# Patient Record
Sex: Female | Born: 1935 | Race: White | Hispanic: No | Smoking: Never smoker
Health system: Southern US, Community
[De-identification: ages and names within clinical notes are randomized; demographics above are authoritative.]

## PROBLEM LIST (undated history)

## (undated) DIAGNOSIS — Z923 Personal history of irradiation: Secondary | ICD-10-CM

## (undated) DIAGNOSIS — C50919 Malignant neoplasm of unspecified site of unspecified female breast: Secondary | ICD-10-CM

## (undated) DIAGNOSIS — M1990 Unspecified osteoarthritis, unspecified site: Secondary | ICD-10-CM

## (undated) DIAGNOSIS — R600 Localized edema: Secondary | ICD-10-CM

## (undated) DIAGNOSIS — G4730 Sleep apnea, unspecified: Secondary | ICD-10-CM

## (undated) DIAGNOSIS — C801 Malignant (primary) neoplasm, unspecified: Secondary | ICD-10-CM

## (undated) DIAGNOSIS — Z9889 Other specified postprocedural states: Secondary | ICD-10-CM

## (undated) DIAGNOSIS — I10 Essential (primary) hypertension: Secondary | ICD-10-CM

## (undated) DIAGNOSIS — R0600 Dyspnea, unspecified: Secondary | ICD-10-CM

## (undated) DIAGNOSIS — I2510 Atherosclerotic heart disease of native coronary artery without angina pectoris: Secondary | ICD-10-CM

## (undated) DIAGNOSIS — N289 Disorder of kidney and ureter, unspecified: Secondary | ICD-10-CM

## (undated) DIAGNOSIS — D649 Anemia, unspecified: Secondary | ICD-10-CM

## (undated) DIAGNOSIS — K219 Gastro-esophageal reflux disease without esophagitis: Secondary | ICD-10-CM

## (undated) HISTORY — DX: Other specified postprocedural states: Z98.890

## (undated) HISTORY — PX: BREAST LUMPECTOMY: SHX2

## (undated) HISTORY — DX: Essential (primary) hypertension: I10

## (undated) HISTORY — PX: ABDOMINAL HYSTERECTOMY: SHX81

## (undated) HISTORY — DX: Gastro-esophageal reflux disease without esophagitis: K21.9

## (undated) HISTORY — DX: Disorder of kidney and ureter, unspecified: N28.9

## (undated) HISTORY — PX: CAROTID STENT: SHX1301

## (undated) HISTORY — DX: Anemia, unspecified: D64.9

## (undated) HISTORY — DX: Unspecified osteoarthritis, unspecified site: M19.90

## (undated) HISTORY — PX: MASTECTOMY: SHX3

---

## 1959-05-24 DIAGNOSIS — C801 Malignant (primary) neoplasm, unspecified: Secondary | ICD-10-CM

## 1959-05-24 HISTORY — DX: Malignant (primary) neoplasm, unspecified: C80.1

## 1959-05-24 HISTORY — PX: PARTIAL HYSTERECTOMY: SHX80

## 2004-03-25 ENCOUNTER — Emergency Department: Payer: Self-pay | Admitting: Emergency Medicine

## 2004-08-16 ENCOUNTER — Ambulatory Visit: Payer: Self-pay

## 2004-12-28 ENCOUNTER — Ambulatory Visit: Payer: Self-pay | Admitting: Family Medicine

## 2006-03-09 ENCOUNTER — Ambulatory Visit: Payer: Self-pay | Admitting: Family Medicine

## 2007-04-12 ENCOUNTER — Ambulatory Visit: Payer: Self-pay | Admitting: Family Medicine

## 2007-05-08 ENCOUNTER — Ambulatory Visit: Payer: Self-pay | Admitting: Family Medicine

## 2007-05-24 HISTORY — PX: CATARACT EXTRACTION W/ INTRAOCULAR LENS  IMPLANT, BILATERAL: SHX1307

## 2007-05-24 HISTORY — PX: EYE SURGERY: SHX253

## 2007-06-14 ENCOUNTER — Other Ambulatory Visit: Payer: Self-pay

## 2007-06-14 ENCOUNTER — Inpatient Hospital Stay: Payer: Self-pay | Admitting: Internal Medicine

## 2007-09-18 ENCOUNTER — Ambulatory Visit: Payer: Self-pay | Admitting: Family Medicine

## 2007-11-13 ENCOUNTER — Ambulatory Visit: Payer: Self-pay | Admitting: Family Medicine

## 2008-02-07 ENCOUNTER — Ambulatory Visit: Payer: Self-pay | Admitting: Cardiology

## 2008-02-07 ENCOUNTER — Ambulatory Visit: Payer: Self-pay | Admitting: Ophthalmology

## 2008-02-07 ENCOUNTER — Other Ambulatory Visit: Payer: Self-pay

## 2008-02-19 ENCOUNTER — Ambulatory Visit: Payer: Self-pay | Admitting: Ophthalmology

## 2008-03-19 ENCOUNTER — Ambulatory Visit: Payer: Self-pay | Admitting: Ophthalmology

## 2008-04-01 ENCOUNTER — Ambulatory Visit: Payer: Self-pay | Admitting: Ophthalmology

## 2009-03-31 ENCOUNTER — Ambulatory Visit: Payer: Self-pay | Admitting: Family Medicine

## 2009-09-15 ENCOUNTER — Ambulatory Visit: Payer: Self-pay | Admitting: Gastroenterology

## 2009-09-20 ENCOUNTER — Ambulatory Visit: Payer: Self-pay | Admitting: Internal Medicine

## 2009-10-08 ENCOUNTER — Ambulatory Visit: Payer: Self-pay | Admitting: Gastroenterology

## 2009-10-16 ENCOUNTER — Ambulatory Visit: Payer: Self-pay | Admitting: Internal Medicine

## 2009-10-21 ENCOUNTER — Ambulatory Visit: Payer: Self-pay | Admitting: Internal Medicine

## 2009-11-20 ENCOUNTER — Ambulatory Visit: Payer: Self-pay | Admitting: Internal Medicine

## 2010-04-22 ENCOUNTER — Ambulatory Visit: Payer: Self-pay | Admitting: Internal Medicine

## 2010-08-05 ENCOUNTER — Ambulatory Visit: Payer: Self-pay | Admitting: Internal Medicine

## 2010-10-11 ENCOUNTER — Ambulatory Visit: Payer: Self-pay | Admitting: Internal Medicine

## 2010-10-29 ENCOUNTER — Ambulatory Visit: Payer: Self-pay | Admitting: Gastroenterology

## 2010-11-04 ENCOUNTER — Ambulatory Visit: Payer: Self-pay | Admitting: Gastroenterology

## 2011-05-25 ENCOUNTER — Ambulatory Visit: Payer: Self-pay | Admitting: Internal Medicine

## 2012-06-26 ENCOUNTER — Ambulatory Visit: Payer: Self-pay | Admitting: Internal Medicine

## 2013-07-22 ENCOUNTER — Ambulatory Visit: Payer: Self-pay | Admitting: Internal Medicine

## 2013-08-02 ENCOUNTER — Ambulatory Visit: Payer: Self-pay | Admitting: Internal Medicine

## 2013-11-01 DIAGNOSIS — D649 Anemia, unspecified: Secondary | ICD-10-CM | POA: Insufficient documentation

## 2013-11-01 DIAGNOSIS — R609 Edema, unspecified: Secondary | ICD-10-CM | POA: Insufficient documentation

## 2013-11-01 DIAGNOSIS — R51 Headache: Secondary | ICD-10-CM

## 2013-11-01 DIAGNOSIS — Z90710 Acquired absence of both cervix and uterus: Secondary | ICD-10-CM | POA: Insufficient documentation

## 2013-11-01 DIAGNOSIS — G8929 Other chronic pain: Secondary | ICD-10-CM | POA: Insufficient documentation

## 2013-11-01 DIAGNOSIS — I10 Essential (primary) hypertension: Secondary | ICD-10-CM | POA: Insufficient documentation

## 2013-11-01 DIAGNOSIS — B0229 Other postherpetic nervous system involvement: Secondary | ICD-10-CM | POA: Insufficient documentation

## 2013-11-01 DIAGNOSIS — M069 Rheumatoid arthritis, unspecified: Secondary | ICD-10-CM | POA: Insufficient documentation

## 2013-11-01 DIAGNOSIS — E785 Hyperlipidemia, unspecified: Secondary | ICD-10-CM | POA: Insufficient documentation

## 2013-11-01 DIAGNOSIS — K219 Gastro-esophageal reflux disease without esophagitis: Secondary | ICD-10-CM | POA: Insufficient documentation

## 2013-11-01 DIAGNOSIS — Z87440 Personal history of urinary (tract) infections: Secondary | ICD-10-CM | POA: Insufficient documentation

## 2013-11-01 DIAGNOSIS — R600 Localized edema: Secondary | ICD-10-CM | POA: Insufficient documentation

## 2013-11-01 DIAGNOSIS — M0579 Rheumatoid arthritis with rheumatoid factor of multiple sites without organ or systems involvement: Secondary | ICD-10-CM | POA: Insufficient documentation

## 2013-12-27 ENCOUNTER — Ambulatory Visit: Payer: Self-pay | Admitting: Nephrology

## 2014-02-04 ENCOUNTER — Ambulatory Visit: Payer: Self-pay | Admitting: Internal Medicine

## 2014-02-06 DIAGNOSIS — R51 Headache: Secondary | ICD-10-CM

## 2014-02-06 DIAGNOSIS — R200 Anesthesia of skin: Secondary | ICD-10-CM | POA: Insufficient documentation

## 2014-02-06 DIAGNOSIS — R519 Headache, unspecified: Secondary | ICD-10-CM | POA: Insufficient documentation

## 2014-03-06 DIAGNOSIS — G44221 Chronic tension-type headache, intractable: Secondary | ICD-10-CM | POA: Insufficient documentation

## 2014-04-30 ENCOUNTER — Ambulatory Visit: Payer: Self-pay | Admitting: Vascular Surgery

## 2014-05-09 DIAGNOSIS — N184 Chronic kidney disease, stage 4 (severe): Secondary | ICD-10-CM | POA: Insufficient documentation

## 2014-05-09 DIAGNOSIS — R0789 Other chest pain: Secondary | ICD-10-CM | POA: Insufficient documentation

## 2014-05-22 ENCOUNTER — Ambulatory Visit: Payer: Self-pay | Admitting: Cardiology

## 2014-05-23 HISTORY — PX: CAROTID STENT: SHX1301

## 2014-05-27 ENCOUNTER — Inpatient Hospital Stay: Payer: Self-pay | Admitting: Vascular Surgery

## 2014-05-27 DIAGNOSIS — E785 Hyperlipidemia, unspecified: Secondary | ICD-10-CM | POA: Diagnosis not present

## 2014-05-27 DIAGNOSIS — Z8673 Personal history of transient ischemic attack (TIA), and cerebral infarction without residual deficits: Secondary | ICD-10-CM | POA: Diagnosis not present

## 2014-05-27 DIAGNOSIS — Z7982 Long term (current) use of aspirin: Secondary | ICD-10-CM | POA: Diagnosis not present

## 2014-05-27 DIAGNOSIS — Z882 Allergy status to sulfonamides status: Secondary | ICD-10-CM | POA: Diagnosis not present

## 2014-05-27 DIAGNOSIS — I6522 Occlusion and stenosis of left carotid artery: Secondary | ICD-10-CM | POA: Diagnosis not present

## 2014-05-27 DIAGNOSIS — I2510 Atherosclerotic heart disease of native coronary artery without angina pectoris: Secondary | ICD-10-CM | POA: Diagnosis not present

## 2014-05-27 DIAGNOSIS — I959 Hypotension, unspecified: Secondary | ICD-10-CM | POA: Diagnosis not present

## 2014-05-27 DIAGNOSIS — I6529 Occlusion and stenosis of unspecified carotid artery: Secondary | ICD-10-CM | POA: Diagnosis not present

## 2014-05-27 DIAGNOSIS — Z885 Allergy status to narcotic agent status: Secondary | ICD-10-CM | POA: Diagnosis not present

## 2014-05-27 DIAGNOSIS — I639 Cerebral infarction, unspecified: Secondary | ICD-10-CM | POA: Diagnosis not present

## 2014-05-27 DIAGNOSIS — I129 Hypertensive chronic kidney disease with stage 1 through stage 4 chronic kidney disease, or unspecified chronic kidney disease: Secondary | ICD-10-CM | POA: Diagnosis not present

## 2014-05-27 LAB — BASIC METABOLIC PANEL
ANION GAP: 9 (ref 7–16)
BUN: 35 mg/dL — ABNORMAL HIGH (ref 7–18)
CO2: 24 mmol/L (ref 21–32)
Calcium, Total: 8.2 mg/dL — ABNORMAL LOW (ref 8.5–10.1)
Chloride: 106 mmol/L (ref 98–107)
Creatinine: 2.09 mg/dL — ABNORMAL HIGH (ref 0.60–1.30)
EGFR (African American): 30 — ABNORMAL LOW
EGFR (Non-African Amer.): 24 — ABNORMAL LOW
Glucose: 89 mg/dL (ref 65–99)
Osmolality: 285 (ref 275–301)
Potassium: 4.6 mmol/L (ref 3.5–5.1)
Sodium: 139 mmol/L (ref 136–145)

## 2014-05-28 LAB — CBC WITH DIFFERENTIAL/PLATELET
Basophil #: 0 10*3/uL (ref 0.0–0.1)
Basophil %: 0.6 %
EOS PCT: 2 %
Eosinophil #: 0.1 10*3/uL (ref 0.0–0.7)
HCT: 25.3 % — AB (ref 35.0–47.0)
HGB: 8.1 g/dL — ABNORMAL LOW (ref 12.0–16.0)
Lymphocyte #: 1 10*3/uL (ref 1.0–3.6)
Lymphocyte %: 14.3 %
MCH: 28.1 pg (ref 26.0–34.0)
MCHC: 31.9 g/dL — AB (ref 32.0–36.0)
MCV: 88 fL (ref 80–100)
MONOS PCT: 7.8 %
Monocyte #: 0.5 x10 3/mm (ref 0.2–0.9)
NEUTROS ABS: 5 10*3/uL (ref 1.4–6.5)
Neutrophil %: 75.3 %
Platelet: 194 10*3/uL (ref 150–440)
RBC: 2.88 10*6/uL — ABNORMAL LOW (ref 3.80–5.20)
RDW: 15 % — AB (ref 11.5–14.5)
WBC: 6.7 10*3/uL (ref 3.6–11.0)

## 2014-05-28 LAB — BASIC METABOLIC PANEL
ANION GAP: 7 (ref 7–16)
BUN: 27 mg/dL — AB (ref 7–18)
Calcium, Total: 7.4 mg/dL — ABNORMAL LOW (ref 8.5–10.1)
Chloride: 106 mmol/L (ref 98–107)
Co2: 29 mmol/L (ref 21–32)
Creatinine: 1.91 mg/dL — ABNORMAL HIGH (ref 0.60–1.30)
EGFR (Non-African Amer.): 27 — ABNORMAL LOW
GFR CALC AF AMER: 33 — AB
Glucose: 99 mg/dL (ref 65–99)
OSMOLALITY: 288 (ref 275–301)
Potassium: 4.5 mmol/L (ref 3.5–5.1)
Sodium: 142 mmol/L (ref 136–145)

## 2014-05-28 LAB — PROTIME-INR
INR: 1.2
PROTHROMBIN TIME: 15.4 s — AB (ref 11.5–14.7)

## 2014-05-28 LAB — APTT: Activated PTT: 31.6 secs (ref 23.6–35.9)

## 2014-06-02 DIAGNOSIS — I10 Essential (primary) hypertension: Secondary | ICD-10-CM | POA: Diagnosis not present

## 2014-06-02 DIAGNOSIS — R0789 Other chest pain: Secondary | ICD-10-CM | POA: Diagnosis not present

## 2014-06-02 DIAGNOSIS — I779 Disorder of arteries and arterioles, unspecified: Secondary | ICD-10-CM | POA: Diagnosis not present

## 2014-06-02 DIAGNOSIS — E782 Mixed hyperlipidemia: Secondary | ICD-10-CM | POA: Diagnosis not present

## 2014-06-06 DIAGNOSIS — E538 Deficiency of other specified B group vitamins: Secondary | ICD-10-CM | POA: Diagnosis not present

## 2014-07-08 DIAGNOSIS — E538 Deficiency of other specified B group vitamins: Secondary | ICD-10-CM | POA: Diagnosis not present

## 2014-07-09 DIAGNOSIS — G479 Sleep disorder, unspecified: Secondary | ICD-10-CM | POA: Diagnosis not present

## 2014-07-09 DIAGNOSIS — R5382 Chronic fatigue, unspecified: Secondary | ICD-10-CM | POA: Insufficient documentation

## 2014-07-09 DIAGNOSIS — R51 Headache: Secondary | ICD-10-CM | POA: Diagnosis not present

## 2014-07-09 DIAGNOSIS — I6522 Occlusion and stenosis of left carotid artery: Secondary | ICD-10-CM | POA: Diagnosis not present

## 2014-07-14 DIAGNOSIS — N2581 Secondary hyperparathyroidism of renal origin: Secondary | ICD-10-CM | POA: Diagnosis not present

## 2014-07-14 DIAGNOSIS — R809 Proteinuria, unspecified: Secondary | ICD-10-CM | POA: Diagnosis not present

## 2014-07-14 DIAGNOSIS — I10 Essential (primary) hypertension: Secondary | ICD-10-CM | POA: Diagnosis not present

## 2014-07-14 DIAGNOSIS — D631 Anemia in chronic kidney disease: Secondary | ICD-10-CM | POA: Diagnosis not present

## 2014-07-14 DIAGNOSIS — N184 Chronic kidney disease, stage 4 (severe): Secondary | ICD-10-CM | POA: Diagnosis not present

## 2014-07-18 DIAGNOSIS — K219 Gastro-esophageal reflux disease without esophagitis: Secondary | ICD-10-CM | POA: Diagnosis not present

## 2014-07-18 DIAGNOSIS — D649 Anemia, unspecified: Secondary | ICD-10-CM | POA: Diagnosis not present

## 2014-07-18 DIAGNOSIS — R531 Weakness: Secondary | ICD-10-CM | POA: Diagnosis not present

## 2014-07-18 DIAGNOSIS — N184 Chronic kidney disease, stage 4 (severe): Secondary | ICD-10-CM | POA: Diagnosis not present

## 2014-08-01 DIAGNOSIS — D649 Anemia, unspecified: Secondary | ICD-10-CM | POA: Diagnosis not present

## 2014-08-12 DIAGNOSIS — I779 Disorder of arteries and arterioles, unspecified: Secondary | ICD-10-CM | POA: Diagnosis not present

## 2014-08-12 DIAGNOSIS — D508 Other iron deficiency anemias: Secondary | ICD-10-CM | POA: Diagnosis not present

## 2014-08-12 DIAGNOSIS — R5382 Chronic fatigue, unspecified: Secondary | ICD-10-CM | POA: Diagnosis not present

## 2014-08-12 DIAGNOSIS — Z79899 Other long term (current) drug therapy: Secondary | ICD-10-CM | POA: Diagnosis not present

## 2014-08-12 DIAGNOSIS — I10 Essential (primary) hypertension: Secondary | ICD-10-CM | POA: Diagnosis not present

## 2014-08-12 DIAGNOSIS — E538 Deficiency of other specified B group vitamins: Secondary | ICD-10-CM | POA: Diagnosis not present

## 2014-08-12 DIAGNOSIS — G44221 Chronic tension-type headache, intractable: Secondary | ICD-10-CM | POA: Diagnosis not present

## 2014-08-12 DIAGNOSIS — G479 Sleep disorder, unspecified: Secondary | ICD-10-CM | POA: Diagnosis not present

## 2014-09-12 DIAGNOSIS — E538 Deficiency of other specified B group vitamins: Secondary | ICD-10-CM | POA: Diagnosis not present

## 2014-09-21 NOTE — Op Note (Signed)
PATIENT NAME:  Rachel Jones, Rachel Jones MR#:  [id number] DATE OF BIRTH:  1933/12/19  DATE OF PROCEDURE:  05/27/2014  PREOPERATIVE DIAGNOSES:  1.  Critical stenosis of the left internal carotid artery, symptomatic.  2.  History of cerebrovascular accident approximately 2 months ago.   POSTOPERATIVE DIAGNOSES:  1.  Critical stenosis of the left internal carotid artery, symptomatic.  2.  History of cerebrovascular accident approximately 2 months ago.   PROCEDURES PERFORMED: 1.  Arch aortogram.  2.  Selective left carotid artery angiography, both cervical and cerebral views.  3.  Percutaneous transluminal angioplasty and stent placement, left internal carotid artery.   SURGEON: Rachel Pilar, MD  ASSISTANT: Dr. Lucky Jones  SEDATION: Versed 1 mg plus fentanyl 50 mcg administered IV. Continuous ECG, pulse oximetry and cardiopulmonary monitoring is performed throughout the entire procedure by the interventional radiology nurse. Total sedation time is approximately 1 hour.   ACCESS: A 6-French sheath, right common femoral artery.   CONTRAST USED: Isovue 50 mL.   FLUOROSCOPY TIME: 10.9 minutes.   INDICATIONS: Rachel Jones is a 81 year old woman who presented to [facility] approximately 2 months ago with stroke symptoms. Confirmation by MRI for left hemispheric CVA was made and work-up demonstrated critical stenosis of the left internal carotid artery. The risks and benefits for angiography in the hope for intervention given her symptomatic status, her poor cardiac status, poor pulmonary status and poor renal status was reviewed. All questions have been answered. Noncontrasted CT scan has been used to evaluate the left carotid and there is minimal calcifications suggesting the lesion should be amenable to intervention. Again, risks and benefits were reviewed with the patient and family. All alternative therapies were discussed. All questions have been answered. The patient has agreed to proceed with angiography and  stenting.   DESCRIPTION OF PROCEDURE: The patient is taken to special procedures and placed in the supine position. After adequate monitors are placed, she is positioned supine and her groins are prepped and draped in a sterile fashion. Appropriate timeout is called.   Right groin is then evaluated with ultrasound. Common femoral artery is identified. It is echolucent and pulsatile indicating patency. Image is recorded for the permanent record and under real-time visualization, after 1% lidocaine has been infiltrated, a microneedle is inserted, microwire followed by microsheath, J-wire followed by 5-French sheath and 5-French pigtail catheter. Pigtail catheter is advanced into the ascending aorta and a LAO projection of the arch is obtained. After review of the images, the pigtail catheter is exchanged over a stiff angled Glidewire for a JB-1 catheter and the left common carotid is engaged. Wire and catheter are then advanced into the distal common carotid and an oblique image is obtained to demonstrate the external carotid artery. Then 6000 units of heparin is given. After 5 minutes an ACT was done, which was 271, and therefore an extra 1000 units of heparin was given.   Wire and catheter are then negotiated into the external carotid artery and the Glidewire is exchanged for an Amplatz Super Stiff wire. Catheter and 5-French sheath are removed, and a 6-French shuttle sheath is then advanced with its tip approximately 2 cm below the bifurcation. Both cervical and cerebral imaging is obtained in the lateral and AP by hand injection. After review of these images, the anterior image is selected with slight caudad angulation and subsequently the large NAV-6 Emboshield is advanced through the lesion and into the internal carotid artery. It is positioned distally around the second curve. Magnified imaging is then  obtained so that the tip of the sheath and the Emboshield are both in the field of view. A 9 x 7 tapered  40 mm Xact stent is then advanced over the wire and positioned across the lesion. It is deployed without difficulty and then postdilated with a 5 x 2 balloon. Inflation was to 10 atmospheres for just a few seconds.   The filter is then retrieved without difficulty and imaging, both cervical and cerebral, is again obtained. The sheath is then pulled down into the external iliac on the right. Oblique view is obtained and a StarClose device deployed successfully, and there are no immediate complications. There were no neurological changes.   INTERPRETATION: Arch is opacified with a bolus injection of contrast and demonstrates a fairly normal anatomy. No evidence of ostial stenosis of the great vessels. There is moderate tortuosity of the left common.   Imaging of the carotid artery and the intracranials demonstrate that the lesion is best seen in the AP with a little bit of caudad angulation. It is approximately 99%. There is a small string sign noted. There is marked redundancy of the internal carotid artery. There does not appear to be any significant intracranial stenoses or lesions. There is poor filling of the anterior tibial prior to angioplasty. The external carotid appears normal as does the bulb and common carotid.   Following stent placement and post dilatation, there is a residual 40% stenosis; however, there is significant improvement in flow. There is no evidence of kinking and in fact there is a significant improvement in the filling of the anterior cerebral.   SUMMARY: Successful angioplasty and stent placement, left internal carotid artery, as described above. ____________________________ Rachel Cabal, MD ggs:sb D: 05/27/2014 09:40:57 ET T: 05/27/2014 11:30:26 ET JOB#: [id number]  cc: Rachel Cabal, MD, <Dictator> Rachel Jones. Rachel Glassing, MD Rachel Douglas Doy Hutching, MD Rachel Public Melrose Nakayama, MD Rachel Cabal MD ELECTRONICALLY SIGNED 06/03/2014 17:33

## 2014-09-29 DIAGNOSIS — I639 Cerebral infarction, unspecified: Secondary | ICD-10-CM | POA: Diagnosis not present

## 2014-09-29 DIAGNOSIS — I10 Essential (primary) hypertension: Secondary | ICD-10-CM | POA: Diagnosis not present

## 2014-09-29 DIAGNOSIS — I6529 Occlusion and stenosis of unspecified carotid artery: Secondary | ICD-10-CM | POA: Diagnosis not present

## 2014-09-29 DIAGNOSIS — E669 Obesity, unspecified: Secondary | ICD-10-CM | POA: Diagnosis not present

## 2014-09-29 DIAGNOSIS — E785 Hyperlipidemia, unspecified: Secondary | ICD-10-CM | POA: Diagnosis not present

## 2014-10-07 DIAGNOSIS — I10 Essential (primary) hypertension: Secondary | ICD-10-CM | POA: Diagnosis not present

## 2014-10-07 DIAGNOSIS — D508 Other iron deficiency anemias: Secondary | ICD-10-CM | POA: Diagnosis not present

## 2014-10-07 DIAGNOSIS — Z79899 Other long term (current) drug therapy: Secondary | ICD-10-CM | POA: Diagnosis not present

## 2014-10-13 DIAGNOSIS — D631 Anemia in chronic kidney disease: Secondary | ICD-10-CM | POA: Diagnosis not present

## 2014-10-13 DIAGNOSIS — R809 Proteinuria, unspecified: Secondary | ICD-10-CM | POA: Diagnosis not present

## 2014-10-13 DIAGNOSIS — E875 Hyperkalemia: Secondary | ICD-10-CM | POA: Diagnosis not present

## 2014-10-13 DIAGNOSIS — N184 Chronic kidney disease, stage 4 (severe): Secondary | ICD-10-CM | POA: Diagnosis not present

## 2014-10-13 DIAGNOSIS — I129 Hypertensive chronic kidney disease with stage 1 through stage 4 chronic kidney disease, or unspecified chronic kidney disease: Secondary | ICD-10-CM | POA: Diagnosis not present

## 2014-10-13 DIAGNOSIS — N2581 Secondary hyperparathyroidism of renal origin: Secondary | ICD-10-CM | POA: Diagnosis not present

## 2014-10-15 DIAGNOSIS — E538 Deficiency of other specified B group vitamins: Secondary | ICD-10-CM | POA: Diagnosis not present

## 2014-10-28 DIAGNOSIS — E782 Mixed hyperlipidemia: Secondary | ICD-10-CM | POA: Diagnosis not present

## 2014-10-28 DIAGNOSIS — D508 Other iron deficiency anemias: Secondary | ICD-10-CM | POA: Diagnosis not present

## 2014-10-28 DIAGNOSIS — Z87440 Personal history of urinary (tract) infections: Secondary | ICD-10-CM | POA: Diagnosis not present

## 2014-10-28 DIAGNOSIS — I10 Essential (primary) hypertension: Secondary | ICD-10-CM | POA: Diagnosis not present

## 2014-10-29 ENCOUNTER — Other Ambulatory Visit: Payer: Self-pay | Admitting: Internal Medicine

## 2014-10-29 DIAGNOSIS — Z1239 Encounter for other screening for malignant neoplasm of breast: Secondary | ICD-10-CM

## 2014-10-29 DIAGNOSIS — R921 Mammographic calcification found on diagnostic imaging of breast: Secondary | ICD-10-CM

## 2014-11-06 DIAGNOSIS — G479 Sleep disorder, unspecified: Secondary | ICD-10-CM | POA: Diagnosis not present

## 2014-11-06 DIAGNOSIS — R5382 Chronic fatigue, unspecified: Secondary | ICD-10-CM | POA: Diagnosis not present

## 2014-11-06 DIAGNOSIS — G44221 Chronic tension-type headache, intractable: Secondary | ICD-10-CM | POA: Diagnosis not present

## 2014-11-13 ENCOUNTER — Other Ambulatory Visit: Payer: Self-pay | Admitting: Internal Medicine

## 2014-11-13 ENCOUNTER — Ambulatory Visit: Payer: Self-pay

## 2014-11-13 ENCOUNTER — Ambulatory Visit
Admission: RE | Admit: 2014-11-13 | Discharge: 2014-11-13 | Disposition: A | Discharge status: Home / Self Care | Payer: Commercial Managed Care - HMO | Source: Ambulatory Visit | Attending: Internal Medicine | Admitting: Internal Medicine

## 2014-11-13 DIAGNOSIS — R921 Mammographic calcification found on diagnostic imaging of breast: Secondary | ICD-10-CM

## 2014-11-13 DIAGNOSIS — Z1231 Encounter for screening mammogram for malignant neoplasm of breast: Secondary | ICD-10-CM | POA: Insufficient documentation

## 2014-11-13 DIAGNOSIS — Z1239 Encounter for other screening for malignant neoplasm of breast: Secondary | ICD-10-CM

## 2014-11-13 DIAGNOSIS — R928 Other abnormal and inconclusive findings on diagnostic imaging of breast: Secondary | ICD-10-CM | POA: Diagnosis not present

## 2014-11-13 HISTORY — DX: Malignant (primary) neoplasm, unspecified: C80.1

## 2014-11-18 DIAGNOSIS — E538 Deficiency of other specified B group vitamins: Secondary | ICD-10-CM | POA: Diagnosis not present

## 2014-12-05 DIAGNOSIS — R531 Weakness: Secondary | ICD-10-CM | POA: Diagnosis not present

## 2014-12-05 DIAGNOSIS — I6522 Occlusion and stenosis of left carotid artery: Secondary | ICD-10-CM | POA: Diagnosis not present

## 2014-12-05 DIAGNOSIS — I10 Essential (primary) hypertension: Secondary | ICD-10-CM | POA: Diagnosis not present

## 2014-12-05 DIAGNOSIS — R0602 Shortness of breath: Secondary | ICD-10-CM | POA: Diagnosis not present

## 2014-12-08 DIAGNOSIS — R0602 Shortness of breath: Secondary | ICD-10-CM | POA: Diagnosis not present

## 2014-12-08 DIAGNOSIS — R531 Weakness: Secondary | ICD-10-CM | POA: Diagnosis not present

## 2014-12-12 DIAGNOSIS — I779 Disorder of arteries and arterioles, unspecified: Secondary | ICD-10-CM | POA: Diagnosis not present

## 2014-12-12 DIAGNOSIS — N390 Urinary tract infection, site not specified: Secondary | ICD-10-CM | POA: Diagnosis not present

## 2014-12-12 DIAGNOSIS — I6522 Occlusion and stenosis of left carotid artery: Secondary | ICD-10-CM | POA: Diagnosis not present

## 2014-12-12 DIAGNOSIS — I10 Essential (primary) hypertension: Secondary | ICD-10-CM | POA: Diagnosis not present

## 2014-12-18 DIAGNOSIS — R0602 Shortness of breath: Secondary | ICD-10-CM | POA: Insufficient documentation

## 2014-12-19 DIAGNOSIS — E538 Deficiency of other specified B group vitamins: Secondary | ICD-10-CM | POA: Diagnosis not present

## 2014-12-22 DIAGNOSIS — I6523 Occlusion and stenosis of bilateral carotid arteries: Secondary | ICD-10-CM | POA: Diagnosis not present

## 2014-12-22 DIAGNOSIS — I6522 Occlusion and stenosis of left carotid artery: Secondary | ICD-10-CM | POA: Diagnosis not present

## 2015-01-07 DIAGNOSIS — I10 Essential (primary) hypertension: Secondary | ICD-10-CM | POA: Diagnosis not present

## 2015-01-07 DIAGNOSIS — I6522 Occlusion and stenosis of left carotid artery: Secondary | ICD-10-CM | POA: Diagnosis not present

## 2015-01-07 DIAGNOSIS — I272 Other secondary pulmonary hypertension: Secondary | ICD-10-CM | POA: Diagnosis not present

## 2015-01-07 DIAGNOSIS — E782 Mixed hyperlipidemia: Secondary | ICD-10-CM | POA: Diagnosis not present

## 2015-01-09 DIAGNOSIS — N184 Chronic kidney disease, stage 4 (severe): Secondary | ICD-10-CM | POA: Diagnosis not present

## 2015-01-09 DIAGNOSIS — N189 Chronic kidney disease, unspecified: Secondary | ICD-10-CM | POA: Diagnosis not present

## 2015-01-09 DIAGNOSIS — I10 Essential (primary) hypertension: Secondary | ICD-10-CM | POA: Diagnosis not present

## 2015-01-09 DIAGNOSIS — N2581 Secondary hyperparathyroidism of renal origin: Secondary | ICD-10-CM | POA: Diagnosis not present

## 2015-01-09 DIAGNOSIS — D631 Anemia in chronic kidney disease: Secondary | ICD-10-CM | POA: Diagnosis not present

## 2015-01-19 DIAGNOSIS — E538 Deficiency of other specified B group vitamins: Secondary | ICD-10-CM | POA: Diagnosis not present

## 2015-01-19 DIAGNOSIS — D519 Vitamin B12 deficiency anemia, unspecified: Secondary | ICD-10-CM | POA: Diagnosis not present

## 2015-01-30 DIAGNOSIS — R0602 Shortness of breath: Secondary | ICD-10-CM | POA: Diagnosis not present

## 2015-02-02 DIAGNOSIS — E782 Mixed hyperlipidemia: Secondary | ICD-10-CM | POA: Diagnosis not present

## 2015-02-02 DIAGNOSIS — N184 Chronic kidney disease, stage 4 (severe): Secondary | ICD-10-CM | POA: Diagnosis not present

## 2015-02-02 DIAGNOSIS — Z23 Encounter for immunization: Secondary | ICD-10-CM | POA: Diagnosis not present

## 2015-02-02 DIAGNOSIS — Z79899 Other long term (current) drug therapy: Secondary | ICD-10-CM | POA: Diagnosis not present

## 2015-02-02 DIAGNOSIS — D508 Other iron deficiency anemias: Secondary | ICD-10-CM | POA: Diagnosis not present

## 2015-02-02 DIAGNOSIS — I10 Essential (primary) hypertension: Secondary | ICD-10-CM | POA: Diagnosis not present

## 2015-02-04 DIAGNOSIS — I6522 Occlusion and stenosis of left carotid artery: Secondary | ICD-10-CM | POA: Diagnosis not present

## 2015-02-04 DIAGNOSIS — E782 Mixed hyperlipidemia: Secondary | ICD-10-CM | POA: Diagnosis not present

## 2015-02-04 DIAGNOSIS — G4719 Other hypersomnia: Secondary | ICD-10-CM | POA: Diagnosis not present

## 2015-02-04 DIAGNOSIS — I10 Essential (primary) hypertension: Secondary | ICD-10-CM | POA: Diagnosis not present

## 2015-02-19 DIAGNOSIS — E538 Deficiency of other specified B group vitamins: Secondary | ICD-10-CM | POA: Diagnosis not present

## 2015-02-19 DIAGNOSIS — Z23 Encounter for immunization: Secondary | ICD-10-CM | POA: Diagnosis not present

## 2015-03-04 ENCOUNTER — Ambulatory Visit: Payer: Commercial Managed Care - HMO | Attending: Internal Medicine

## 2015-03-04 DIAGNOSIS — G4761 Periodic limb movement disorder: Secondary | ICD-10-CM | POA: Diagnosis not present

## 2015-03-04 DIAGNOSIS — G4730 Sleep apnea, unspecified: Secondary | ICD-10-CM | POA: Diagnosis not present

## 2015-03-04 DIAGNOSIS — G4733 Obstructive sleep apnea (adult) (pediatric): Secondary | ICD-10-CM | POA: Insufficient documentation

## 2015-03-23 DIAGNOSIS — E538 Deficiency of other specified B group vitamins: Secondary | ICD-10-CM | POA: Diagnosis not present

## 2015-04-02 DIAGNOSIS — I6523 Occlusion and stenosis of bilateral carotid arteries: Secondary | ICD-10-CM | POA: Diagnosis not present

## 2015-04-02 DIAGNOSIS — I639 Cerebral infarction, unspecified: Secondary | ICD-10-CM | POA: Diagnosis not present

## 2015-04-02 DIAGNOSIS — E785 Hyperlipidemia, unspecified: Secondary | ICD-10-CM | POA: Diagnosis not present

## 2015-04-02 DIAGNOSIS — I6529 Occlusion and stenosis of unspecified carotid artery: Secondary | ICD-10-CM | POA: Diagnosis not present

## 2015-04-02 DIAGNOSIS — G459 Transient cerebral ischemic attack, unspecified: Secondary | ICD-10-CM | POA: Diagnosis not present

## 2015-04-02 DIAGNOSIS — M184 Other bilateral secondary osteoarthritis of first carpometacarpal joints: Secondary | ICD-10-CM | POA: Diagnosis not present

## 2015-04-02 DIAGNOSIS — E669 Obesity, unspecified: Secondary | ICD-10-CM | POA: Diagnosis not present

## 2015-04-02 DIAGNOSIS — I10 Essential (primary) hypertension: Secondary | ICD-10-CM | POA: Diagnosis not present

## 2015-04-06 ENCOUNTER — Ambulatory Visit: Payer: Commercial Managed Care - HMO | Attending: Internal Medicine

## 2015-04-06 DIAGNOSIS — G4733 Obstructive sleep apnea (adult) (pediatric): Secondary | ICD-10-CM | POA: Insufficient documentation

## 2015-04-10 DIAGNOSIS — D631 Anemia in chronic kidney disease: Secondary | ICD-10-CM | POA: Diagnosis not present

## 2015-04-10 DIAGNOSIS — I129 Hypertensive chronic kidney disease with stage 1 through stage 4 chronic kidney disease, or unspecified chronic kidney disease: Secondary | ICD-10-CM | POA: Diagnosis not present

## 2015-04-10 DIAGNOSIS — N2581 Secondary hyperparathyroidism of renal origin: Secondary | ICD-10-CM | POA: Diagnosis not present

## 2015-04-10 DIAGNOSIS — E875 Hyperkalemia: Secondary | ICD-10-CM | POA: Diagnosis not present

## 2015-04-10 DIAGNOSIS — N184 Chronic kidney disease, stage 4 (severe): Secondary | ICD-10-CM | POA: Diagnosis not present

## 2015-04-10 DIAGNOSIS — R809 Proteinuria, unspecified: Secondary | ICD-10-CM | POA: Diagnosis not present

## 2015-05-04 DIAGNOSIS — E538 Deficiency of other specified B group vitamins: Secondary | ICD-10-CM | POA: Diagnosis not present

## 2015-05-04 DIAGNOSIS — I10 Essential (primary) hypertension: Secondary | ICD-10-CM | POA: Diagnosis not present

## 2015-05-04 DIAGNOSIS — D508 Other iron deficiency anemias: Secondary | ICD-10-CM | POA: Diagnosis not present

## 2015-05-04 DIAGNOSIS — M059 Rheumatoid arthritis with rheumatoid factor, unspecified: Secondary | ICD-10-CM | POA: Diagnosis not present

## 2015-05-04 DIAGNOSIS — N184 Chronic kidney disease, stage 4 (severe): Secondary | ICD-10-CM | POA: Diagnosis not present

## 2015-05-04 DIAGNOSIS — G479 Sleep disorder, unspecified: Secondary | ICD-10-CM | POA: Diagnosis not present

## 2015-05-26 DIAGNOSIS — I10 Essential (primary) hypertension: Secondary | ICD-10-CM | POA: Diagnosis not present

## 2015-05-26 DIAGNOSIS — J40 Bronchitis, not specified as acute or chronic: Secondary | ICD-10-CM | POA: Diagnosis not present

## 2015-05-26 DIAGNOSIS — N184 Chronic kidney disease, stage 4 (severe): Secondary | ICD-10-CM | POA: Diagnosis not present

## 2015-05-26 DIAGNOSIS — R05 Cough: Secondary | ICD-10-CM | POA: Diagnosis not present

## 2015-06-04 DIAGNOSIS — D508 Other iron deficiency anemias: Secondary | ICD-10-CM | POA: Diagnosis not present

## 2015-06-04 DIAGNOSIS — E538 Deficiency of other specified B group vitamins: Secondary | ICD-10-CM | POA: Diagnosis not present

## 2015-06-12 DIAGNOSIS — G4733 Obstructive sleep apnea (adult) (pediatric): Secondary | ICD-10-CM | POA: Diagnosis not present

## 2015-07-06 DIAGNOSIS — D508 Other iron deficiency anemias: Secondary | ICD-10-CM | POA: Diagnosis not present

## 2015-07-06 DIAGNOSIS — E538 Deficiency of other specified B group vitamins: Secondary | ICD-10-CM | POA: Diagnosis not present

## 2015-07-08 DIAGNOSIS — M059 Rheumatoid arthritis with rheumatoid factor, unspecified: Secondary | ICD-10-CM | POA: Diagnosis not present

## 2015-07-08 DIAGNOSIS — R05 Cough: Secondary | ICD-10-CM | POA: Diagnosis not present

## 2015-07-08 DIAGNOSIS — R51 Headache: Secondary | ICD-10-CM | POA: Diagnosis not present

## 2015-07-08 DIAGNOSIS — I2510 Atherosclerotic heart disease of native coronary artery without angina pectoris: Secondary | ICD-10-CM | POA: Diagnosis not present

## 2015-07-08 DIAGNOSIS — M069 Rheumatoid arthritis, unspecified: Secondary | ICD-10-CM | POA: Diagnosis not present

## 2015-07-08 DIAGNOSIS — J101 Influenza due to other identified influenza virus with other respiratory manifestations: Secondary | ICD-10-CM | POA: Diagnosis not present

## 2015-07-08 DIAGNOSIS — R6883 Chills (without fever): Secondary | ICD-10-CM | POA: Diagnosis not present

## 2015-07-13 DIAGNOSIS — G4733 Obstructive sleep apnea (adult) (pediatric): Secondary | ICD-10-CM | POA: Diagnosis not present

## 2015-07-17 DIAGNOSIS — R809 Proteinuria, unspecified: Secondary | ICD-10-CM | POA: Diagnosis not present

## 2015-07-17 DIAGNOSIS — D631 Anemia in chronic kidney disease: Secondary | ICD-10-CM | POA: Diagnosis not present

## 2015-07-17 DIAGNOSIS — N2581 Secondary hyperparathyroidism of renal origin: Secondary | ICD-10-CM | POA: Diagnosis not present

## 2015-07-17 DIAGNOSIS — I129 Hypertensive chronic kidney disease with stage 1 through stage 4 chronic kidney disease, or unspecified chronic kidney disease: Secondary | ICD-10-CM | POA: Diagnosis not present

## 2015-07-17 DIAGNOSIS — N184 Chronic kidney disease, stage 4 (severe): Secondary | ICD-10-CM | POA: Diagnosis not present

## 2015-08-06 DIAGNOSIS — D508 Other iron deficiency anemias: Secondary | ICD-10-CM | POA: Diagnosis not present

## 2015-08-06 DIAGNOSIS — I779 Disorder of arteries and arterioles, unspecified: Secondary | ICD-10-CM | POA: Diagnosis not present

## 2015-08-06 DIAGNOSIS — N184 Chronic kidney disease, stage 4 (severe): Secondary | ICD-10-CM | POA: Diagnosis not present

## 2015-08-06 DIAGNOSIS — Z79899 Other long term (current) drug therapy: Secondary | ICD-10-CM | POA: Diagnosis not present

## 2015-08-06 DIAGNOSIS — E538 Deficiency of other specified B group vitamins: Secondary | ICD-10-CM | POA: Diagnosis not present

## 2015-08-06 DIAGNOSIS — E782 Mixed hyperlipidemia: Secondary | ICD-10-CM | POA: Diagnosis not present

## 2015-08-06 DIAGNOSIS — I10 Essential (primary) hypertension: Secondary | ICD-10-CM | POA: Diagnosis not present

## 2015-08-07 DIAGNOSIS — R8290 Unspecified abnormal findings in urine: Secondary | ICD-10-CM | POA: Diagnosis not present

## 2015-08-10 DIAGNOSIS — G4733 Obstructive sleep apnea (adult) (pediatric): Secondary | ICD-10-CM | POA: Diagnosis not present

## 2015-08-13 DIAGNOSIS — R0602 Shortness of breath: Secondary | ICD-10-CM | POA: Diagnosis not present

## 2015-08-13 DIAGNOSIS — E782 Mixed hyperlipidemia: Secondary | ICD-10-CM | POA: Diagnosis not present

## 2015-08-13 DIAGNOSIS — G4733 Obstructive sleep apnea (adult) (pediatric): Secondary | ICD-10-CM | POA: Diagnosis not present

## 2015-08-13 DIAGNOSIS — I779 Disorder of arteries and arterioles, unspecified: Secondary | ICD-10-CM | POA: Diagnosis not present

## 2015-08-13 DIAGNOSIS — I10 Essential (primary) hypertension: Secondary | ICD-10-CM | POA: Diagnosis not present

## 2015-08-13 DIAGNOSIS — I6522 Occlusion and stenosis of left carotid artery: Secondary | ICD-10-CM | POA: Diagnosis not present

## 2015-08-13 DIAGNOSIS — N184 Chronic kidney disease, stage 4 (severe): Secondary | ICD-10-CM | POA: Diagnosis not present

## 2015-09-08 DIAGNOSIS — E538 Deficiency of other specified B group vitamins: Secondary | ICD-10-CM | POA: Diagnosis not present

## 2015-09-10 DIAGNOSIS — G4733 Obstructive sleep apnea (adult) (pediatric): Secondary | ICD-10-CM | POA: Diagnosis not present

## 2015-10-09 DIAGNOSIS — N2581 Secondary hyperparathyroidism of renal origin: Secondary | ICD-10-CM | POA: Diagnosis not present

## 2015-10-09 DIAGNOSIS — I129 Hypertensive chronic kidney disease with stage 1 through stage 4 chronic kidney disease, or unspecified chronic kidney disease: Secondary | ICD-10-CM | POA: Diagnosis not present

## 2015-10-09 DIAGNOSIS — E559 Vitamin D deficiency, unspecified: Secondary | ICD-10-CM | POA: Diagnosis not present

## 2015-10-09 DIAGNOSIS — N184 Chronic kidney disease, stage 4 (severe): Secondary | ICD-10-CM | POA: Diagnosis not present

## 2015-10-10 DIAGNOSIS — G4733 Obstructive sleep apnea (adult) (pediatric): Secondary | ICD-10-CM | POA: Diagnosis not present

## 2015-10-12 DIAGNOSIS — D508 Other iron deficiency anemias: Secondary | ICD-10-CM | POA: Diagnosis not present

## 2015-10-12 DIAGNOSIS — E538 Deficiency of other specified B group vitamins: Secondary | ICD-10-CM | POA: Diagnosis not present

## 2015-11-10 DIAGNOSIS — G4733 Obstructive sleep apnea (adult) (pediatric): Secondary | ICD-10-CM | POA: Diagnosis not present

## 2015-11-12 DIAGNOSIS — I10 Essential (primary) hypertension: Secondary | ICD-10-CM | POA: Diagnosis not present

## 2015-11-12 DIAGNOSIS — N390 Urinary tract infection, site not specified: Secondary | ICD-10-CM | POA: Diagnosis not present

## 2015-11-12 DIAGNOSIS — E538 Deficiency of other specified B group vitamins: Secondary | ICD-10-CM | POA: Diagnosis not present

## 2015-11-12 DIAGNOSIS — E782 Mixed hyperlipidemia: Secondary | ICD-10-CM | POA: Diagnosis not present

## 2015-11-12 DIAGNOSIS — Z79899 Other long term (current) drug therapy: Secondary | ICD-10-CM | POA: Diagnosis not present

## 2015-11-12 DIAGNOSIS — Z1239 Encounter for other screening for malignant neoplasm of breast: Secondary | ICD-10-CM | POA: Diagnosis not present

## 2015-11-12 DIAGNOSIS — D508 Other iron deficiency anemias: Secondary | ICD-10-CM | POA: Diagnosis not present

## 2015-11-12 DIAGNOSIS — M7989 Other specified soft tissue disorders: Secondary | ICD-10-CM | POA: Diagnosis not present

## 2015-11-12 DIAGNOSIS — N184 Chronic kidney disease, stage 4 (severe): Secondary | ICD-10-CM | POA: Diagnosis not present

## 2015-11-12 DIAGNOSIS — M25572 Pain in left ankle and joints of left foot: Secondary | ICD-10-CM | POA: Diagnosis not present

## 2015-12-10 DIAGNOSIS — G4733 Obstructive sleep apnea (adult) (pediatric): Secondary | ICD-10-CM | POA: Diagnosis not present

## 2015-12-15 DIAGNOSIS — E538 Deficiency of other specified B group vitamins: Secondary | ICD-10-CM | POA: Diagnosis not present

## 2015-12-15 DIAGNOSIS — R350 Frequency of micturition: Secondary | ICD-10-CM | POA: Diagnosis not present

## 2016-01-01 DIAGNOSIS — G4733 Obstructive sleep apnea (adult) (pediatric): Secondary | ICD-10-CM | POA: Diagnosis not present

## 2016-01-07 DIAGNOSIS — G4733 Obstructive sleep apnea (adult) (pediatric): Secondary | ICD-10-CM | POA: Diagnosis not present

## 2016-01-10 DIAGNOSIS — G4733 Obstructive sleep apnea (adult) (pediatric): Secondary | ICD-10-CM | POA: Diagnosis not present

## 2016-01-15 DIAGNOSIS — R300 Dysuria: Secondary | ICD-10-CM | POA: Diagnosis not present

## 2016-01-15 DIAGNOSIS — E538 Deficiency of other specified B group vitamins: Secondary | ICD-10-CM | POA: Diagnosis not present

## 2016-02-10 DIAGNOSIS — G4733 Obstructive sleep apnea (adult) (pediatric): Secondary | ICD-10-CM | POA: Diagnosis not present

## 2016-02-15 DIAGNOSIS — Z23 Encounter for immunization: Secondary | ICD-10-CM | POA: Diagnosis not present

## 2016-02-15 DIAGNOSIS — E782 Mixed hyperlipidemia: Secondary | ICD-10-CM | POA: Diagnosis not present

## 2016-02-15 DIAGNOSIS — E538 Deficiency of other specified B group vitamins: Secondary | ICD-10-CM | POA: Diagnosis not present

## 2016-02-15 DIAGNOSIS — I10 Essential (primary) hypertension: Secondary | ICD-10-CM | POA: Diagnosis not present

## 2016-02-15 DIAGNOSIS — I6522 Occlusion and stenosis of left carotid artery: Secondary | ICD-10-CM | POA: Diagnosis not present

## 2016-02-15 DIAGNOSIS — N184 Chronic kidney disease, stage 4 (severe): Secondary | ICD-10-CM | POA: Diagnosis not present

## 2016-02-15 DIAGNOSIS — R0602 Shortness of breath: Secondary | ICD-10-CM | POA: Diagnosis not present

## 2016-02-15 DIAGNOSIS — R0789 Other chest pain: Secondary | ICD-10-CM | POA: Diagnosis not present

## 2016-02-15 DIAGNOSIS — I779 Disorder of arteries and arterioles, unspecified: Secondary | ICD-10-CM | POA: Diagnosis not present

## 2016-02-15 DIAGNOSIS — Z79899 Other long term (current) drug therapy: Secondary | ICD-10-CM | POA: Diagnosis not present

## 2016-02-25 ENCOUNTER — Other Ambulatory Visit: Payer: Self-pay | Admitting: Internal Medicine

## 2016-02-25 DIAGNOSIS — R921 Mammographic calcification found on diagnostic imaging of breast: Secondary | ICD-10-CM

## 2016-02-25 DIAGNOSIS — Z1239 Encounter for other screening for malignant neoplasm of breast: Secondary | ICD-10-CM

## 2016-03-04 DIAGNOSIS — R0602 Shortness of breath: Secondary | ICD-10-CM | POA: Diagnosis not present

## 2016-03-04 DIAGNOSIS — R0789 Other chest pain: Secondary | ICD-10-CM | POA: Diagnosis not present

## 2016-03-11 DIAGNOSIS — G4733 Obstructive sleep apnea (adult) (pediatric): Secondary | ICD-10-CM | POA: Diagnosis not present

## 2016-03-14 DIAGNOSIS — E782 Mixed hyperlipidemia: Secondary | ICD-10-CM | POA: Diagnosis not present

## 2016-03-14 DIAGNOSIS — I779 Disorder of arteries and arterioles, unspecified: Secondary | ICD-10-CM | POA: Diagnosis not present

## 2016-03-14 DIAGNOSIS — I10 Essential (primary) hypertension: Secondary | ICD-10-CM | POA: Diagnosis not present

## 2016-03-14 DIAGNOSIS — I6522 Occlusion and stenosis of left carotid artery: Secondary | ICD-10-CM | POA: Diagnosis not present

## 2016-03-16 ENCOUNTER — Ambulatory Visit
Admission: RE | Admit: 2016-03-16 | Discharge: 2016-03-16 | Disposition: A | Discharge status: Home / Self Care | Payer: Commercial Managed Care - HMO | Source: Ambulatory Visit | Attending: Internal Medicine | Admitting: Internal Medicine

## 2016-03-16 DIAGNOSIS — R921 Mammographic calcification found on diagnostic imaging of breast: Secondary | ICD-10-CM

## 2016-03-16 DIAGNOSIS — Z1231 Encounter for screening mammogram for malignant neoplasm of breast: Secondary | ICD-10-CM | POA: Diagnosis not present

## 2016-03-16 DIAGNOSIS — Z1239 Encounter for other screening for malignant neoplasm of breast: Secondary | ICD-10-CM

## 2016-03-16 DIAGNOSIS — N6489 Other specified disorders of breast: Secondary | ICD-10-CM | POA: Diagnosis not present

## 2016-03-16 DIAGNOSIS — N6310 Unspecified lump in the right breast, unspecified quadrant: Secondary | ICD-10-CM | POA: Insufficient documentation

## 2016-03-16 DIAGNOSIS — R928 Other abnormal and inconclusive findings on diagnostic imaging of breast: Secondary | ICD-10-CM | POA: Diagnosis not present

## 2016-03-17 ENCOUNTER — Other Ambulatory Visit: Payer: Self-pay | Admitting: Internal Medicine

## 2016-03-17 DIAGNOSIS — E538 Deficiency of other specified B group vitamins: Secondary | ICD-10-CM | POA: Diagnosis not present

## 2016-03-17 DIAGNOSIS — N6310 Unspecified lump in the right breast, unspecified quadrant: Secondary | ICD-10-CM

## 2016-03-23 HISTORY — PX: BREAST BIOPSY: SHX20

## 2016-03-28 ENCOUNTER — Encounter: Payer: Self-pay | Admitting: Oncology

## 2016-03-28 ENCOUNTER — Ambulatory Visit
Admission: RE | Admit: 2016-03-28 | Discharge: 2016-03-28 | Disposition: A | Discharge status: Home / Self Care | Payer: Commercial Managed Care - HMO | Source: Ambulatory Visit | Attending: Internal Medicine | Admitting: Internal Medicine

## 2016-03-28 DIAGNOSIS — N6310 Unspecified lump in the right breast, unspecified quadrant: Secondary | ICD-10-CM

## 2016-03-28 DIAGNOSIS — C50211 Malignant neoplasm of upper-inner quadrant of right female breast: Secondary | ICD-10-CM | POA: Diagnosis not present

## 2016-03-28 DIAGNOSIS — N6312 Unspecified lump in the right breast, upper inner quadrant: Secondary | ICD-10-CM | POA: Diagnosis not present

## 2016-03-28 DIAGNOSIS — C50911 Malignant neoplasm of unspecified site of right female breast: Secondary | ICD-10-CM | POA: Insufficient documentation

## 2016-03-29 ENCOUNTER — Other Ambulatory Visit (INDEPENDENT_AMBULATORY_CARE_PROVIDER_SITE_OTHER): Payer: Self-pay | Admitting: Vascular Surgery

## 2016-03-29 DIAGNOSIS — I6523 Occlusion and stenosis of bilateral carotid arteries: Secondary | ICD-10-CM

## 2016-03-29 NOTE — Progress Notes (Signed)
  Oncology Nurse Navigator Documentation  Navigator Location: [facility]Med Onc (03/29/16 1700) Referral date to RadOnc/MedOnc: 03/31/16 (03/29/16 1700) )Navigator Encounter Type: Introductory phone call (03/29/16 1700)   Abnormal Finding Date: 03/16/16 (03/29/16 1700) Confirmed Diagnosis Date: 03/28/16 (03/29/16 1700)               Patient Visit Type: Initial (03/29/16 1700)                              Time Spent with Patient: 37 (03/29/16 1700)  Pathologist notified patient of positive right breast pathology result.  Introduced IT trainer. Scheduled patient for surgical consult with Dr. Tamala Julian, and Med/Onc consult  With Dr. Grayland Ormond on 03/31/16, at patient request Notified of appointment date and time .  Notified Misty at Dr. Doy Hutching office referral needed.

## 2016-03-31 ENCOUNTER — Inpatient Hospital Stay: Payer: Commercial Managed Care - HMO | Attending: Oncology | Admitting: Oncology

## 2016-03-31 ENCOUNTER — Encounter (INDEPENDENT_AMBULATORY_CARE_PROVIDER_SITE_OTHER): Payer: Self-pay

## 2016-03-31 ENCOUNTER — Encounter: Payer: Self-pay | Admitting: Oncology

## 2016-03-31 VITALS — BP 180/83 | HR 67 | Temp 97.6°F | Resp 18 | Wt 198.0 lb

## 2016-03-31 DIAGNOSIS — Z7982 Long term (current) use of aspirin: Secondary | ICD-10-CM | POA: Diagnosis not present

## 2016-03-31 DIAGNOSIS — M129 Arthropathy, unspecified: Secondary | ICD-10-CM

## 2016-03-31 DIAGNOSIS — C50211 Malignant neoplasm of upper-inner quadrant of right female breast: Secondary | ICD-10-CM | POA: Diagnosis not present

## 2016-03-31 DIAGNOSIS — Z79899 Other long term (current) drug therapy: Secondary | ICD-10-CM | POA: Diagnosis not present

## 2016-03-31 DIAGNOSIS — Z90710 Acquired absence of both cervix and uterus: Secondary | ICD-10-CM | POA: Diagnosis not present

## 2016-03-31 DIAGNOSIS — Z170 Estrogen receptor positive status [ER+]: Secondary | ICD-10-CM | POA: Insufficient documentation

## 2016-03-31 DIAGNOSIS — N189 Chronic kidney disease, unspecified: Secondary | ICD-10-CM | POA: Diagnosis not present

## 2016-03-31 DIAGNOSIS — I129 Hypertensive chronic kidney disease with stage 1 through stage 4 chronic kidney disease, or unspecified chronic kidney disease: Secondary | ICD-10-CM | POA: Insufficient documentation

## 2016-03-31 DIAGNOSIS — K219 Gastro-esophageal reflux disease without esophagitis: Secondary | ICD-10-CM | POA: Insufficient documentation

## 2016-03-31 DIAGNOSIS — D649 Anemia, unspecified: Secondary | ICD-10-CM

## 2016-03-31 DIAGNOSIS — Z8542 Personal history of malignant neoplasm of other parts of uterus: Secondary | ICD-10-CM | POA: Insufficient documentation

## 2016-03-31 NOTE — Progress Notes (Signed)
New evaluation for breast cancer. Complains of arthritis pain.

## 2016-03-31 NOTE — Progress Notes (Signed)
  Oncology Nurse Navigator Documentation  Navigator Location: [facility]c (03/31/16 1700) Referral date to RadOnc/MedOnc: 03/31/16 (03/31/16 1700) )Navigator Encounter Type: Initial MedOnc (03/31/16 1700)       Surgery Date: 04/26/16 (03/31/16 1700)             Patient Visit Type: MedOnc;Initial (03/31/16 1700) Treatment Phase: Pre-Tx/Tx Discussion (03/31/16 1700) Barriers/Navigation Needs: Education;Coordination of Care;Family concerns (03/31/16 1700)                          Time Spent with Patient: 90 (03/31/16 1700)  Met with patient , daughter at initial Med/Onc visit.  Given Breast Cancer treatment Handbook/folder with hospital services.  Patient reports she has carotid stents, and renal disease, and has concerns about treatment.  Dr. Grayland Ormond thoroughly discussed potential treament , explaining plan will be finalized based on surgical pathology.

## 2016-04-01 ENCOUNTER — Other Ambulatory Visit: Payer: Self-pay | Admitting: Surgery

## 2016-04-01 DIAGNOSIS — C50211 Malignant neoplasm of upper-inner quadrant of right female breast: Secondary | ICD-10-CM

## 2016-04-01 DIAGNOSIS — C50411 Malignant neoplasm of upper-outer quadrant of right female breast: Secondary | ICD-10-CM

## 2016-04-01 LAB — SURGICAL PATHOLOGY

## 2016-04-04 ENCOUNTER — Ambulatory Visit (INDEPENDENT_AMBULATORY_CARE_PROVIDER_SITE_OTHER): Payer: Commercial Managed Care - HMO

## 2016-04-04 ENCOUNTER — Encounter (INDEPENDENT_AMBULATORY_CARE_PROVIDER_SITE_OTHER): Payer: Self-pay | Admitting: Vascular Surgery

## 2016-04-04 ENCOUNTER — Ambulatory Visit (INDEPENDENT_AMBULATORY_CARE_PROVIDER_SITE_OTHER): Payer: Commercial Managed Care - HMO | Admitting: Vascular Surgery

## 2016-04-04 DIAGNOSIS — Z171 Estrogen receptor negative status [ER-]: Secondary | ICD-10-CM

## 2016-04-04 DIAGNOSIS — I890 Lymphedema, not elsewhere classified: Secondary | ICD-10-CM | POA: Diagnosis not present

## 2016-04-04 DIAGNOSIS — I6529 Occlusion and stenosis of unspecified carotid artery: Secondary | ICD-10-CM | POA: Insufficient documentation

## 2016-04-04 DIAGNOSIS — I6523 Occlusion and stenosis of bilateral carotid arteries: Secondary | ICD-10-CM

## 2016-04-04 DIAGNOSIS — C50211 Malignant neoplasm of upper-inner quadrant of right female breast: Secondary | ICD-10-CM | POA: Insufficient documentation

## 2016-04-04 DIAGNOSIS — C50911 Malignant neoplasm of unspecified site of right female breast: Secondary | ICD-10-CM | POA: Diagnosis not present

## 2016-04-04 DIAGNOSIS — I872 Venous insufficiency (chronic) (peripheral): Secondary | ICD-10-CM | POA: Diagnosis not present

## 2016-04-04 NOTE — Progress Notes (Signed)
MRN : [id number]  Rachel Jones is a 82 y.o. ([birth year]) female who presents with chief complaint of  Chief Complaint  Patient presents with  . Re-evaluation    1 year carotid  .  History of Present Illness: The patient is seen for follow up evaluation of carotid stenosis. The carotid stenosis followed by ultrasound.   The patient denies amaurosis fugax. There is no recent history of TIA symptoms or focal motor deficits. There is no prior documented CVA.  The patient is taking enteric-coated aspirin 81 mg daily.  There is no history of migraine headaches. There is no history of seizures.  The patient has a history of coronary artery disease, no recent episodes of angina or shortness of breath. The patient denies PAD or claudication symptoms. There is a history of hyperlipidemia which is being treated with a statin.    Carotid Duplex done today shows <30%.  Left stent widely patent.  No change compared to last study in 04-02-2015  Current Meds  Medication Sig  . aspirin EC 81 MG tablet Take 81 mg by mouth daily.  . calcitRIOL (ROCALTROL) 0.25 MCG capsule Take 0.25 mcg by mouth every other day.  . Cholecalciferol (VITAMIN D3) 1000 units CAPS Take 1 capsule by mouth daily.  . clopidogrel (PLAVIX) 75 MG tablet Take 75 mg by mouth daily.  . Cyanocobalamin (VITAMIN B-12 IJ) Inject 1,000 mcg as directed every 30 (thirty) days.  Marland Kitchen esomeprazole (NEXIUM) 40 MG capsule Take 40 mg by mouth daily.  . ferrous sulfate 325 (65 FE) MG tablet Take 325 mg by mouth daily with breakfast.  . furosemide (LASIX) 20 MG tablet Take 20 mg by mouth daily.  Marland Kitchen losartan (COZAAR) 50 MG tablet Take 50 mg by mouth daily.  Marland Kitchen lovastatin (MEVACOR) 40 MG tablet Take 40 mg by mouth 2 (two) times daily.  . metoprolol tartrate (LOPRESSOR) 25 MG tablet Take 25 mg by mouth 2 (two) times daily.    Past Medical History:  Diagnosis Date  . Anemia   . Arthritis   . Cancer [facility])    uterine cancer  . GERD  (gastroesophageal reflux disease)   . Hypertension   . Kidney disease   . S/P lumpectomy, right breast    having done on 04/26/16    Past Surgical History:  Procedure Laterality Date  . BREAST LUMPECTOMY Right    04/26/16  . CAROTID STENT Left   . PARTIAL HYSTERECTOMY      Social History Social History  Substance Use Topics  . Smoking status: Never Smoker  . Smokeless tobacco: Never Used  . Alcohol use Not on file    Family History No family history on file. No family history of bleeding/clotting disorders, porphyria or autoimmune disease  Allergies  Allergen Reactions  . Codeine Nausea Only  . Sulfa Antibiotics Rash     REVIEW OF SYSTEMS (Negative unless checked)  Constitutional: [] Weight loss  [] Fever  [] Chills Cardiac: [] Chest pain   [] Chest pressure   [] Palpitations   [] Shortness of breath when laying flat   [] Shortness of breath with exertion. Vascular:  [] Pain in legs with walking   [] Pain in legs at rest  [] History of DVT   [] Phlebitis   [] Swelling in legs   [] Varicose veins   [] Non-healing ulcers Pulmonary:   [] Uses home oxygen   [] Productive cough   [] Hemoptysis   [] Wheeze  [] COPD   [] Asthma Neurologic:  [] Dizziness   [] Seizures   [] History of stroke   []   History of TIA  [] Aphasia   [] Vissual changes   [] Weakness or numbness in arm   [] Weakness or numbness in leg Musculoskeletal:   [] Joint swelling   [] Joint pain   [] Low back pain Hematologic:  [] Easy bruising  [] Easy bleeding   [] Hypercoagulable state   [] Anemic Gastrointestinal:  [] Diarrhea   [] Vomiting  [] Gastroesophageal reflux/heartburn   [] Difficulty swallowing. Genitourinary:  [] Chronic kidney disease   [] Difficult urination  [] Frequent urination   [] Blood in urine Skin:  [] Rashes   [] Ulcers  Psychological:  [] History of anxiety   []  History of major depression.  Physical Examination  Vitals:   04/04/16 1430 04/04/16 1431  BP: 121/63 123/68  Pulse: 62   Resp: 17   Weight: 198 lb (89.8 kg)   Height:  5' 2"  (1.575 m)    Body mass index is 36.21 kg/m. Gen: WD/WN, NAD Head: [place]/AT, No temporalis wasting.  Ear/Nose/Throat: Hearing grossly intact, nares w/o erythema or drainage, poor dentition Eyes: PER, EOMI, sclera nonicteric.  Neck: Supple, no masses.  No bruit or JVD.  Pulmonary:  Good air movement, clear to auscultation bilaterally, no use of accessory muscles.  Cardiac: RRR, normal S1, S2, no Murmurs. Vascular:  Bilateral carotid bruit Vessel Right Left  Radial Palpable Palpable  Ulnar Palpable Palpable  Brachial Palpable Palpable  Carotid Palpable Palpable  Femoral Palpable Palpable  Popliteal Palpable Palpable  PT Palpable Palpable  DP Palpable Palpable   Gastrointestinal: soft, non-distended. No guarding/no peritoneal signs.  Musculoskeletal: M/S 5/5 throughout.  No deformity or atrophy.  Neurologic: CN 2-12 intact. Pain and light touch intact in extremities.  Symmetrical.  Speech is fluent. Motor exam as listed above. Psychiatric: Judgment intact, Mood & affect appropriate for pt's clinical situation. Dermatologic: No rashes or ulcers noted.  No changes consistent with cellulitis. Lymph : No Cervical lymphadenopathy, no lichenification or skin changes of chronic lymphedema.  CBC Lab Results  Component Value Date   WBC 6.7 05/28/2014   HGB 8.1 (L) 05/28/2014   HCT 25.3 (L) 05/28/2014   MCV 88 05/28/2014   PLT 194 05/28/2014    BMET    Component Value Date/Time   NA 142 05/28/2014 0548   K 4.5 05/28/2014 0548   CL 106 05/28/2014 0548   CO2 29 05/28/2014 0548   GLUCOSE 99 05/28/2014 0548   BUN 27 (H) 05/28/2014 0548   CREATININE 1.91 (H) 05/28/2014 0548   CALCIUM 7.4 (L) 05/28/2014 0548   GFRNONAA 27 (L) 05/28/2014 0548   GFRAA 33 (L) 05/28/2014 0548   CrCl cannot be calculated (Patient's most recent lab result is older than the maximum 21 days allowed.).  COAG Lab Results  Component Value Date   INR 1.2 05/28/2014    Radiology US Breast Ltd Uni  Right Inc Axilla  Result Date: 03/16/2016 CLINICAL DATA:  82 year old female presenting for final follow-up of probably benign left breast calcifications. EXAM: 2D DIGITAL DIAGNOSTIC BILATERAL MAMMOGRAM WITH CAD AND ADJUNCT TOMO ULTRASOUND RIGHT BREAST COMPARISON:  Previous exam(s). ACR Breast Density Category b: There are scattered areas of fibroglandular density. FINDINGS: The calcifications in the left breast have not significantly changed, and most likely represent fibroadenomatoid calcifications. In the upper-inner quadrant of the right breast, middle depth there is a spiculated irregular mass measuring approximately 1.3 cm. No other suspicious calcifications, masses or areas of distortion are seen in the bilateral breasts. Mammographic images were processed with CAD. On physical exam, no palpable masses are identified in the upper inner right breast. Targeted ultrasound is  performed, showing a small irregular hypoechoic mass at 1:30, 8 cm from the nipple measuring 1.1 x 0.7 x 1.1 cm. Blood flow is identified within the mass on color Doppler imaging. Ultrasound of the right axilla demonstrates multiple normal-appearing lymph nodes. IMPRESSION: 1.  There is a suspicious right breast mass at 1:30. 2.  No evidence of right axillary lymphadenopathy. 3. No significant interval change in the calcifications in the left breast, most likely representing fibroadenomatoid calcifications. RECOMMENDATION: Ultrasound-guided biopsy is recommended for the right breast mass. I have discussed the findings and recommendations with the patient. Results were also provided in writing at the conclusion of the visit. If applicable, a reminder letter will be sent to the patient regarding the next appointment. BI-RADS CATEGORY  5: Highly suggestive of malignancy. Electronically Signed   By: Ammie Ferrier M.D.   On: 03/16/2016 14:50   Mm Diag Breast Tomo Bilateral  Result Date: 03/16/2016 CLINICAL DATA:  82 year old female  presenting for final follow-up of probably benign left breast calcifications. EXAM: 2D DIGITAL DIAGNOSTIC BILATERAL MAMMOGRAM WITH CAD AND ADJUNCT TOMO ULTRASOUND RIGHT BREAST COMPARISON:  Previous exam(s). ACR Breast Density Category b: There are scattered areas of fibroglandular density. FINDINGS: The calcifications in the left breast have not significantly changed, and most likely represent fibroadenomatoid calcifications. In the upper-inner quadrant of the right breast, middle depth there is a spiculated irregular mass measuring approximately 1.3 cm. No other suspicious calcifications, masses or areas of distortion are seen in the bilateral breasts. Mammographic images were processed with CAD. On physical exam, no palpable masses are identified in the upper inner right breast. Targeted ultrasound is performed, showing a small irregular hypoechoic mass at 1:30, 8 cm from the nipple measuring 1.1 x 0.7 x 1.1 cm. Blood flow is identified within the mass on color Doppler imaging. Ultrasound of the right axilla demonstrates multiple normal-appearing lymph nodes. IMPRESSION: 1.  There is a suspicious right breast mass at 1:30. 2.  No evidence of right axillary lymphadenopathy. 3. No significant interval change in the calcifications in the left breast, most likely representing fibroadenomatoid calcifications. RECOMMENDATION: Ultrasound-guided biopsy is recommended for the right breast mass. I have discussed the findings and recommendations with the patient. Results were also provided in writing at the conclusion of the visit. If applicable, a reminder letter will be sent to the patient regarding the next appointment. BI-RADS CATEGORY  5: Highly suggestive of malignancy. Electronically Signed   By: Ammie Ferrier M.D.   On: 03/16/2016 14:50   Mm Clip Placement Right  Addendum Date: 03/29/2016   ADDENDUM REPORT: 03/29/2016 16:18 ADDENDUM: Pathology results of a right breast ultrasound-guided core needle biopsy  show invasive mammary carcinoma, no special type. Pathology results are concordant with imaging findings. I discussed pathology results today by telephone with Ms. Eppard and her daughter. Her daughter reports that Ms. Ebel has done well after the biopsy and has no complaints. Our office has contacted Dr. Doy Hutching' office to relay the pathology results. Their office will arrange for surgical consultation. Electronically Signed   By: Curlene Dolphin M.D.   On: 03/29/2016 16:18   Result Date: 03/29/2016 CLINICAL DATA:  Status post ultrasound-guided right breast biopsy EXAM: DIAGNOSTIC RIGHT MAMMOGRAM POST ULTRASOUND BIOPSY COMPARISON:  Previous exam(s). FINDINGS: Mammographic images were obtained following ultrasound guided biopsy of a suspicious right breast mass at 1:30, 8 cm from the nipple. Post biopsy mammogram demonstrates the [facility] shape 3 coil marker to be within the right breast mass at 1:30. IMPRESSION: Appropriate  marker position as above. Final Assessment: Post Procedure Mammograms for Marker Placement Electronically Signed: By: Pamelia Hoit M.D. On: 03/28/2016 09:20   [place] Rt Breast Bx W Loc Dev 1st Lesion Img Bx Spec US Guide  Result Date: 03/28/2016 CLINICAL DATA:  82 year old female with suspicious right breast mass at 1:30, 8 cm from the nipple EXAM: ULTRASOUND GUIDED RIGHT BREAST CORE NEEDLE BIOPSY COMPARISON:  Previous exam(s). FINDINGS: I met with the patient and we discussed the procedure of ultrasound-guided biopsy, including benefits and alternatives. We discussed the high likelihood of a successful procedure. We discussed the risks of the procedure, including infection, bleeding, tissue injury, clip migration, and inadequate sampling. Informed written consent was given. The usual time-out protocol was performed immediately prior to the procedure. Using sterile technique and 1% Lidocaine as local anesthetic, under direct ultrasound visualization, a 12 gauge spring-loaded device was used to  perform biopsy of a suspicious right breast mass at 1:30, 8 cm from the nipple using a lateral to medial approach. At the conclusion of the procedure a HydroMARK shape 3 tissue marker clip was deployed into the biopsy cavity. Follow up 2 view mammogram was performed and dictated separately. IMPRESSION: Ultrasound guided biopsy of a suspicious right breast mass at 1:30, 8 cm from the nipple. No apparent complications. Electronically Signed   By: Pamelia Hoit M.D.   On: 03/28/2016 09:21    Assessment/Plan 1. Bilateral carotid artery stenosis Recommend:  Given the patient's asymptomatic subcritical stenosis no further invasive testing or surgery at this time.  Continue antiplatelet therapy as prescribed Continue management of CAD, HTN and Hyperlipidemia Healthy heart diet, encouraged exercise at least 4 times per week Follow up in 6 months with duplex ultrasound and physical exam based on <50% stenosis of the Rt carotid artery and the widely patent left stent  - VAS US CAROTID; Future  2. Malignant neoplasm of right breast in female, estrogen receptor negative, unspecified site of breast ([place]) Treatment per oncology  3. Lymphedema No surgery or intervention at this point in time.    I have had a long discussion with the patient regarding venous insufficiency and why it  causes symptoms. I have discussed with the patient the chronic skin changes that accompany venous insufficiency and the long term sequela such as infection and ulceration.  Patient will begin wearing graduated compression stockings class 1 (20-30 mmHg) or compression wraps on a daily basis a prescription was given. The patient will put the stockings on first thing in the morning and removing them in the evening. The patient is instructed specifically not to sleep in the stockings.    In addition, behavioral modification including several periods of elevation of the lower extremities during the day will be continued. I have  demonstrated that proper elevation is a position with the ankles at heart level.  The patient is instructed to begin routine exercise, especially walking on a daily basis  Patient should undergo duplex ultrasound of the venous system to ensure that DVT or reflux is not present.  Following the review of the ultrasound the patient will follow up in 12 months to reassess the degree of swelling and the control that graduated compression stockings or compression wraps  is offering.   The patient can be assessed for a Lymph Pump at that time  4. Chronic venous insufficiency No surgery or intervention at this point in time.    I have had a long discussion with the patient regarding venous insufficiency and why it  causes symptoms. I have discussed with the patient the chronic skin changes that accompany venous insufficiency and the long term sequela such as infection and ulceration.  Patient will begin wearing graduated compression stockings class 1 (20-30 mmHg) or compression wraps on a daily basis a prescription was given. The patient will put the stockings on first thing in the morning and removing them in the evening. The patient is instructed specifically not to sleep in the stockings.    In addition, behavioral modification including several periods of elevation of the lower extremities during the day will be continued. I have demonstrated that proper elevation is a position with the ankles at heart level.  The patient is instructed to begin routine exercise, especially walking on a daily basis  Patient should undergo duplex ultrasound of the venous system to ensure that DVT or reflux is not present.  Following the review of the ultrasound the patient will follow up in 12 months to reassess the degree of swelling and the control that graduated compression stockings or compression wraps  is offering.   The patient can be assessed for a Lymph Pump at that time    Hortencia Pilar,  MD  04/04/2016 5:33 PM

## 2016-04-05 NOTE — Progress Notes (Signed)
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  INTERVAL HISTORY: Patient is an 82 year old female who was noted to have an abnormality on routine screening mammogram. Subsequent ultrasound biopsy revealed the above stated breast cancer. She currently feels well and is asymptomatic. She has no neurologic complaints. She denies any recent fevers or illnesses. She has good appetite and denies weight loss. She has no chest pain or shortness of breath. She denies any nausea, vomiting, constipation, or diarrhea. She has no urinary complaints. Patient feels at her baseline and offers no specific complaints today.  REVIEW OF SYSTEMS:   Review of Systems  Constitutional: Negative.  Negative for fever, malaise/fatigue and weight loss.  Respiratory: Negative.  Negative for cough.   Cardiovascular: Negative.  Negative for chest pain and leg swelling.  Gastrointestinal: Negative.  Negative for abdominal pain.  Genitourinary: Negative.   Musculoskeletal: Negative.   Neurological: Negative for sensory change and weakness.  Psychiatric/Behavioral: Negative.  The patient is not nervous/anxious.     As per HPI. Otherwise, a complete review of systems is negative.  PAST MEDICAL HISTORY: Past Medical History:  Diagnosis Date  . Anemia   . Arthritis   . Cancer [facility])    uterine cancer  . GERD (gastroesophageal reflux disease)   . Hypertension   . Kidney disease   . S/P lumpectomy, right breast    having done on 04/26/16    PAST SURGICAL HISTORY: Past Surgical History:  Procedure Laterality Date  . BREAST LUMPECTOMY Right    04/26/16  . CAROTID STENT Left   . PARTIAL HYSTERECTOMY       FAMILY HISTORY: No family history on file.  ADVANCED DIRECTIVES (Y/N):  N  HEALTH MAINTENANCE: Social History  Substance Use Topics  . Smoking status: Never Smoker  . Smokeless tobacco: Never Used  . Alcohol use Not on file     Colonoscopy:  PAP:  Bone density:  Lipid panel:  Allergies  Allergen Reactions  . Codeine Nausea Only  . Sulfa Antibiotics Rash    Current Outpatient Prescriptions  Medication Sig Dispense Refill  . aspirin EC 81 MG tablet Take 81 mg by mouth daily.    . calcitRIOL (ROCALTROL) 0.25 MCG capsule Take 0.25 mcg by mouth every other day.    . Cholecalciferol (VITAMIN D3) 1000 units CAPS Take 1 capsule by mouth daily.    . clopidogrel (PLAVIX) 75 MG tablet Take 75 mg by mouth daily.    . Cyanocobalamin (VITAMIN B-12 IJ) Inject 1,000 mcg as directed every 30 (thirty) days.    Marland Kitchen esomeprazole (NEXIUM) 40 MG capsule Take 40 mg by mouth daily.    . ferrous sulfate 325 (65 FE) MG tablet Take 325 mg by mouth daily with breakfast.    . furosemide (LASIX) 20 MG tablet Take 20 mg by mouth daily.    Marland Kitchen losartan (COZAAR) 50 MG tablet Take 50 mg by mouth daily.    Marland Kitchen lovastatin (MEVACOR) 40 MG tablet Take 40 mg by mouth 2 (two) times daily.    . metoprolol tartrate (LOPRESSOR) 25 MG tablet Take 25 mg by mouth 2 (two) times daily.    Marland Kitchen omeprazole (PRILOSEC) 20 MG capsule Take 40 mg by mouth daily.  No current facility-administered medications for this visit.     OBJECTIVE: Vitals:   03/31/16 1537  BP: (!) 180/83  Pulse: 67  Resp: 18  Temp: 97.6 F (36.4 C)     There is no height or weight on file to calculate BMI.    ECOG FS:0 - Asymptomatic  General: Well-developed, well-nourished, no acute distress. Eyes: Pink conjunctiva, anicteric sclera. HEENT: Normocephalic, moist mucous membranes, clear oropharnyx. Breasts: Patient requested exam be deferred today. Lungs: Clear to auscultation bilaterally. Heart: Regular rate and rhythm. No rubs, murmurs, or  gallops. Abdomen: Soft, nontender, nondistended. No organomegaly noted, normoactive bowel sounds. Musculoskeletal: No edema, cyanosis, or clubbing. Neuro: Alert, answering all questions appropriately. Cranial nerves grossly intact. Skin: No rashes or petechiae noted. Psych: Normal affect. Lymphatics: No cervical, calvicular, axillary or inguinal LAD.   LAB RESULTS:  Lab Results  Component Value Date   NA 142 05/28/2014   K 4.5 05/28/2014   CL 106 05/28/2014   CO2 29 05/28/2014   GLUCOSE 99 05/28/2014   BUN 27 (H) 05/28/2014   CREATININE 1.91 (H) 05/28/2014   CALCIUM 7.4 (L) 05/28/2014   GFRNONAA 27 (L) 05/28/2014   GFRAA 33 (L) 05/28/2014    Lab Results  Component Value Date   WBC 6.7 05/28/2014   NEUTROABS 5.0 05/28/2014   HGB 8.1 (L) 05/28/2014   HCT 25.3 (L) 05/28/2014   MCV 88 05/28/2014   PLT 194 05/28/2014     STUDIES: US Breast Ltd Uni Right Inc Axilla  Result Date: 03/16/2016 CLINICAL DATA:  82 year old female presenting for final follow-up of probably benign left breast calcifications. EXAM: 2D DIGITAL DIAGNOSTIC BILATERAL MAMMOGRAM WITH CAD AND ADJUNCT TOMO ULTRASOUND RIGHT BREAST COMPARISON:  Previous exam(s). ACR Breast Density Category b: There are scattered areas of fibroglandular density. FINDINGS: The calcifications in the left breast have not significantly changed, and most likely represent fibroadenomatoid calcifications. In the upper-inner quadrant of the right breast, middle depth there is a spiculated irregular mass measuring approximately 1.3 cm. No other suspicious calcifications, masses or areas of distortion are seen in the bilateral breasts. Mammographic images were processed with CAD. On physical exam, no palpable masses are identified in the upper inner right breast. Targeted ultrasound is performed, showing a small irregular hypoechoic mass at 1:30, 8 cm from the nipple measuring 1.1 x 0.7 x 1.1 cm. Blood flow is identified within the mass on color  Doppler imaging. Ultrasound of the right axilla demonstrates multiple normal-appearing lymph nodes. IMPRESSION: 1.  There is a suspicious right breast mass at 1:30. 2.  No evidence of right axillary lymphadenopathy. 3. No significant interval change in the calcifications in the left breast, most likely representing fibroadenomatoid calcifications. RECOMMENDATION: Ultrasound-guided biopsy is recommended for the right breast mass. I have discussed the findings and recommendations with the patient. Results were also provided in writing at the conclusion of the visit. If applicable, a reminder letter will be sent to the patient regarding the next appointment. BI-RADS CATEGORY  5: Highly suggestive of malignancy. Electronically Signed   By: Ammie Ferrier M.D.   On: 03/16/2016 14:50   Mm Diag Breast Tomo Bilateral  Result Date: 03/16/2016 CLINICAL DATA:  82 year old female presenting for final follow-up of probably benign left breast calcifications. EXAM: 2D DIGITAL DIAGNOSTIC BILATERAL MAMMOGRAM WITH CAD AND ADJUNCT TOMO ULTRASOUND RIGHT BREAST COMPARISON:  Previous exam(s). ACR Breast Density Category b: There are scattered areas of fibroglandular density. FINDINGS: The calcifications in the left breast have not significantly changed, and  most likely represent fibroadenomatoid calcifications. In the upper-inner quadrant of the right breast, middle depth there is a spiculated irregular mass measuring approximately 1.3 cm. No other suspicious calcifications, masses or areas of distortion are seen in the bilateral breasts. Mammographic images were processed with CAD. On physical exam, no palpable masses are identified in the upper inner right breast. Targeted ultrasound is performed, showing a small irregular hypoechoic mass at 1:30, 8 cm from the nipple measuring 1.1 x 0.7 x 1.1 cm. Blood flow is identified within the mass on color Doppler imaging. Ultrasound of the right axilla demonstrates multiple  normal-appearing lymph nodes. IMPRESSION: 1.  There is a suspicious right breast mass at 1:30. 2.  No evidence of right axillary lymphadenopathy. 3. No significant interval change in the calcifications in the left breast, most likely representing fibroadenomatoid calcifications. RECOMMENDATION: Ultrasound-guided biopsy is recommended for the right breast mass. I have discussed the findings and recommendations with the patient. Results were also provided in writing at the conclusion of the visit. If applicable, a reminder letter will be sent to the patient regarding the next appointment. BI-RADS CATEGORY  5: Highly suggestive of malignancy. Electronically Signed   By: Ammie Ferrier M.D.   On: 03/16/2016 14:50   Mm Clip Placement Right  Addendum Date: 03/29/2016   ADDENDUM REPORT: 03/29/2016 16:18 ADDENDUM: Pathology results of a right breast ultrasound-guided core needle biopsy show invasive mammary carcinoma, no special type. Pathology results are concordant with imaging findings. I discussed pathology results today by telephone with Ms. Cleere and her daughter. Her daughter reports that Ms. Dlugosz has done well after the biopsy and has no complaints. Our office has contacted Dr. Doy Hutching' office to relay the pathology results. Their office will arrange for surgical consultation. Electronically Signed   By: Curlene Dolphin M.D.   On: 03/29/2016 16:18   Result Date: 03/29/2016 CLINICAL DATA:  Status post ultrasound-guided right breast biopsy EXAM: DIAGNOSTIC RIGHT MAMMOGRAM POST ULTRASOUND BIOPSY COMPARISON:  Previous exam(s). FINDINGS: Mammographic images were obtained following ultrasound guided biopsy of a suspicious right breast mass at 1:30, 8 cm from the nipple. Post biopsy mammogram demonstrates the [facility] shape 3 coil marker to be within the right breast mass at 1:30. IMPRESSION: Appropriate marker position as above. Final Assessment: Post Procedure Mammograms for Marker Placement Electronically  Signed: By: Pamelia Hoit M.D. On: 03/28/2016 09:20   [place] Rt Breast Bx W Loc Dev 1st Lesion Img Bx Spec US Guide  Result Date: 03/28/2016 CLINICAL DATA:  82 year old female with suspicious right breast mass at 1:30, 8 cm from the nipple EXAM: ULTRASOUND GUIDED RIGHT BREAST CORE NEEDLE BIOPSY COMPARISON:  Previous exam(s). FINDINGS: I met with the patient and we discussed the procedure of ultrasound-guided biopsy, including benefits and alternatives. We discussed the high likelihood of a successful procedure. We discussed the risks of the procedure, including infection, bleeding, tissue injury, clip migration, and inadequate sampling. Informed written consent was given. The usual time-out protocol was performed immediately prior to the procedure. Using sterile technique and 1% Lidocaine as local anesthetic, under direct ultrasound visualization, a 12 gauge spring-loaded device was used to perform biopsy of a suspicious right breast mass at 1:30, 8 cm from the nipple using a lateral to medial approach. At the conclusion of the procedure a HydroMARK shape 3 tissue marker clip was deployed into the biopsy cavity. Follow up 2 view mammogram was performed and dictated separately. IMPRESSION: Ultrasound guided biopsy of a suspicious right breast mass at 1:30, 8 cm  from the nipple. No apparent complications. Electronically Signed   By: Pamelia Hoit M.D.   On: 03/28/2016 09:21    ASSESSMENT: Clinical stage IA ER positive, PR HER-2 negative invasive carcinoma of the upper inner quadrant of right breast.  PLAN:    1. Clinical stage IA ER positive, PR HER-2 negative invasive carcinoma of the upper inner quadrant of right breast: Given the size of patient's tumor and stage of disease, have recommended patient pursue initial treatment with lumpectomy and has surgery scheduled on April 26, 2016. Will order Mammoprint testing to assess whether chemotherapy is necessary. Patient will likely require adjuvant XRT if she has  lumpectomy. She will also benefit from an aromatase inhibitor for total 5 years given the ER status of her tumor. Return to clinic one to 2 weeks after her surgery to discuss her final pathology results and additional treatment planning.  Approximate 45 minutes was spent in discussion of which greater than 50% was consultation.  Patient expressed understanding and was in agreement with this plan. She also understands that She can call clinic at any time with any questions, concerns, or complaints.   Primary cancer of upper inner quadrant of right female breast [facility])   Staging form: Breast, AJCC 7th Edition   - Clinical stage from 04/05/2016: Stage IA (T1c, N0, M0) - Signed by Lloyd Huger, MD on 04/05/2016  Lloyd Huger, MD   04/05/2016 1:10 PM

## 2016-04-11 ENCOUNTER — Other Ambulatory Visit (INDEPENDENT_AMBULATORY_CARE_PROVIDER_SITE_OTHER): Payer: Self-pay | Admitting: Vascular Surgery

## 2016-04-11 DIAGNOSIS — G4733 Obstructive sleep apnea (adult) (pediatric): Secondary | ICD-10-CM | POA: Diagnosis not present

## 2016-04-13 ENCOUNTER — Telehealth (INDEPENDENT_AMBULATORY_CARE_PROVIDER_SITE_OTHER): Payer: Self-pay

## 2016-04-13 NOTE — Telephone Encounter (Signed)
Sherry from Dr. Tamala Julian office at [facility] called wanting to know if from a vascular standpoint the patient would be cleared to have surgery. I am working with her to facilitate a response from Dr. Delana Meyer.

## 2016-04-18 ENCOUNTER — Encounter
Admission: RE | Admit: 2016-04-18 | Discharge: 2016-04-18 | Disposition: A | Discharge status: Home / Self Care | Payer: Commercial Managed Care - HMO | Source: Ambulatory Visit | Attending: Surgery | Admitting: Surgery

## 2016-04-18 DIAGNOSIS — C50211 Malignant neoplasm of upper-inner quadrant of right female breast: Secondary | ICD-10-CM | POA: Diagnosis not present

## 2016-04-18 DIAGNOSIS — E538 Deficiency of other specified B group vitamins: Secondary | ICD-10-CM | POA: Diagnosis not present

## 2016-04-18 DIAGNOSIS — Z01818 Encounter for other preprocedural examination: Secondary | ICD-10-CM | POA: Insufficient documentation

## 2016-04-18 HISTORY — DX: Localized edema: R60.0

## 2016-04-18 HISTORY — DX: Dyspnea, unspecified: R06.00

## 2016-04-18 LAB — BASIC METABOLIC PANEL
ANION GAP: 8 (ref 5–15)
BUN: 36 mg/dL — AB (ref 6–20)
CHLORIDE: 108 mmol/L (ref 101–111)
CO2: 25 mmol/L (ref 22–32)
Calcium: 8.7 mg/dL — ABNORMAL LOW (ref 8.9–10.3)
Creatinine, Ser: 1.83 mg/dL — ABNORMAL HIGH (ref 0.44–1.00)
GFR calc Af Amer: 29 mL/min — ABNORMAL LOW (ref >60)
GFR calc non Af Amer: 25 mL/min — ABNORMAL LOW (ref >60)
GLUCOSE: 100 mg/dL — AB (ref 65–99)
POTASSIUM: 4.9 mmol/L (ref 3.5–5.1)
Sodium: 141 mmol/L (ref 135–145)

## 2016-04-18 LAB — CBC
HEMATOCRIT: 35.4 % (ref 35.0–47.0)
HEMOGLOBIN: 11.9 g/dL — AB (ref 12.0–16.0)
MCH: 30.9 pg (ref 26.0–34.0)
MCHC: 33.7 g/dL (ref 32.0–36.0)
MCV: 91.5 fL (ref 80.0–100.0)
Platelets: 186 10*3/uL (ref 150–440)
RBC: 3.87 MIL/uL (ref 3.80–5.20)
RDW: 13.6 % (ref 11.5–14.5)
WBC: 6.3 10*3/uL (ref 3.6–11.0)

## 2016-04-18 NOTE — Pre-Procedure Instructions (Signed)
occasional  Date of Service: 03/14/2016 Date of Birth: July 31, 1933 PCP: Idelle Crouch, MD, MD  History of Present Illness: Rachel Jones is a 82 y.o.female patient who returns for follow-up visit. She recent was diagnosed with sleep apnea and was placed on a CPAP machine when she is using every night. She is compliant with this and feels a lot better since it was started. She is able to sleep better. She has more energy during the day. Due to her symptoms she underwent a functional study and echocardiogram. The functional study revealed normal LV function with no ischemia. The echo showed an ejection fraction of 55% with trivial MR and mild TR with mild pulmonary hypertension which is consistent with sleep apnea. Past Medical and Surgical History  Past Medical History Past Medical History:  Diagnosis Date  . Carotid artery stenosis  . Cervical cancer (CMS-HCC)  s/p hysterectomy  . Gout  . Hydronephrosis, unspecified  with renal insufficiency   Past Surgical History She has a past surgical history that includes Hysterectomy.   Medications and Allergies  Current Medications  Current Outpatient Prescriptions  Medication Sig Dispense Refill  . aspirin 81 MG EC tablet Take 81 mg by mouth once daily.  . cholecalciferol (VITAMIN D3) 1,000 unit capsule Take 1,000 Units by mouth once daily.  . clopidogrel (PLAVIX) 75 mg tablet Take 75 mg by mouth once daily.  . cyanocobalamin (VITAMIN B12) 1,000 mcg/mL injection Inject 1,000 mcg into the muscle monthly.  . esomeprazole (NEXIUM) 40 MG DR capsule Take 40 mg by mouth once daily.  . ferrous sulfate 325 (65 FE) MG tablet Take 325 mg by mouth daily with breakfast.  . FUROsemide (LASIX) 20 MG tablet TAKE 1 TABLET EVERY DAY 90 tablet 3  . losartan (COZAAR) 50 MG tablet TAKE 1 TABLET ONE TIME DAILY 90 tablet 3  . metoprolol tartrate (LOPRESSOR) 25 MG tablet TAKE 1 TABLET TWICE DAILY 180 tablet 3  . calcitRIOL (ROCALTROL) 0.25 MCG capsule Take 0.25 mcg  by mouth every other day. No Saturday or Sunday  . lovastatin (MEVACOR) 40 MG tablet TAKE 1 TABLET TWICE DAILY 180 tablet 3   Current Facility-Administered Medications  Medication Dose Route Frequency Provider Last Rate Last Dose  . cyanocobalamin (VITAMIN B12) injection 1,000 mcg 1,000 mcg Intramuscular Q30 Days Idelle Crouch, MD 1,000 mcg at 02/15/16 1553   Allergies: Codeine and Sulfa (sulfonamide antibiotics)  Social and Family History  Social History reports that she has never smoked. She has never used smokeless tobacco. She reports that she does not drink alcohol or use drugs.  Family History Family History  Problem Relation Age of Onset  . Heart disease Mother  . Hypertension Mother  . Heart disease Maternal Aunt  . Diabetes type II Maternal Aunt  . Diabetes type II Maternal Grandmother  . Heart disease Maternal Grandmother  . Diabetes type II Maternal Grandfather  . Breast cancer Paternal Grandmother  . Diabetes type II Paternal Grandmother   Review of Systems  Review of Systems  Constitutional: Negative for chills, diaphoresis, fever, malaise/fatigue and weight loss.  HENT: Negative for congestion, ear discharge, hearing loss and tinnitus.  Eyes: Negative for blurred vision.  Respiratory: Positive for shortness of breath. Negative for cough, hemoptysis, sputum production and wheezing.  Cardiovascular: Positive for chest pain. Negative for palpitations, orthopnea, claudication, leg swelling and PND.  Gastrointestinal: Negative for abdominal pain, blood in stool, constipation, diarrhea, heartburn, melena, nausea and vomiting.  Genitourinary: Negative for dysuria, frequency, hematuria and  urgency.  Musculoskeletal: Positive for neck pain. Negative for back pain, falls, joint pain and myalgias.  Skin: Negative for itching and rash.  Neurological: Negative for dizziness, tingling, focal weakness, loss of consciousness, weakness and headaches.  Endo/Heme/Allergies:  Negative for polydipsia. Does not bruise/bleed easily.  Psychiatric/Behavioral: Negative for depression, memory loss and substance abuse. The patient is not nervous/anxious.    Physical Examination   Vitals: BP (P) 124/70  Pulse (P) 71  Ht (P) 160 cm (5\' 3" )  Wt (P) 88.7 kg (195 lb 9.6 oz)  SpO2 (P) 96%  BMI (P) 34.65 kg/m  Ht:(P) 160 cm (5\' 3" ) Wt:(P) 88.7 kg (195 lb 9.6 oz) KDT:OIZT surface area is 1.99 meters squared (pended). Body mass index is 34.65 kg/m (pended).  Wt Readings from Last 3 Encounters:  03/14/16 (P) 88.7 kg (195 lb 9.6 oz)  02/15/16 (P) 88.9 kg (196 lb)  02/15/16 88.4 kg (194 lb 12.8 oz)   BP Readings from Last 3 Encounters:  03/14/16 (P) 124/70  02/15/16 (!) (P) 122/90  02/15/16 132/80   LUNGS Breath Sounds: Normal Percussion: Normal  CARDIOVASCULAR JVP CV wave: no HJR: no Elevation at 90 degrees: None Carotid Pulse: normal pulsation bilaterally Bruit: Bilateral soft carotid bruits Apex: apical impulse normal  Auscultation Rhythm: normal sinus rhythm S1: normal S2: normal Clicks: no Rub: no Murmurs: no murmurs  Gallop: None ABDOMEN Liver enlargement: no Pulsatile aorta: no Ascites: no Bruits: no  EXTREMITIES Clubbing: no Edema: trace to 1+ bilateral pedal edema Pulses: peripheral pulses symmetrical Femoral Bruits: no Amputation: no SKIN Rash: no Cyanosis: no Embolic phemonenon: no Bruising: no NEURO Alert and Oriented to person, place and time: yes Non focal: yes LABS Last 3 CBC results: Lab Results  Component Value Date  WBC 6.4 02/15/2016  WBC 6.5 11/12/2015  WBC 7.8 08/06/2015   Lab Results  Component Value Date  HGB 10.7 (L) 02/15/2016  HGB 10.9 (L) 11/12/2015  HGB 11.1 (L) 08/06/2015   Lab Results  Component Value Date  HCT 33.5 (L) 02/15/2016  HCT 34.1 (L) 11/12/2015  HCT 34.9 (L) 08/06/2015   Lab Results  Component Value Date  PLT 193 02/15/2016  PLT 193 11/12/2015  PLT 244 08/06/2015   Lab  Results  Component Value Date  CREATININE 1.8 (H) 02/15/2016  BUN 35 (H) 02/15/2016  NA 143 02/15/2016  K 5.1 02/15/2016  CL 110 (H) 02/15/2016  CO2 26.8 02/15/2016   Lab Results  Component Value Date  HDL 34.0 (L) 02/15/2016  HDL 28.4 (L) 08/06/2015  HDL 31.6 (L) 02/02/2015   Lab Results  Component Value Date  LDLCALC 61 02/15/2016  LDLCALC 66 08/06/2015  LDLCALC 66 02/02/2015   Lab Results  Component Value Date  TRIG 253 (H) 02/15/2016  TRIG 212 (H) 08/06/2015  TRIG 280 (H) 02/02/2015   Lab Results  Component Value Date  ALT 7 02/15/2016  AST 13 02/15/2016  ALKPHOS 50 02/15/2016   Lab Results  Component Value Date  TSH 3.489 12/05/2014     Assessment and Plan   81 y.o. female with  ICD-10-CM ICD-9-CM  1. Essential hypertension-currently stable. She is tolerating her current regimen well. Will continue with this as well as dash diet. I10 401.9  2. Mixed hyperlipidemia-continue with lovastatin at 40 mg daily. LDL goal less than 100 E78.2 272.2  3. Left-sided carotid artery disease-is doing well post percutaneous intervention. Remains on dual anti-platelet therapy with aspirin and clopidogrel. Continue to follow-up with vascular surgery regarding progression. I77.9 447.9  4. Atypical chest pain-no further chest pain. Cardiac catheterization revealed no significant coronary disease. Will continue with aspirin and clopidogrel as well as lovastatin for hyperlipidemia. Symptoms have progressed. Echo and functional study revealed no further ischemia or structural disease other than mild pulmonary hypertension. Compliance with CPAP is recommended. No further invasive or noninvasive workup indicated at present. Should symptoms change she will contact me. R07.89 786.59   5. Sleep apnea-continue with CPAP. Patient is using this and has a lot of benefit from it.  Return in about 6 months (around 09/12/2016).  These notes generated with voice recognition software. I apologize  for typographical errors.  Sydnee Levans, MD

## 2016-04-18 NOTE — Pre-Procedure Instructions (Signed)
Cardiac clearance on chart. 

## 2016-04-18 NOTE — Pre-Procedure Instructions (Addendum)
[place] Component Name Value Ref Range  Vent Rate (bpm) 59   PR Interval (msec) 168   QRS Interval (msec) 66   QT Interval (msec) 404   QTc (msec) 399   Result Narrative  Sinus bradycardia Otherwise normal ECG When compared with ECG of 13-Aug-2015 15:03, No significant change was found I reviewed and concur with this report. Electronically signed HU:DJSH MD, KEN ([id number]) on 02/22/2016 7:50:32 AM  Status Results Details   Component Name Value Ref Range  LV Ejection Fraction (%) 50   Aortic Valve Stenosis Grade none   Aortic Valve Regurgitation Grade none   Aortic Valve Max Velocity (m/s) 1.5 m/sec   Mitral Valve Stenosis Grade none   Mitral Valve Regurgitation Grade trivial   Tricuspid Valve Regurgitation Grade mild   Right Ventricle Systolic Pressure (mmHg) 70.2 mmHg   Tricuspid Valve Regurgitation Max Velocity (m/s) 2.7 m/sec   LV End Diastolic Diameter (cm) 4.3 cm  LV End Systolic Diameter (cm) 3.0 cm  LV Septum Wall Thickness (cm) 1.3 cm  LV Posterior Wall Thickness (cm) 1.0 cm  Left Atrium Diameter (cm) 4.0 cm  Result Narrative   CARDIOLOGY DEPARTMENT GRAINNE, KNIGHTS [id number] [facility] Acct #: [id number] [address] [place], [place] [address] Date: 03/04/2016 09:06 AM  Adult Female Age: 82 yrs  ECHOCARDIOGRAM REPORT Outpatient  KC::KCWC STUDY:CHEST WALL TAPE: MD1:FATH, KENNETH ALAN  ECHO:Yes DOPPLER:YesFILE: BP: 132/80 mmHg COLOR:YesCONTRAST:NoMACHINE:PhilipsHeight: 63 in RV BIOPSY:No 3D:No SOUND QLTY:Moderate Weight: 196  lb  MEDIUM:None BSA: 1.9 m2  ___________________________________________________________________________________________  HISTORY:DOE REASON:Assess, LV function INDICATION:SOB (shortness of breath) [R06.02 (ICD-10-CM)] ___________________________________________________________________________________________

## 2016-04-18 NOTE — Patient Instructions (Signed)
  Your procedure is scheduled on: 04/26/16 Tues Report to Same Day Surgery 2nd floor medical mall [facility] Entrance-take elevator on left to 2nd floor.  Check in with surgery information desk.) To find out your arrival time please call [phone] between 1PM - 3PM on 04/25/16  Remember: Instructions that are not followed completely may result in serious medical risk, up to and including death, or upon the discretion of your surgeon and anesthesiologist your surgery may need to be rescheduled.    _x___ 1. Do not eat food or drink liquids after midnight. No gum chewing or hard candies.     __x__ 2. No Alcohol for 24 hours before or after surgery.   __x__3. No Smoking for 24 prior to surgery.   ____  4. Bring all medications with you on the day of surgery if instructed.    __x__ 5. Notify your doctor if there is any change in your medical condition     (cold, fever, infections).     Do not wear jewelry, make-up, hairpins, clips or nail polish.  Do not wear lotions, powders, or perfumes. You may wear deodorant.  Do not shave 48 hours prior to surgery. Men may shave face and neck.  Do not bring valuables to the hospital.    [facility] is not responsible for any belongings or valuables.               Contacts, dentures or bridgework may not be worn into surgery.  Leave your suitcase in the car. After surgery it may be brought to your room.  For patients admitted to the hospital, discharge time is determined by your treatment team.   Patients discharged the day of surgery will not be allowed to drive home.  You will need someone to drive you home and stay with you the night of your procedure.    Please read over the following fact sheets that you were given:   [facility] Preparing for Surgery and or MRSA Information   _x___ Take these medicines the morning of surgery with A SIP OF WATER:    1. esomeprazole (NEXIUM) 40 MG capsule  2.losartan (COZAAR) 50 MG tablet  3.lovastatin  (MEVACOR) 40 MG tablet  4.metoprolol tartrate (LOPRESSOR) 25 MG tablet  5.  6.  ____Fleets enema or Magnesium Citrate as directed.   _x___ Use CHG Soap or sage wipes as directed on instruction sheet   ____ Use inhalers on the day of surgery and bring to hospital day of surgery  ____ Stop metformin 2 days prior to surgery    ____ Take 1/2 of usual insulin dose the night before surgery and none on the morning of           surgery.   _x___ Stop Aspirin, Coumadin, Pllavix ,Eliquis, Effient, or Pradaxa Stop Aspirin and Plavix 7 days before surgery per Dr Thompson Caul instructions  x__ Stop Anti-inflammatories such as Advil, Aleve, Ibuprofen, Motrin, Naproxen,          Naprosyn, Goodies powders or aspirin products. Ok to take Tylenol.   ____ Stop supplements until after surgery.    ____ Bring C-Pap to the hospital.

## 2016-04-19 DIAGNOSIS — I129 Hypertensive chronic kidney disease with stage 1 through stage 4 chronic kidney disease, or unspecified chronic kidney disease: Secondary | ICD-10-CM | POA: Diagnosis not present

## 2016-04-19 DIAGNOSIS — N184 Chronic kidney disease, stage 4 (severe): Secondary | ICD-10-CM | POA: Diagnosis not present

## 2016-04-19 DIAGNOSIS — D631 Anemia in chronic kidney disease: Secondary | ICD-10-CM | POA: Diagnosis not present

## 2016-04-19 DIAGNOSIS — N2581 Secondary hyperparathyroidism of renal origin: Secondary | ICD-10-CM | POA: Diagnosis not present

## 2016-04-19 DIAGNOSIS — R809 Proteinuria, unspecified: Secondary | ICD-10-CM | POA: Diagnosis not present

## 2016-04-22 DIAGNOSIS — C50919 Malignant neoplasm of unspecified site of unspecified female breast: Secondary | ICD-10-CM

## 2016-04-22 HISTORY — DX: Malignant neoplasm of unspecified site of unspecified female breast: C50.919

## 2016-04-26 ENCOUNTER — Ambulatory Visit: Payer: Commercial Managed Care - HMO | Admitting: Anesthesiology

## 2016-04-26 ENCOUNTER — Ambulatory Visit
Admission: RE | Admit: 2016-04-26 | Discharge: 2016-04-26 | Disposition: A | Discharge status: Home / Self Care | Payer: Commercial Managed Care - HMO | Source: Ambulatory Visit | Attending: Surgery | Admitting: Surgery

## 2016-04-26 ENCOUNTER — Encounter
Admission: RE | Disposition: A | Discharge status: Home / Self Care | Payer: Self-pay | Source: Ambulatory Visit | Attending: Surgery

## 2016-04-26 ENCOUNTER — Encounter
Admission: RE | Admit: 2016-04-26 | Discharge: 2016-04-26 | Disposition: A | Discharge status: Home / Self Care | Payer: Commercial Managed Care - HMO | Source: Ambulatory Visit | Attending: Surgery | Admitting: Surgery

## 2016-04-26 ENCOUNTER — Encounter: Payer: Self-pay | Admitting: *Deleted

## 2016-04-26 DIAGNOSIS — I10 Essential (primary) hypertension: Secondary | ICD-10-CM | POA: Diagnosis not present

## 2016-04-26 DIAGNOSIS — C50211 Malignant neoplasm of upper-inner quadrant of right female breast: Secondary | ICD-10-CM

## 2016-04-26 DIAGNOSIS — C50911 Malignant neoplasm of unspecified site of right female breast: Secondary | ICD-10-CM | POA: Insufficient documentation

## 2016-04-26 DIAGNOSIS — K219 Gastro-esophageal reflux disease without esophagitis: Secondary | ICD-10-CM | POA: Diagnosis not present

## 2016-04-26 DIAGNOSIS — R928 Other abnormal and inconclusive findings on diagnostic imaging of breast: Secondary | ICD-10-CM | POA: Diagnosis not present

## 2016-04-26 DIAGNOSIS — I2510 Atherosclerotic heart disease of native coronary artery without angina pectoris: Secondary | ICD-10-CM | POA: Diagnosis not present

## 2016-04-26 DIAGNOSIS — G4730 Sleep apnea, unspecified: Secondary | ICD-10-CM | POA: Insufficient documentation

## 2016-04-26 DIAGNOSIS — D649 Anemia, unspecified: Secondary | ICD-10-CM | POA: Diagnosis not present

## 2016-04-26 DIAGNOSIS — C50411 Malignant neoplasm of upper-outer quadrant of right female breast: Secondary | ICD-10-CM

## 2016-04-26 HISTORY — DX: Atherosclerotic heart disease of native coronary artery without angina pectoris: I25.10

## 2016-04-26 HISTORY — PX: PARTIAL MASTECTOMY WITH NEEDLE LOCALIZATION: SHX6008

## 2016-04-26 HISTORY — PX: SENTINEL NODE BIOPSY: SHX6608

## 2016-04-26 HISTORY — DX: Sleep apnea, unspecified: G47.30

## 2016-04-26 SURGERY — PARTIAL MASTECTOMY WITH NEEDLE LOCALIZATION
Anesthesia: General | Laterality: Right | Wound class: Clean

## 2016-04-26 MED ORDER — LACTATED RINGERS IV SOLN: INTRAVENOUS | Status: DC

## 2016-04-26 MED ORDER — FENTANYL CITRATE (PF) 100 MCG/2ML IJ SOLN
25.0000 ug | INTRAMUSCULAR | Status: DC | PRN
  Administered 2016-04-26 (×4): 25 ug via INTRAVENOUS

## 2016-04-26 MED ORDER — HYDROCODONE-ACETAMINOPHEN 5-325 MG PO TABS
1.0000 | ORAL_TABLET | ORAL | Status: DC | PRN
  Administered 2016-04-26: 1 via ORAL

## 2016-04-26 MED ORDER — TECHNETIUM TC 99M SULFUR COLLOID
1.0080 | Freq: Once | INTRAVENOUS | Status: AC | PRN
  Administered 2016-04-26: 1.008 via INTRAVENOUS

## 2016-04-26 MED ORDER — BUPIVACAINE-EPINEPHRINE 0.5% -1:200000 IJ SOLN
INTRAMUSCULAR | Status: DC | PRN
  Administered 2016-04-26: 30 mL

## 2016-04-26 MED ORDER — FENTANYL CITRATE (PF) 100 MCG/2ML IJ SOLN
INTRAMUSCULAR | Status: DC
Start: 2016-04-26 — End: 2016-04-26
  Filled 2016-04-26: qty 2

## 2016-04-26 MED ORDER — SODIUM CHLORIDE 0.9 % IV SOLN
INTRAVENOUS | Status: DC
  Administered 2016-04-26: 100 mL/h via INTRAVENOUS
  Administered 2016-04-26: 13:00:00 via INTRAVENOUS

## 2016-04-26 MED ORDER — GLYCOPYRROLATE 0.2 MG/ML IJ SOLN
INTRAMUSCULAR | Status: DC | PRN
  Administered 2016-04-26: 0.2 mg via INTRAVENOUS

## 2016-04-26 MED ORDER — FENTANYL CITRATE (PF) 100 MCG/2ML IJ SOLN
INTRAMUSCULAR | Status: DC | PRN
  Administered 2016-04-26 (×2): 25 ug via INTRAVENOUS

## 2016-04-26 MED ORDER — EPINEPHRINE PF 1 MG/ML IJ SOLN
INTRAMUSCULAR | Status: AC
  Filled 2016-04-26: qty 1

## 2016-04-26 MED ORDER — PHENYLEPHRINE HCL 10 MG/ML IJ SOLN
INTRAMUSCULAR | Status: DC | PRN
  Administered 2016-04-26 (×3): 100 ug via INTRAVENOUS

## 2016-04-26 MED ORDER — SODIUM CHLORIDE 0.9 % IV SOLN
INTRAVENOUS | Status: DC | PRN
  Administered 2016-04-26: 50 ug/min via INTRAVENOUS

## 2016-04-26 MED ORDER — HYDROCODONE-ACETAMINOPHEN 5-325 MG PO TABS: 1.0000 | ORAL_TABLET | ORAL | 0 refills | Status: DC | PRN

## 2016-04-26 MED ORDER — ONDANSETRON HCL 4 MG/2ML IJ SOLN
INTRAMUSCULAR | Status: DC | PRN
  Administered 2016-04-26: 4 mg via INTRAVENOUS

## 2016-04-26 MED ORDER — ONDANSETRON HCL 4 MG/2ML IJ SOLN: 4.0000 mg | Freq: Once | INTRAMUSCULAR | 0 refills | Status: DC | PRN

## 2016-04-26 MED ORDER — ONDANSETRON HCL 4 MG/2ML IJ SOLN: 4.0000 mg | Freq: Once | INTRAMUSCULAR | Status: DC | PRN

## 2016-04-26 MED ORDER — HYDROCODONE-ACETAMINOPHEN 5-325 MG PO TABS
ORAL_TABLET | ORAL | Status: AC
  Filled 2016-04-26: qty 1

## 2016-04-26 MED ORDER — BUPIVACAINE HCL (PF) 0.5 % IJ SOLN
INTRAMUSCULAR | Status: AC
  Filled 2016-04-26: qty 30

## 2016-04-26 MED ORDER — PROPOFOL 10 MG/ML IV BOLUS
INTRAVENOUS | Status: DC | PRN
  Administered 2016-04-26: 150 mg via INTRAVENOUS

## 2016-04-26 MED ORDER — LIDOCAINE HCL (CARDIAC) 20 MG/ML IV SOLN
INTRAVENOUS | Status: DC | PRN
  Administered 2016-04-26: 100 mg via INTRAVENOUS

## 2016-04-26 MED ORDER — DEXAMETHASONE SODIUM PHOSPHATE 10 MG/ML IJ SOLN
INTRAMUSCULAR | Status: DC | PRN
  Administered 2016-04-26: 4 mg via INTRAVENOUS

## 2016-04-26 MED ORDER — EPHEDRINE SULFATE 50 MG/ML IJ SOLN
INTRAMUSCULAR | Status: DC | PRN
  Administered 2016-04-26: 10 mg via INTRAVENOUS
  Administered 2016-04-26: 5 mg via INTRAVENOUS
  Administered 2016-04-26: 10 mg via INTRAVENOUS

## 2016-04-26 SURGICAL SUPPLY — 33 items
BLADE SURG 15 STRL LF DISP TIS (BLADE) ×1 IMPLANT
BLADE SURG 15 STRL SS (BLADE) ×2
CANISTER SUCT 1200ML W/VALVE (MISCELLANEOUS) ×3 IMPLANT
CHLORAPREP W/TINT 26ML (MISCELLANEOUS) ×3 IMPLANT
CNTNR SPEC 2.5X3XGRAD LEK (MISCELLANEOUS) ×2
CONT SPEC 4OZ STER OR WHT (MISCELLANEOUS) ×4
CONTAINER SPEC 2.5X3XGRAD LEK (MISCELLANEOUS) ×2 IMPLANT
DERMABOND ADVANCED (GAUZE/BANDAGES/DRESSINGS) ×2
DERMABOND ADVANCED .7 DNX12 (GAUZE/BANDAGES/DRESSINGS) ×1 IMPLANT
DEVICE DUBIN SPECIMEN MAMMOGRA (MISCELLANEOUS) ×3 IMPLANT
DRAPE LAPAROTOMY 77X122 PED (DRAPES) ×3 IMPLANT
ELECT REM PT RETURN 9FT ADLT (ELECTROSURGICAL) ×3
ELECTRODE REM PT RTRN 9FT ADLT (ELECTROSURGICAL) ×1 IMPLANT
GLOVE BIO SURGEON STRL SZ7.5 (GLOVE) ×6 IMPLANT
GOWN STRL REUS W/ TWL LRG LVL3 (GOWN DISPOSABLE) ×2 IMPLANT
GOWN STRL REUS W/TWL LRG LVL3 (GOWN DISPOSABLE) ×4
KIT RM TURNOVER STRD PROC AR (KITS) ×3 IMPLANT
LABEL OR SOLS (LABEL) ×3 IMPLANT
MARGIN MAP 10MM (MISCELLANEOUS) ×3 IMPLANT
NDL SAFETY 18GX1.5 (NEEDLE) ×3 IMPLANT
NDL SAFETY 22GX1.5 (NEEDLE) ×3 IMPLANT
NEEDLE HYPO 25X1 1.5 SAFETY (NEEDLE) ×3 IMPLANT
PACK BASIN MINOR ARMC (MISCELLANEOUS) ×3 IMPLANT
SLEVE PROBE SENORX GAMMA FIND (MISCELLANEOUS) ×3 IMPLANT
SUT CHROMIC 3 0 SH 27 (SUTURE) ×3 IMPLANT
SUT CHROMIC 4 0 RB 1X27 (SUTURE) ×3 IMPLANT
SUT ETHILON 3-0 FS-10 30 BLK (SUTURE) ×3
SUT MNCRL 4-0 (SUTURE) ×2
SUT MNCRL 4-0 27XMFL (SUTURE) ×1
SUTURE EHLN 3-0 FS-10 30 BLK (SUTURE) ×1 IMPLANT
SUTURE MNCRL 4-0 27XMF (SUTURE) ×1 IMPLANT
SYRINGE 10CC LL (SYRINGE) ×3 IMPLANT
WATER STERILE IRR 1000ML POUR (IV SOLUTION) ×3 IMPLANT

## 2016-04-26 NOTE — Anesthesia Postprocedure Evaluation (Signed)
Anesthesia Post Note  Patient: Rachel Jones  Procedure(s) Performed: Procedure(s) (LRB): PARTIAL MASTECTOMY WITH NEEDLE LOCALIZATION (Right) SENTINEL NODE BIOPSY (Right)  Patient location during evaluation: PACU Anesthesia Type: General Level of consciousness: awake and alert Pain management: pain level controlled Vital Signs Assessment: post-procedure vital signs reviewed and stable Respiratory status: spontaneous breathing and respiratory function stable Cardiovascular status: stable Anesthetic complications: no    Last Vitals:  Vitals:   04/26/16 1535 04/26/16 1540  BP:  (!) 149/73  Pulse: 68 70  Resp: 15 14  Temp:      Last Pain:  Vitals:   04/26/16 1540  TempSrc:   PainSc: 6                  Tavarious Freel K

## 2016-04-26 NOTE — H&P (Signed)
  She had recent mammogram demonstrating a spiculated 1.3 cm mass in the upper inner quadrant of the right breast. Right breast ultrasound demonstrated an irregular hypoechoic mass at the 1:30 position 8 cm from the nipple measuring 1.1 x 0.7 x 1.1 cm. Pathology demonstrated invasive mammary carcinomawith tight measuring 10 mm. Right partial mastectomy with axillary sentinel lymph node biopsy was recommended for definitive treatment. She did have preoperative insertion of Kopan's wire. I reviewed mammogram images with the wire in place. She also had injection of radioactive technetium sulfur colloid.  She reports no change in overall condition since the day of the office exam.  The right side was marked YES.  I discussed the plan for surgery

## 2016-04-26 NOTE — Anesthesia Preprocedure Evaluation (Signed)
Anesthesia Evaluation  Patient identified by MRN, date of birth, ID band Patient awake    Reviewed: Allergy & Precautions, NPO status , Patient's Chart, lab work & pertinent test results, reviewed documented beta blocker date and time   History of Anesthesia Complications Negative for: history of anesthetic complications  Airway Mallampati: II       Dental  (+) Upper Dentures, Lower Dentures   Pulmonary sleep apnea and Continuous Positive Airway Pressure Ventilation ,           Cardiovascular hypertension, Pt. on medications and Pt. on home beta blockers + CAD       Neuro/Psych negative neurological ROS     GI/Hepatic Neg liver ROS, GERD  Medicated and Controlled,  Endo/Other  negative endocrine ROS  Renal/GU Renal InsufficiencyRenal disease     Musculoskeletal   Abdominal   Peds  Hematology  (+) anemia ,   Anesthesia Other Findings   Reproductive/Obstetrics                             Anesthesia Physical Anesthesia Plan  ASA: III  Anesthesia Plan: General   Post-op Pain Management:    Induction: Intravenous  Airway Management Planned: LMA  Additional Equipment:   Intra-op Plan:   Post-operative Plan:   Informed Consent: I have reviewed the patients History and Physical, chart, labs and discussed the procedure including the risks, benefits and alternatives for the proposed anesthesia with the patient or authorized representative who has indicated his/her understanding and acceptance.     Plan Discussed with:   Anesthesia Plan Comments:         Anesthesia Quick Evaluation

## 2016-04-26 NOTE — Op Note (Signed)
OPERATIVE REPORT  PREOPERATIVE  DIAGNOSIS: . Right breast cancer  POSTOPERATIVE DIAGNOSIS: . Right breast cancer  PROCEDURE: . Right partial mastectomy with sentinel lymph node biopsy  ANESTHESIA:  General  SURGEON: Rochel Brome  MD   INDICATIONS: . She had recent findings of a 1.3 cm mass upper inner quadrant of right breast. She had core needle biopsy and pathology demonstrated invasive mammary carcinoma. Surgery was recommended for definitive treatment. She did have preoperative insertion of Kopan's wire and injection of radioactive technetium sulfur colloid  With the patient on the operating table in the supine position under general anesthesia the dressing was removed from the upper aspect of the right breast and identified the Kopan's wire which entered the breast at approximately 1:30 position. The wire was cut 2 cm from the skin. The breast was examined with ultrasound and identified the [facility] in the upper inner quadrant. The breast was prepared with ChloraPrep and draped in a sterile manner.  A curvilinear incision was made in the upper inner quadrant to remove a narrow ellipse of skin with the underlying mass. The dissection was carried down through subcutaneous tissues. Electrocautery was used for hemostasis. The wire was encountered. A mass of tissue surrounding the wire was excised. It was marked with margin maps using 3-0 nylon suture to attach markers to the medial lateral cranial caudal and deep margins. The skin ellipse remained in continuity with the underlying specimen. There was some palpable firmness within the specimen. Margins appeared to be grossly negative. The mass of tissue was submitted for specimen mammogram and pathology. The radiologist called to indicate that the biopsy marker was seen within the specimen. The pathologist called to report that margins appeared to be negative. The wound was inspected and found that hemostasis was intact.  Attention was turned to  the axilla which was probed with a gamma counter demonstrating location of radioactivity in the inferior aspect of the axilla. An oblique 5 cm incision was made in the inferior aspect of the axilla and carried down through subcutaneous tissues. Electrocautery was used for hemostasis. Dissection was carried down deeply adjacent to the rib cage to locate a lymph node with radioactivity. This lymph node was removed with some surrounding fatty tissue and did have some firmness. The ex vivo counts per second was between 120 and 150. The background count was less than than 10. There was no remaining palpable mass. The wound was inspected and could see hemostasis was intact. Superficial fascia was closed with 3-0 chromic. The subcutaneous tissues tissues were infiltrated with half percent Sensorcaine with epinephrine. Attention was turned back to the partial mastectomy wound which was inspected and could see hemostasis was intact subcutaneous tissues and deeper tissues were infiltrated with half percent Sensorcaine with epinephrine. The subcutaneous tissues were approximated with interrupted 4-0 chromic sutures. The skin was closed with running 4-0 Monocryl subcuticular suture.  Next the axillary wound was closed with running 4-0 Monocryl subcuticular suture. Both wounds were dressed with Dermabond and allowed to dry  The patient tolerated surgery satisfactorily and was then prepared for transfer to the recovery room.  Rochel Brome M.D.

## 2016-04-26 NOTE — Discharge Instructions (Addendum)
AMBULATORY SURGERY  DISCHARGE INSTRUCTIONS   1) The drugs that you were given will stay in your system until tomorrow so for the next 24 hours you should not:  A) Drive an automobile B) Make any legal decisions C) Drink any alcoholic beverage   2) You may resume regular meals tomorrow.  Today it is better to start with liquids and gradually work up to solid foods.  You may eat anything you prefer, but it is better to start with liquids, then soup and crackers, and gradually work up to solid foods.   3) Please notify your doctor immediately if you have any unusual bleeding, trouble breathing, redness and pain at the surgery site, drainage, fever, or pain not relieved by medication.    4) Additional Instructions:        Please contact your physician with any problems or Same Day Surgery at [phone], Monday through Friday 6 am to 4 pm, or [place] at [facility] number at [phone].Take Tylenol or Norco if needed for pain.  Should not drive or do anything dangerous when taking Norco.  Resume aspirin and Plavix on Thursday.  May shower and blot dry.  Wear bra is desired for support and comfort.

## 2016-04-26 NOTE — Progress Notes (Signed)
  Oncology Nurse Navigator Documentation  Navigator Location: [facility] Onc (04/26/16 1500)   )Navigator Encounter Type: Follow-up Appt (04/26/16 1500)                     Patient Visit Type: Surgery (04/26/16 1500)                              Time Spent with Patient: 7 (04/26/16 1500)  Met with family and offered support  while patient in surgery.

## 2016-04-26 NOTE — Transfer of Care (Signed)
Immediate Anesthesia Transfer of Care Note  Patient: DONN WILMOT  Procedure(s) Performed: Procedure(s): PARTIAL MASTECTOMY WITH NEEDLE LOCALIZATION (Right) SENTINEL NODE BIOPSY (Right)  Patient Location: PACU  Anesthesia Type:General  Level of Consciousness: awake, alert , oriented and patient cooperative  Airway & Oxygen Therapy: Patient Spontanous Breathing and Patient connected to face mask oxygen  Post-op Assessment: Report given to RN, Post -op Vital signs reviewed and stable and Patient moving all extremities X 4  Post vital signs: Reviewed and stable  Last Vitals:  Vitals:   04/26/16 0936  BP: (!) 157/61  Pulse: (!) 55  Resp: 17  Temp: 36.6 C    Last Pain:  Vitals:   04/26/16 0936  TempSrc: Oral  PainSc: 0-No pain      Patients Stated Pain Goal: 0 (16/55/37 4827)  Complications: No apparent anesthesia complications

## 2016-04-26 NOTE — Anesthesia Procedure Notes (Signed)
Procedure Name: LMA Insertion Date/Time: 04/26/2016 12:57 PM Performed by: Silvana Newness Pre-anesthesia Checklist: Patient identified, Emergency Drugs available, Suction available, Patient being monitored and Timeout performed Patient Re-evaluated:Patient Re-evaluated prior to inductionOxygen Delivery Method: Circle system utilized Preoxygenation: Pre-oxygenation with 100% oxygen Intubation Type: IV induction Ventilation: Oral airway inserted - appropriate to patient size LMA: LMA inserted LMA Size: 4.0 Number of attempts: 1 Placement Confirmation: positive ETCO2 and breath sounds checked- equal and bilateral Tube secured with: Tape Dental Injury: Teeth and Oropharynx as per pre-operative assessment

## 2016-04-27 ENCOUNTER — Encounter: Payer: Self-pay | Admitting: Surgery

## 2016-04-27 NOTE — Progress Notes (Signed)
  Oncology Nurse Navigator Documentation  Navigator Location: [facility] (04/27/16 1700)   )Navigator Encounter Type: Telephone (04/27/16 1700)                     Patient Visit Type: Surgery;Follow-up (04/27/16 1700)                              Time Spent with Patient: 15 (04/27/16 1700)  Post op phone call to patient.   Per daughter, she is doing well with minimal discomfort.  Reminded to call with questions, or needs.

## 2016-04-28 LAB — SURGICAL PATHOLOGY

## 2016-05-11 ENCOUNTER — Ambulatory Visit: Payer: Commercial Managed Care - HMO | Admitting: Oncology

## 2016-05-11 DIAGNOSIS — G4733 Obstructive sleep apnea (adult) (pediatric): Secondary | ICD-10-CM | POA: Diagnosis not present

## 2016-05-11 NOTE — Progress Notes (Signed)
[facility]  [phone] [phone]) [phone]  ID: Rachel Jones OB: 1933-07-25  MR#: [id number]  ZMO#:[id number]  Patient Care Team: Idelle Crouch, MD as PCP - General (Internal Medicine)  CHIEF COMPLAINT: Pathologic stage IA ER positive, PR, HER-2 negative invasive carcinoma of the upper inner quadrant of right breast.  INTERVAL HISTORY: Patient returns to clinic today to discuss her final pathology results and treatment planning. She continues to feel well and is asymptomatic. She has no neurologic complaints. She denies any recent fevers or illnesses. She has a good appetite and denies weight loss. She has no chest pain or shortness of breath. She denies any nausea, vomiting, constipation, or diarrhea. She has no urinary complaints. Patient offers no specific complaints today.  REVIEW OF SYSTEMS:   Review of Systems  Constitutional: Negative.  Negative for fever, malaise/fatigue and weight loss.  Respiratory: Negative.  Negative for cough.   Cardiovascular: Negative.  Negative for chest pain and leg swelling.  Gastrointestinal: Negative.  Negative for abdominal pain.  Genitourinary: Negative.   Musculoskeletal: Negative.   Neurological: Negative for sensory change and weakness.  Psychiatric/Behavioral: Negative.  The patient is not nervous/anxious.     As per HPI. Otherwise, a complete review of systems is negative.  PAST MEDICAL HISTORY: Past Medical History:  Diagnosis Date  . Anemia    is followed by renal doctor and pcp  . Arthritis   . Cancer [facility]) 1961   uterine cancer  . Coronary artery disease   . Dyspnea   . GERD (gastroesophageal reflux disease)   . Hypertension   . Kidney disease    stage 4.  followed by dr. Sedonia Small  . Lower extremity edema   . S/P lumpectomy, right breast    having done on 04/26/16  . Sleep apnea    uses cpap    PAST SURGICAL HISTORY: Past Surgical History:  Procedure Laterality Date  . BREAST  LUMPECTOMY Right    04/26/16  . CAROTID STENT Left 2016   was having pain down neck. carotid artery clogged. dr. Delana Meyer put stent in  . CAROTID STENT Left   . CATARACT EXTRACTION W/ INTRAOCULAR LENS  IMPLANT, BILATERAL Bilateral 2009  . EYE SURGERY  2009   cataracts  . PARTIAL HYSTERECTOMY  1961   uterus only removed  . PARTIAL MASTECTOMY WITH NEEDLE LOCALIZATION Right 04/26/2016   Procedure: PARTIAL MASTECTOMY WITH NEEDLE LOCALIZATION;  Surgeon: Leonie Green, MD;  Location: [facility] ORS;  Service: General;  Laterality: Right;  . SENTINEL NODE BIOPSY Right 04/26/2016   Procedure: SENTINEL NODE BIOPSY;  Surgeon: Leonie Green, MD;  Location: [facility] ORS;  Service: General;  Laterality: Right;    FAMILY HISTORY: No family history on file.  ADVANCED DIRECTIVES (Y/N):  N  HEALTH MAINTENANCE: Social History  Substance Use Topics  . Smoking status: Never Smoker  . Smokeless tobacco: Never Used  . Alcohol use No     Colonoscopy:  PAP:  Bone density:  Lipid panel:  Allergies  Allergen Reactions  . Codeine Nausea Only    Intolerance instead of true allerg  . Sulfa Antibiotics Swelling and Rash    swelling    Current Outpatient Prescriptions  Medication Sig Dispense Refill  . acetaminophen (TYLENOL) 500 MG tablet Take 1,000 mg by mouth daily. May take an additional 1000 mgs as needed for pain    . aspirin EC 81 MG tablet Take 81 mg by mouth at bedtime.     . Cholecalciferol (  VITAMIN D3) 1000 units CAPS Take 1,000 Units by mouth daily.     . clopidogrel (PLAVIX) 75 MG tablet TAKE 1 TABLET EVERY DAY 90 tablet 3  . Cyanocobalamin (VITAMIN B-12 IJ) Inject 1,000 mcg as directed every 30 (thirty) days.    Marland Kitchen esomeprazole (NEXIUM) 40 MG capsule Take 40 mg by mouth daily.    . ferrous gluconate (FERGON) 324 MG tablet Take 324 mg by mouth daily.    . furosemide (LASIX) 20 MG tablet Take 20 mg by mouth daily as needed for edema.     Marland Kitchen losartan (COZAAR) 50 MG tablet Take 50 mg by  mouth daily.    Marland Kitchen lovastatin (MEVACOR) 40 MG tablet Take 40 mg by mouth 2 (two) times daily.    . metoprolol tartrate (LOPRESSOR) 25 MG tablet Take 25 mg by mouth 2 (two) times daily.    Marland Kitchen HYDROcodone-acetaminophen (NORCO) 5-325 MG tablet Take 1-2 tablets by mouth every 4 (four) hours as needed for moderate pain. (Patient not taking: Reported on 05/12/2016) 12 tablet 0  . ondansetron (ZOFRAN) 4 MG/2ML SOLN injection Inject 2 mLs (4 mg total) into the vein once as needed for nausea. (Patient not taking: Reported on 05/12/2016) 2 mL 0   No current facility-administered medications for this visit.     OBJECTIVE: There were no vitals filed for this visit.   There is no height or weight on file to calculate BMI.    ECOG FS:0 - Asymptomatic  General: Well-developed, well-nourished, no acute distress. Eyes: Pink conjunctiva, anicteric sclera. Breasts: Well-healed surgical scar on right breast. Lungs: Clear to auscultation bilaterally. Heart: Regular rate and rhythm. No rubs, murmurs, or gallops. Abdomen: Soft, nontender, nondistended. No organomegaly noted, normoactive bowel sounds. Musculoskeletal: No edema, cyanosis, or clubbing. Neuro: Alert, answering all questions appropriately. Cranial nerves grossly intact. Skin: No rashes or petechiae noted. Psych: Normal affect.   LAB RESULTS:  Lab Results  Component Value Date   NA 141 04/18/2016   K 4.9 04/18/2016   CL 108 04/18/2016   CO2 25 04/18/2016   GLUCOSE 100 (H) 04/18/2016   BUN 36 (H) 04/18/2016   CREATININE 1.83 (H) 04/18/2016   CALCIUM 8.7 (L) 04/18/2016   GFRNONAA 25 (L) 04/18/2016   GFRAA 29 (L) 04/18/2016    Lab Results  Component Value Date   WBC 6.3 04/18/2016   NEUTROABS 5.0 05/28/2014   HGB 11.9 (L) 04/18/2016   HCT 35.4 04/18/2016   MCV 91.5 04/18/2016   PLT 186 04/18/2016     STUDIES: Nm Sentinel Node Injection  Result Date: 04/26/2016 CLINICAL DATA:  Right breast cancer. EXAM: NUCLEAR MEDICINE BREAST  LYMPHOSCINTIGRAPHY TECHNIQUE: Right side of the breast was prepped with chlorhexidine. Skin was anesthetized with 1% lidocaine. Intradermal injection of radiopharmaceutical was performed at the 9 o'clock position around the right nipple. The patient was then sent to the operating room where the sentinel node(s) were identified and removed by the surgeon. RADIOPHARMACEUTICALS:  Total of 1.008 mCi Millipore-filtered Technetium-20msulfur colloid IMPRESSION: Uncomplicated intradermal injection of a total of 1 mCi Technetium-964mulfur colloid for purposes of sentinel node identification. Electronically Signed   By: AdMarkus Daft.D.   On: 04/26/2016 10:32   Mm Breast Surgical Specimen  Result Date: 04/26/2016 CLINICAL DATA:  Status post excision of a right breast lesion. EXAM: SPECIMEN RADIOGRAPH OF THE RIGHT BREAST COMPARISON:  Previous exam(s). FINDINGS: Status post excision of the right breast. The wire tip and biopsy marker clip are present and are marked  for pathology. IMPRESSION: Specimen radiograph of the right breast. Electronically Signed   By: Lajean Manes M.D.   On: 04/26/2016 13:48   Mm Rt Plc Breast Loc Dev   1st Lesion  Inc Mammo Guide  Result Date: 04/26/2016 CLINICAL DATA:  Patient presents for mammographically guided needle localization of a right breast lesion. EXAM: NEEDLE LOCALIZATION OF THE RIGHT BREAST WITH MAMMO GUIDANCE COMPARISON:  Previous exams. FINDINGS: Patient presents for needle localization prior to surgical excision. I met with the patient and we discussed the procedure of needle localization including benefits and alternatives. We discussed the high likelihood of a successful procedure. We discussed the risks of the procedure, including infection, bleeding, tissue injury, and further surgery. Informed, written consent was given. The usual time-out protocol was performed immediately prior to the procedure. Using mammographic guidance, sterile technique, 1% lidocaine and a 7 cm  modified Kopans needle, the biopsy clip in the upper right breast localized using cranial to caudal approach. The images were marked for Dr. Tamala Julian. IMPRESSION: Needle localization the right breast. No apparent complications. Electronically Signed   By: Lajean Manes M.D.   On: 04/26/2016 08:45    ASSESSMENT: Pathologic stage IA ER positive, PR HER-2 negative invasive carcinoma of the upper inner quadrant of right breast. High-risk Mammoprint  PLAN:    1. Pathologic stage IA ER positive, PR HER-2 negative invasive carcinoma of the upper inner quadrant of right breast: Final pathology is reviewed independently. Although patient returned high risk Mammoprint, it was determined given her advanced age as well as multiple comorbidities that chemotherapy was not in her best interest. She had consultation with radiation oncology today and we will pursue adjuvant XRT. Return to clinic to first week of March for further evaluation and to initiate an aromatase inhibitor which she will take for a total of 5 years.   Approximately 30 minutes was spent in discussion of which greater than 50% was consultation.  Patient expressed understanding and was in agreement with this plan. She also understands that She can call clinic at any time with any questions, concerns, or complaints.   Primary cancer of upper inner quadrant of right female breast [facility])   Staging form: Breast, AJCC 7th Edition   - Pathologic stage from 05/15/2016: Stage IA (T1c, N0, cM0) - Signed by Lloyd Huger, MD on 05/15/2016  Lloyd Huger, MD   05/15/2016 8:47 AM

## 2016-05-12 ENCOUNTER — Encounter: Payer: Self-pay | Admitting: *Deleted

## 2016-05-12 ENCOUNTER — Ambulatory Visit
Admission: RE | Admit: 2016-05-12 | Discharge: 2016-05-12 | Disposition: A | Discharge status: Home / Self Care | Payer: Medicare HMO | Source: Ambulatory Visit | Attending: Radiation Oncology | Admitting: Radiation Oncology

## 2016-05-12 ENCOUNTER — Inpatient Hospital Stay: Payer: Medicare HMO | Attending: Oncology | Admitting: Oncology

## 2016-05-12 ENCOUNTER — Encounter: Payer: Self-pay | Admitting: Radiation Oncology

## 2016-05-12 VITALS — BP 164/85 | HR 64 | Temp 96.4°F | Wt 199.0 lb

## 2016-05-12 DIAGNOSIS — Z8542 Personal history of malignant neoplasm of other parts of uterus: Secondary | ICD-10-CM | POA: Diagnosis not present

## 2016-05-12 DIAGNOSIS — K219 Gastro-esophageal reflux disease without esophagitis: Secondary | ICD-10-CM | POA: Diagnosis not present

## 2016-05-12 DIAGNOSIS — Z170 Estrogen receptor positive status [ER+]: Secondary | ICD-10-CM | POA: Diagnosis not present

## 2016-05-12 DIAGNOSIS — R609 Edema, unspecified: Secondary | ICD-10-CM | POA: Insufficient documentation

## 2016-05-12 DIAGNOSIS — Z79899 Other long term (current) drug therapy: Secondary | ICD-10-CM | POA: Insufficient documentation

## 2016-05-12 DIAGNOSIS — C50211 Malignant neoplasm of upper-inner quadrant of right female breast: Secondary | ICD-10-CM | POA: Diagnosis not present

## 2016-05-12 DIAGNOSIS — M129 Arthropathy, unspecified: Secondary | ICD-10-CM | POA: Diagnosis not present

## 2016-05-12 DIAGNOSIS — I2510 Atherosclerotic heart disease of native coronary artery without angina pectoris: Secondary | ICD-10-CM | POA: Diagnosis not present

## 2016-05-12 DIAGNOSIS — Z510 Encounter for antineoplastic radiation therapy: Secondary | ICD-10-CM | POA: Diagnosis not present

## 2016-05-12 DIAGNOSIS — N184 Chronic kidney disease, stage 4 (severe): Secondary | ICD-10-CM | POA: Diagnosis not present

## 2016-05-12 DIAGNOSIS — Z7982 Long term (current) use of aspirin: Secondary | ICD-10-CM | POA: Insufficient documentation

## 2016-05-12 DIAGNOSIS — D508 Other iron deficiency anemias: Secondary | ICD-10-CM | POA: Diagnosis not present

## 2016-05-12 DIAGNOSIS — R8290 Unspecified abnormal findings in urine: Secondary | ICD-10-CM | POA: Diagnosis not present

## 2016-05-12 DIAGNOSIS — I129 Hypertensive chronic kidney disease with stage 1 through stage 4 chronic kidney disease, or unspecified chronic kidney disease: Secondary | ICD-10-CM | POA: Insufficient documentation

## 2016-05-12 DIAGNOSIS — D649 Anemia, unspecified: Secondary | ICD-10-CM | POA: Insufficient documentation

## 2016-05-12 DIAGNOSIS — Z9011 Acquired absence of right breast and nipple: Secondary | ICD-10-CM | POA: Diagnosis not present

## 2016-05-12 DIAGNOSIS — G4730 Sleep apnea, unspecified: Secondary | ICD-10-CM

## 2016-05-12 DIAGNOSIS — E782 Mixed hyperlipidemia: Secondary | ICD-10-CM | POA: Diagnosis not present

## 2016-05-12 DIAGNOSIS — R0602 Shortness of breath: Secondary | ICD-10-CM | POA: Insufficient documentation

## 2016-05-12 DIAGNOSIS — D631 Anemia in chronic kidney disease: Secondary | ICD-10-CM | POA: Diagnosis not present

## 2016-05-12 DIAGNOSIS — I10 Essential (primary) hypertension: Secondary | ICD-10-CM | POA: Diagnosis not present

## 2016-05-12 NOTE — Consult Note (Signed)
NEW PATIENT EVALUATION  Name: Rachel Jones  MRN: [id number]  Date:   05/12/2016     DOB: [birth year]   This 82 y.o. 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  REFERRING PHYSICIAN: Idelle Crouch, MD  CHIEF COMPLAINT:  Chief Complaint  Patient presents with  . Breast Cancer    Initial evaluation for radiation therapy    DIAGNOSIS: The encounter diagnosis was Malignant neoplasm of upper-inner quadrant of right breast in female, estrogen receptor positive ([name]).   PREVIOUS INVESTIGATIONS:  Pathology reports reviewed Ultrasound and mammograms reviewed Clinical notes reviewed  HPI: Patient is a 82 year old female who presented with an abnormal mammogram of the right breast at the 1:30 position 8 cm from the nipple measuring approximate 1.1 cm confirmed on ultrasound. She underwent ultrasound-guided biopsy which was positive for ER/PR positive HER-2/neu negative invasive mammary carcinoma. She went on to have a wide local excision and sentinel node biopsy. Tumor was 1.1 cm overall nuclear grade 2 invasive mammary carcinoma with one sentinel lymph node negative for malignancy. Margins were clear at 6 mm. Again tumor was ER/PR positive. Patient has stage IV renal disease. MammaPrint was ordered showing high risk of recurrence although given the patient's tumor stage age and renal status medical oncology does not want to pursue systemic chemotherapy. She is seen today for consultation is doing well. She specifically denies breast tenderness cough or bone pain.  PLANNED TREATMENT REGIMEN: Whole breast radiation  PAST MEDICAL HISTORY:  has a past medical history of Anemia; Arthritis; Cancer ([place]) (1961); Coronary artery disease; Dyspnea; GERD (gastroesophageal reflux disease); Hypertension; Kidney disease; Lower extremity edema; S/P lumpectomy, right breast; and  Sleep apnea.    PAST SURGICAL HISTORY:  Past Surgical History:  Procedure Laterality Date  . BREAST LUMPECTOMY Right    04/26/16  . CAROTID STENT Left 2016   was having pain down neck. carotid artery clogged. dr. Delana Meyer put stent in  . CAROTID STENT Left   . CATARACT EXTRACTION W/ INTRAOCULAR LENS  IMPLANT, BILATERAL Bilateral 2009  . EYE SURGERY  2009   cataracts  . PARTIAL HYSTERECTOMY  1961   uterus only removed  . PARTIAL MASTECTOMY WITH NEEDLE LOCALIZATION Right 04/26/2016   Procedure: PARTIAL MASTECTOMY WITH NEEDLE LOCALIZATION;  Surgeon: Leonie Green, MD;  Location: [facility] ORS;  Service: General;  Laterality: Right;  . SENTINEL NODE BIOPSY Right 04/26/2016   Procedure: SENTINEL NODE BIOPSY;  Surgeon: Leonie Green, MD;  Location: [facility] ORS;  Service: General;  Laterality: Right;    FAMILY HISTORY: family history is not on file.  SOCIAL HISTORY:  reports that she has never smoked. She has never used smokeless tobacco. She reports that she does not drink alcohol or use drugs.  ALLERGIES: Codeine and Sulfa antibiotics  MEDICATIONS:  Current Outpatient Prescriptions  Medication Sig Dispense Refill  . acetaminophen (TYLENOL) 500 MG tablet Take 1,000 mg by mouth daily. May take an additional 1000 mgs as needed for pain    . aspirin EC 81 MG tablet Take 81 mg by mouth at bedtime.     . Cholecalciferol (VITAMIN D3) 1000 units CAPS Take 1,000 Units by mouth daily.     . clopidogrel (PLAVIX) 75 MG tablet TAKE 1 TABLET EVERY DAY 90 tablet 3  . Cyanocobalamin (VITAMIN B-12 IJ) Inject 1,000 mcg as directed every 30 (thirty) days.    Marland Kitchen esomeprazole (  NEXIUM) 40 MG capsule Take 40 mg by mouth daily.    . ferrous gluconate (FERGON) 324 MG tablet Take 324 mg by mouth daily.    . furosemide (LASIX) 20 MG tablet Take 20 mg by mouth daily as needed for edema.     Marland Kitchen HYDROcodone-acetaminophen (NORCO) 5-325 MG tablet Take 1-2 tablets by mouth every 4 (four) hours as needed for moderate  pain. (Patient not taking: Reported on 05/12/2016) 12 tablet 0  . losartan (COZAAR) 50 MG tablet Take 50 mg by mouth daily.    Marland Kitchen lovastatin (MEVACOR) 40 MG tablet Take 40 mg by mouth 2 (two) times daily.    . metoprolol tartrate (LOPRESSOR) 25 MG tablet Take 25 mg by mouth 2 (two) times daily.    . ondansetron (ZOFRAN) 4 MG/2ML SOLN injection Inject 2 mLs (4 mg total) into the vein once as needed for nausea. (Patient not taking: Reported on 05/12/2016) 2 mL 0   No current facility-administered medications for this encounter.     ECOG PERFORMANCE STATUS:  0 - Asymptomatic  REVIEW OF SYSTEMS:  Patient denies any weight loss, fatigue, weakness, fever, chills or night sweats. Patient denies any loss of vision, blurred vision. Patient denies any ringing  of the ears or hearing loss. No irregular heartbeat. Patient denies heart murmur or history of fainting. Patient denies any chest pain or pain radiating to her upper extremities. Patient denies any shortness of breath, difficulty breathing at night, cough or hemoptysis. Patient denies any swelling in the lower legs. Patient denies any nausea vomiting, vomiting of blood, or coffee ground material in the vomitus. Patient denies any stomach pain. Patient states has had normal bowel movements no significant constipation or diarrhea. Patient denies any dysuria, hematuria or significant nocturia. Patient denies any problems walking, swelling in the joints or loss of balance. Patient denies any skin changes, loss of hair or loss of weight. Patient denies any excessive worrying or anxiety or significant depression. Patient denies any problems with insomnia. Patient denies excessive thirst, polyuria, polydipsia. Patient denies any swollen glands, patient denies easy bruising or easy bleeding. Patient denies any recent infections, allergies or URI. Patient "s visual fields have not changed significantly in recent time.    PHYSICAL EXAM: BP (!) 164/85   Pulse 64    Temp (!) 96.4 F (35.8 C)   Wt 198 lb 15.4 oz (90.2 kg)   BMI 36.39 kg/m  Patient is status post wide local excision of the right breast. Patient patient's breasts are rather large and pendulous. No dominant mass or nodularity is noted in either breast in 2 positions examined. Her incisions are healing well. No axillary or supraclavicular adenopathy is appreciated. Well-developed well-nourished patient in NAD. HEENT reveals PERLA, EOMI, discs not visualized.  Oral cavity is clear. No oral mucosal lesions are identified. Neck is clear without evidence of cervical or supraclavicular adenopathy. Lungs are clear to A&P. Cardiac examination is essentially unremarkable with regular rate and rhythm without murmur rub or thrill. Abdomen is benign with no organomegaly or masses noted. Motor sensory and DTR levels are equal and symmetric in the upper and lower extremities. Cranial nerves II through XII are grossly intact. Proprioception is intact. No peripheral adenopathy or edema is identified. No motor or sensory levels are noted. Crude visual fields are within normal range.  LABORATORY DATA: Pathology reports reviewed    RADIOLOGY RESULTS: Mammograms and ultrasound reviewed   IMPRESSION: Stage I a ER/PR positive HER-2/neu negative invasive mammary carcinoma the right  breast status post wide local excision and sentinel node biopsy in 82 year old female.  PLAN: At this time I concur based on her age tumor size ER/PR positive status to forego systemic chemotherapy. I have recommended whole breast radiation. Her breasts are too large and pendulous for hypofractionated course of treatment. Would plan on delivering 5040 cGy in 20 age's fractions. Would also boost her scar another 1400 cGy using electrons. Risks and benefits of treatment including skin reaction fatigue alteration of blood counts possible inclusion of superficial lung all were discussed in detail with the patient. I personally ordered CT simulation  for next week after the Christmas holiday. Case was personally discussed with medical oncology.  I would like to take this opportunity to thank you for allowing me to participate in the care of your patient.Armstead Peaks., MD

## 2016-05-12 NOTE — Progress Notes (Signed)
Renal MD asked patient to ask today of oncologist is in agreement with patient continuing to take [facility]?

## 2016-05-12 NOTE — Progress Notes (Signed)
  Oncology Nurse Navigator Documentation  Navigator Location: [facility] (05/12/16 1300)   )Navigator Encounter Type: Clinic/MDC (05/12/16 1300)    Met patient and her son today during her follow-up appointment for treatment planning with Dr. Grayland Ormond.  Although her mammoprint score was high, patient is not a candidate for chemotherapy per Dr. Grayland Ormond.  Patient is to start radiation therapy and then antihormal therapy.  She is to call for any questions or needs.    Patient Visit Type: MedOnc;Follow-up (05/12/16 1300) Treatment Phase: Follow-up (05/12/16 1300) Barriers/Navigation Needs: No Needs (05/12/16 1300)   Interventions: Other (offered support) (05/12/16 1300)                      Time Spent with Patient: 45 (05/12/16 1300)

## 2016-05-13 ENCOUNTER — Institutional Professional Consult (permissible substitution): Payer: Commercial Managed Care - HMO | Admitting: Radiation Oncology

## 2016-05-19 ENCOUNTER — Ambulatory Visit
Admission: RE | Admit: 2016-05-19 | Discharge: 2016-05-19 | Disposition: A | Discharge status: Home / Self Care | Payer: Medicare HMO | Source: Ambulatory Visit | Attending: Radiation Oncology | Admitting: Radiation Oncology

## 2016-05-19 DIAGNOSIS — Z510 Encounter for antineoplastic radiation therapy: Secondary | ICD-10-CM | POA: Diagnosis not present

## 2016-05-19 DIAGNOSIS — I2510 Atherosclerotic heart disease of native coronary artery without angina pectoris: Secondary | ICD-10-CM | POA: Diagnosis not present

## 2016-05-19 DIAGNOSIS — I129 Hypertensive chronic kidney disease with stage 1 through stage 4 chronic kidney disease, or unspecified chronic kidney disease: Secondary | ICD-10-CM | POA: Diagnosis not present

## 2016-05-19 DIAGNOSIS — E538 Deficiency of other specified B group vitamins: Secondary | ICD-10-CM | POA: Diagnosis not present

## 2016-05-19 DIAGNOSIS — D649 Anemia, unspecified: Secondary | ICD-10-CM | POA: Diagnosis not present

## 2016-05-19 DIAGNOSIS — C50211 Malignant neoplasm of upper-inner quadrant of right female breast: Secondary | ICD-10-CM | POA: Diagnosis not present

## 2016-05-19 DIAGNOSIS — R0602 Shortness of breath: Secondary | ICD-10-CM | POA: Diagnosis not present

## 2016-05-19 DIAGNOSIS — Z170 Estrogen receptor positive status [ER+]: Secondary | ICD-10-CM | POA: Diagnosis not present

## 2016-05-19 DIAGNOSIS — N184 Chronic kidney disease, stage 4 (severe): Secondary | ICD-10-CM | POA: Diagnosis not present

## 2016-05-19 DIAGNOSIS — M129 Arthropathy, unspecified: Secondary | ICD-10-CM | POA: Diagnosis not present

## 2016-05-24 DIAGNOSIS — I129 Hypertensive chronic kidney disease with stage 1 through stage 4 chronic kidney disease, or unspecified chronic kidney disease: Secondary | ICD-10-CM | POA: Diagnosis not present

## 2016-05-24 DIAGNOSIS — C50211 Malignant neoplasm of upper-inner quadrant of right female breast: Secondary | ICD-10-CM | POA: Diagnosis not present

## 2016-05-24 DIAGNOSIS — Z170 Estrogen receptor positive status [ER+]: Secondary | ICD-10-CM | POA: Diagnosis not present

## 2016-05-24 DIAGNOSIS — M129 Arthropathy, unspecified: Secondary | ICD-10-CM | POA: Diagnosis not present

## 2016-05-24 DIAGNOSIS — N184 Chronic kidney disease, stage 4 (severe): Secondary | ICD-10-CM | POA: Diagnosis not present

## 2016-05-24 DIAGNOSIS — I2510 Atherosclerotic heart disease of native coronary artery without angina pectoris: Secondary | ICD-10-CM | POA: Diagnosis not present

## 2016-05-24 DIAGNOSIS — Z510 Encounter for antineoplastic radiation therapy: Secondary | ICD-10-CM | POA: Diagnosis not present

## 2016-05-24 DIAGNOSIS — R0602 Shortness of breath: Secondary | ICD-10-CM | POA: Diagnosis not present

## 2016-05-24 DIAGNOSIS — D649 Anemia, unspecified: Secondary | ICD-10-CM | POA: Diagnosis not present

## 2016-05-27 ENCOUNTER — Other Ambulatory Visit: Payer: Self-pay | Admitting: *Deleted

## 2016-05-27 DIAGNOSIS — C50211 Malignant neoplasm of upper-inner quadrant of right female breast: Secondary | ICD-10-CM

## 2016-05-31 ENCOUNTER — Ambulatory Visit: Admission: RE | Admit: 2016-05-31 | Payer: Medicare HMO | Source: Ambulatory Visit

## 2016-06-01 ENCOUNTER — Ambulatory Visit
Admission: RE | Admit: 2016-06-01 | Discharge: 2016-06-01 | Disposition: A | Discharge status: Home / Self Care | Payer: Medicare HMO | Source: Ambulatory Visit | Attending: Radiation Oncology | Admitting: Radiation Oncology

## 2016-06-01 DIAGNOSIS — Z170 Estrogen receptor positive status [ER+]: Secondary | ICD-10-CM | POA: Diagnosis not present

## 2016-06-01 DIAGNOSIS — M129 Arthropathy, unspecified: Secondary | ICD-10-CM | POA: Diagnosis not present

## 2016-06-01 DIAGNOSIS — D649 Anemia, unspecified: Secondary | ICD-10-CM | POA: Diagnosis not present

## 2016-06-01 DIAGNOSIS — I129 Hypertensive chronic kidney disease with stage 1 through stage 4 chronic kidney disease, or unspecified chronic kidney disease: Secondary | ICD-10-CM | POA: Diagnosis not present

## 2016-06-01 DIAGNOSIS — R0602 Shortness of breath: Secondary | ICD-10-CM | POA: Diagnosis not present

## 2016-06-01 DIAGNOSIS — Z510 Encounter for antineoplastic radiation therapy: Secondary | ICD-10-CM | POA: Diagnosis not present

## 2016-06-01 DIAGNOSIS — C50211 Malignant neoplasm of upper-inner quadrant of right female breast: Secondary | ICD-10-CM | POA: Diagnosis not present

## 2016-06-01 DIAGNOSIS — I2510 Atherosclerotic heart disease of native coronary artery without angina pectoris: Secondary | ICD-10-CM | POA: Diagnosis not present

## 2016-06-01 DIAGNOSIS — N184 Chronic kidney disease, stage 4 (severe): Secondary | ICD-10-CM | POA: Diagnosis not present

## 2016-06-02 ENCOUNTER — Ambulatory Visit
Admission: RE | Admit: 2016-06-02 | Discharge: 2016-06-02 | Disposition: A | Discharge status: Home / Self Care | Payer: Medicare HMO | Source: Ambulatory Visit | Attending: Radiation Oncology | Admitting: Radiation Oncology

## 2016-06-02 DIAGNOSIS — Z170 Estrogen receptor positive status [ER+]: Secondary | ICD-10-CM | POA: Diagnosis not present

## 2016-06-02 DIAGNOSIS — Z510 Encounter for antineoplastic radiation therapy: Secondary | ICD-10-CM | POA: Diagnosis not present

## 2016-06-02 DIAGNOSIS — D649 Anemia, unspecified: Secondary | ICD-10-CM | POA: Diagnosis not present

## 2016-06-02 DIAGNOSIS — C50211 Malignant neoplasm of upper-inner quadrant of right female breast: Secondary | ICD-10-CM | POA: Diagnosis not present

## 2016-06-02 DIAGNOSIS — I129 Hypertensive chronic kidney disease with stage 1 through stage 4 chronic kidney disease, or unspecified chronic kidney disease: Secondary | ICD-10-CM | POA: Diagnosis not present

## 2016-06-02 DIAGNOSIS — R0602 Shortness of breath: Secondary | ICD-10-CM | POA: Diagnosis not present

## 2016-06-02 DIAGNOSIS — I2510 Atherosclerotic heart disease of native coronary artery without angina pectoris: Secondary | ICD-10-CM | POA: Diagnosis not present

## 2016-06-02 DIAGNOSIS — N184 Chronic kidney disease, stage 4 (severe): Secondary | ICD-10-CM | POA: Diagnosis not present

## 2016-06-02 DIAGNOSIS — M129 Arthropathy, unspecified: Secondary | ICD-10-CM | POA: Diagnosis not present

## 2016-06-03 ENCOUNTER — Ambulatory Visit
Admission: RE | Admit: 2016-06-03 | Discharge: 2016-06-03 | Disposition: A | Discharge status: Home / Self Care | Payer: Medicare HMO | Source: Ambulatory Visit | Attending: Radiation Oncology | Admitting: Radiation Oncology

## 2016-06-03 DIAGNOSIS — Z170 Estrogen receptor positive status [ER+]: Secondary | ICD-10-CM | POA: Diagnosis not present

## 2016-06-03 DIAGNOSIS — D649 Anemia, unspecified: Secondary | ICD-10-CM | POA: Diagnosis not present

## 2016-06-03 DIAGNOSIS — C50211 Malignant neoplasm of upper-inner quadrant of right female breast: Secondary | ICD-10-CM | POA: Diagnosis not present

## 2016-06-03 DIAGNOSIS — Z510 Encounter for antineoplastic radiation therapy: Secondary | ICD-10-CM | POA: Diagnosis not present

## 2016-06-03 DIAGNOSIS — R0602 Shortness of breath: Secondary | ICD-10-CM | POA: Diagnosis not present

## 2016-06-03 DIAGNOSIS — M129 Arthropathy, unspecified: Secondary | ICD-10-CM | POA: Diagnosis not present

## 2016-06-03 DIAGNOSIS — I129 Hypertensive chronic kidney disease with stage 1 through stage 4 chronic kidney disease, or unspecified chronic kidney disease: Secondary | ICD-10-CM | POA: Diagnosis not present

## 2016-06-03 DIAGNOSIS — I2510 Atherosclerotic heart disease of native coronary artery without angina pectoris: Secondary | ICD-10-CM | POA: Diagnosis not present

## 2016-06-03 DIAGNOSIS — N184 Chronic kidney disease, stage 4 (severe): Secondary | ICD-10-CM | POA: Diagnosis not present

## 2016-06-06 ENCOUNTER — Ambulatory Visit
Admission: RE | Admit: 2016-06-06 | Discharge: 2016-06-06 | Disposition: A | Discharge status: Home / Self Care | Payer: Medicare HMO | Source: Ambulatory Visit | Attending: Radiation Oncology | Admitting: Radiation Oncology

## 2016-06-06 DIAGNOSIS — Z170 Estrogen receptor positive status [ER+]: Secondary | ICD-10-CM | POA: Diagnosis not present

## 2016-06-06 DIAGNOSIS — I2510 Atherosclerotic heart disease of native coronary artery without angina pectoris: Secondary | ICD-10-CM | POA: Diagnosis not present

## 2016-06-06 DIAGNOSIS — R0602 Shortness of breath: Secondary | ICD-10-CM | POA: Diagnosis not present

## 2016-06-06 DIAGNOSIS — C50211 Malignant neoplasm of upper-inner quadrant of right female breast: Secondary | ICD-10-CM | POA: Diagnosis not present

## 2016-06-06 DIAGNOSIS — I129 Hypertensive chronic kidney disease with stage 1 through stage 4 chronic kidney disease, or unspecified chronic kidney disease: Secondary | ICD-10-CM | POA: Diagnosis not present

## 2016-06-06 DIAGNOSIS — Z510 Encounter for antineoplastic radiation therapy: Secondary | ICD-10-CM | POA: Diagnosis not present

## 2016-06-06 DIAGNOSIS — N184 Chronic kidney disease, stage 4 (severe): Secondary | ICD-10-CM | POA: Diagnosis not present

## 2016-06-06 DIAGNOSIS — M129 Arthropathy, unspecified: Secondary | ICD-10-CM | POA: Diagnosis not present

## 2016-06-06 DIAGNOSIS — D649 Anemia, unspecified: Secondary | ICD-10-CM | POA: Diagnosis not present

## 2016-06-07 ENCOUNTER — Ambulatory Visit
Admission: RE | Admit: 2016-06-07 | Discharge: 2016-06-07 | Disposition: A | Discharge status: Home / Self Care | Payer: Medicare HMO | Source: Ambulatory Visit | Attending: Radiation Oncology | Admitting: Radiation Oncology

## 2016-06-07 DIAGNOSIS — Z510 Encounter for antineoplastic radiation therapy: Secondary | ICD-10-CM | POA: Diagnosis not present

## 2016-06-07 DIAGNOSIS — R0602 Shortness of breath: Secondary | ICD-10-CM | POA: Diagnosis not present

## 2016-06-07 DIAGNOSIS — I2510 Atherosclerotic heart disease of native coronary artery without angina pectoris: Secondary | ICD-10-CM | POA: Diagnosis not present

## 2016-06-07 DIAGNOSIS — D649 Anemia, unspecified: Secondary | ICD-10-CM | POA: Diagnosis not present

## 2016-06-07 DIAGNOSIS — N184 Chronic kidney disease, stage 4 (severe): Secondary | ICD-10-CM | POA: Diagnosis not present

## 2016-06-07 DIAGNOSIS — Z170 Estrogen receptor positive status [ER+]: Secondary | ICD-10-CM | POA: Diagnosis not present

## 2016-06-07 DIAGNOSIS — M129 Arthropathy, unspecified: Secondary | ICD-10-CM | POA: Diagnosis not present

## 2016-06-07 DIAGNOSIS — C50211 Malignant neoplasm of upper-inner quadrant of right female breast: Secondary | ICD-10-CM | POA: Diagnosis not present

## 2016-06-07 DIAGNOSIS — I129 Hypertensive chronic kidney disease with stage 1 through stage 4 chronic kidney disease, or unspecified chronic kidney disease: Secondary | ICD-10-CM | POA: Diagnosis not present

## 2016-06-08 ENCOUNTER — Ambulatory Visit: Payer: Medicare HMO

## 2016-06-09 ENCOUNTER — Ambulatory Visit: Payer: Medicare HMO

## 2016-06-10 ENCOUNTER — Ambulatory Visit: Payer: Medicare HMO

## 2016-06-11 DIAGNOSIS — G4733 Obstructive sleep apnea (adult) (pediatric): Secondary | ICD-10-CM | POA: Diagnosis not present

## 2016-06-13 ENCOUNTER — Ambulatory Visit
Admission: RE | Admit: 2016-06-13 | Discharge: 2016-06-13 | Disposition: A | Discharge status: Home / Self Care | Payer: Medicare HMO | Source: Ambulatory Visit | Attending: Radiation Oncology | Admitting: Radiation Oncology

## 2016-06-13 DIAGNOSIS — C50211 Malignant neoplasm of upper-inner quadrant of right female breast: Secondary | ICD-10-CM | POA: Diagnosis not present

## 2016-06-13 DIAGNOSIS — D649 Anemia, unspecified: Secondary | ICD-10-CM | POA: Diagnosis not present

## 2016-06-13 DIAGNOSIS — Z170 Estrogen receptor positive status [ER+]: Secondary | ICD-10-CM | POA: Diagnosis not present

## 2016-06-13 DIAGNOSIS — I2510 Atherosclerotic heart disease of native coronary artery without angina pectoris: Secondary | ICD-10-CM | POA: Diagnosis not present

## 2016-06-13 DIAGNOSIS — M129 Arthropathy, unspecified: Secondary | ICD-10-CM | POA: Diagnosis not present

## 2016-06-13 DIAGNOSIS — N184 Chronic kidney disease, stage 4 (severe): Secondary | ICD-10-CM | POA: Diagnosis not present

## 2016-06-13 DIAGNOSIS — R0602 Shortness of breath: Secondary | ICD-10-CM | POA: Diagnosis not present

## 2016-06-13 DIAGNOSIS — I129 Hypertensive chronic kidney disease with stage 1 through stage 4 chronic kidney disease, or unspecified chronic kidney disease: Secondary | ICD-10-CM | POA: Diagnosis not present

## 2016-06-13 DIAGNOSIS — Z510 Encounter for antineoplastic radiation therapy: Secondary | ICD-10-CM | POA: Diagnosis not present

## 2016-06-14 ENCOUNTER — Ambulatory Visit
Admission: RE | Admit: 2016-06-14 | Discharge: 2016-06-14 | Disposition: A | Discharge status: Home / Self Care | Payer: Medicare HMO | Source: Ambulatory Visit | Attending: Radiation Oncology | Admitting: Radiation Oncology

## 2016-06-14 DIAGNOSIS — N184 Chronic kidney disease, stage 4 (severe): Secondary | ICD-10-CM | POA: Diagnosis not present

## 2016-06-14 DIAGNOSIS — C50211 Malignant neoplasm of upper-inner quadrant of right female breast: Secondary | ICD-10-CM | POA: Diagnosis not present

## 2016-06-14 DIAGNOSIS — R0602 Shortness of breath: Secondary | ICD-10-CM | POA: Diagnosis not present

## 2016-06-14 DIAGNOSIS — Z510 Encounter for antineoplastic radiation therapy: Secondary | ICD-10-CM | POA: Diagnosis not present

## 2016-06-14 DIAGNOSIS — M129 Arthropathy, unspecified: Secondary | ICD-10-CM | POA: Diagnosis not present

## 2016-06-14 DIAGNOSIS — Z170 Estrogen receptor positive status [ER+]: Secondary | ICD-10-CM | POA: Diagnosis not present

## 2016-06-14 DIAGNOSIS — I2510 Atherosclerotic heart disease of native coronary artery without angina pectoris: Secondary | ICD-10-CM | POA: Diagnosis not present

## 2016-06-14 DIAGNOSIS — D649 Anemia, unspecified: Secondary | ICD-10-CM | POA: Diagnosis not present

## 2016-06-14 DIAGNOSIS — I129 Hypertensive chronic kidney disease with stage 1 through stage 4 chronic kidney disease, or unspecified chronic kidney disease: Secondary | ICD-10-CM | POA: Diagnosis not present

## 2016-06-15 ENCOUNTER — Ambulatory Visit
Admission: RE | Admit: 2016-06-15 | Discharge: 2016-06-15 | Disposition: A | Discharge status: Home / Self Care | Payer: Medicare HMO | Source: Ambulatory Visit | Attending: Radiation Oncology | Admitting: Radiation Oncology

## 2016-06-15 ENCOUNTER — Inpatient Hospital Stay: Payer: Medicare HMO | Attending: Radiation Oncology

## 2016-06-15 DIAGNOSIS — C50211 Malignant neoplasm of upper-inner quadrant of right female breast: Secondary | ICD-10-CM

## 2016-06-15 DIAGNOSIS — D649 Anemia, unspecified: Secondary | ICD-10-CM | POA: Diagnosis not present

## 2016-06-15 DIAGNOSIS — M129 Arthropathy, unspecified: Secondary | ICD-10-CM | POA: Diagnosis not present

## 2016-06-15 DIAGNOSIS — I2510 Atherosclerotic heart disease of native coronary artery without angina pectoris: Secondary | ICD-10-CM | POA: Diagnosis not present

## 2016-06-15 DIAGNOSIS — I129 Hypertensive chronic kidney disease with stage 1 through stage 4 chronic kidney disease, or unspecified chronic kidney disease: Secondary | ICD-10-CM | POA: Diagnosis not present

## 2016-06-15 DIAGNOSIS — E538 Deficiency of other specified B group vitamins: Secondary | ICD-10-CM | POA: Diagnosis not present

## 2016-06-15 DIAGNOSIS — Z510 Encounter for antineoplastic radiation therapy: Secondary | ICD-10-CM | POA: Diagnosis not present

## 2016-06-15 DIAGNOSIS — R0602 Shortness of breath: Secondary | ICD-10-CM | POA: Diagnosis not present

## 2016-06-15 DIAGNOSIS — N184 Chronic kidney disease, stage 4 (severe): Secondary | ICD-10-CM | POA: Diagnosis not present

## 2016-06-15 DIAGNOSIS — Z170 Estrogen receptor positive status [ER+]: Secondary | ICD-10-CM | POA: Diagnosis not present

## 2016-06-15 LAB — CBC
HCT: 34.3 % — ABNORMAL LOW (ref 35.0–47.0)
Hemoglobin: 11.7 g/dL — ABNORMAL LOW (ref 12.0–16.0)
MCH: 30.5 pg (ref 26.0–34.0)
MCHC: 34.1 g/dL (ref 32.0–36.0)
MCV: 89.6 fL (ref 80.0–100.0)
PLATELETS: 181 10*3/uL (ref 150–440)
RBC: 3.83 MIL/uL (ref 3.80–5.20)
RDW: 13.2 % (ref 11.5–14.5)
WBC: 5.8 10*3/uL (ref 3.6–11.0)

## 2016-06-16 ENCOUNTER — Ambulatory Visit
Admission: RE | Admit: 2016-06-16 | Discharge: 2016-06-16 | Disposition: A | Discharge status: Home / Self Care | Payer: Medicare HMO | Source: Ambulatory Visit | Attending: Radiation Oncology | Admitting: Radiation Oncology

## 2016-06-16 DIAGNOSIS — I129 Hypertensive chronic kidney disease with stage 1 through stage 4 chronic kidney disease, or unspecified chronic kidney disease: Secondary | ICD-10-CM | POA: Diagnosis not present

## 2016-06-16 DIAGNOSIS — N184 Chronic kidney disease, stage 4 (severe): Secondary | ICD-10-CM | POA: Diagnosis not present

## 2016-06-16 DIAGNOSIS — C50211 Malignant neoplasm of upper-inner quadrant of right female breast: Secondary | ICD-10-CM | POA: Diagnosis not present

## 2016-06-16 DIAGNOSIS — M129 Arthropathy, unspecified: Secondary | ICD-10-CM | POA: Diagnosis not present

## 2016-06-16 DIAGNOSIS — R0602 Shortness of breath: Secondary | ICD-10-CM | POA: Diagnosis not present

## 2016-06-16 DIAGNOSIS — D649 Anemia, unspecified: Secondary | ICD-10-CM | POA: Diagnosis not present

## 2016-06-16 DIAGNOSIS — Z170 Estrogen receptor positive status [ER+]: Secondary | ICD-10-CM | POA: Diagnosis not present

## 2016-06-16 DIAGNOSIS — I2510 Atherosclerotic heart disease of native coronary artery without angina pectoris: Secondary | ICD-10-CM | POA: Diagnosis not present

## 2016-06-16 DIAGNOSIS — Z510 Encounter for antineoplastic radiation therapy: Secondary | ICD-10-CM | POA: Diagnosis not present

## 2016-06-17 ENCOUNTER — Ambulatory Visit: Payer: Medicare HMO

## 2016-06-17 DIAGNOSIS — M129 Arthropathy, unspecified: Secondary | ICD-10-CM | POA: Diagnosis not present

## 2016-06-17 DIAGNOSIS — C50211 Malignant neoplasm of upper-inner quadrant of right female breast: Secondary | ICD-10-CM | POA: Diagnosis not present

## 2016-06-17 DIAGNOSIS — Z170 Estrogen receptor positive status [ER+]: Secondary | ICD-10-CM | POA: Diagnosis not present

## 2016-06-17 DIAGNOSIS — R0602 Shortness of breath: Secondary | ICD-10-CM | POA: Diagnosis not present

## 2016-06-17 DIAGNOSIS — N184 Chronic kidney disease, stage 4 (severe): Secondary | ICD-10-CM | POA: Diagnosis not present

## 2016-06-17 DIAGNOSIS — D649 Anemia, unspecified: Secondary | ICD-10-CM | POA: Diagnosis not present

## 2016-06-17 DIAGNOSIS — I129 Hypertensive chronic kidney disease with stage 1 through stage 4 chronic kidney disease, or unspecified chronic kidney disease: Secondary | ICD-10-CM | POA: Diagnosis not present

## 2016-06-17 DIAGNOSIS — I2510 Atherosclerotic heart disease of native coronary artery without angina pectoris: Secondary | ICD-10-CM | POA: Diagnosis not present

## 2016-06-17 DIAGNOSIS — Z510 Encounter for antineoplastic radiation therapy: Secondary | ICD-10-CM | POA: Diagnosis not present

## 2016-06-20 ENCOUNTER — Ambulatory Visit: Payer: Medicare HMO

## 2016-06-20 DIAGNOSIS — I129 Hypertensive chronic kidney disease with stage 1 through stage 4 chronic kidney disease, or unspecified chronic kidney disease: Secondary | ICD-10-CM | POA: Diagnosis not present

## 2016-06-20 DIAGNOSIS — D649 Anemia, unspecified: Secondary | ICD-10-CM | POA: Diagnosis not present

## 2016-06-20 DIAGNOSIS — Z170 Estrogen receptor positive status [ER+]: Secondary | ICD-10-CM | POA: Diagnosis not present

## 2016-06-20 DIAGNOSIS — I2510 Atherosclerotic heart disease of native coronary artery without angina pectoris: Secondary | ICD-10-CM | POA: Diagnosis not present

## 2016-06-20 DIAGNOSIS — M129 Arthropathy, unspecified: Secondary | ICD-10-CM | POA: Diagnosis not present

## 2016-06-20 DIAGNOSIS — N184 Chronic kidney disease, stage 4 (severe): Secondary | ICD-10-CM | POA: Diagnosis not present

## 2016-06-20 DIAGNOSIS — R0602 Shortness of breath: Secondary | ICD-10-CM | POA: Diagnosis not present

## 2016-06-20 DIAGNOSIS — C50211 Malignant neoplasm of upper-inner quadrant of right female breast: Secondary | ICD-10-CM | POA: Diagnosis not present

## 2016-06-20 DIAGNOSIS — Z510 Encounter for antineoplastic radiation therapy: Secondary | ICD-10-CM | POA: Diagnosis not present

## 2016-06-21 ENCOUNTER — Ambulatory Visit
Admission: RE | Admit: 2016-06-21 | Discharge: 2016-06-21 | Disposition: A | Discharge status: Home / Self Care | Payer: Medicare HMO | Source: Ambulatory Visit | Attending: Radiation Oncology | Admitting: Radiation Oncology

## 2016-06-21 DIAGNOSIS — D649 Anemia, unspecified: Secondary | ICD-10-CM | POA: Diagnosis not present

## 2016-06-21 DIAGNOSIS — Z170 Estrogen receptor positive status [ER+]: Secondary | ICD-10-CM | POA: Diagnosis not present

## 2016-06-21 DIAGNOSIS — M129 Arthropathy, unspecified: Secondary | ICD-10-CM | POA: Diagnosis not present

## 2016-06-21 DIAGNOSIS — R0602 Shortness of breath: Secondary | ICD-10-CM | POA: Diagnosis not present

## 2016-06-21 DIAGNOSIS — N184 Chronic kidney disease, stage 4 (severe): Secondary | ICD-10-CM | POA: Diagnosis not present

## 2016-06-21 DIAGNOSIS — I2510 Atherosclerotic heart disease of native coronary artery without angina pectoris: Secondary | ICD-10-CM | POA: Diagnosis not present

## 2016-06-21 DIAGNOSIS — Z510 Encounter for antineoplastic radiation therapy: Secondary | ICD-10-CM | POA: Diagnosis not present

## 2016-06-21 DIAGNOSIS — I129 Hypertensive chronic kidney disease with stage 1 through stage 4 chronic kidney disease, or unspecified chronic kidney disease: Secondary | ICD-10-CM | POA: Diagnosis not present

## 2016-06-21 DIAGNOSIS — C50211 Malignant neoplasm of upper-inner quadrant of right female breast: Secondary | ICD-10-CM | POA: Diagnosis not present

## 2016-06-22 ENCOUNTER — Ambulatory Visit
Admission: RE | Admit: 2016-06-22 | Discharge: 2016-06-22 | Disposition: A | Discharge status: Home / Self Care | Payer: Medicare HMO | Source: Ambulatory Visit | Attending: Radiation Oncology | Admitting: Radiation Oncology

## 2016-06-22 DIAGNOSIS — Z510 Encounter for antineoplastic radiation therapy: Secondary | ICD-10-CM | POA: Diagnosis not present

## 2016-06-22 DIAGNOSIS — C50211 Malignant neoplasm of upper-inner quadrant of right female breast: Secondary | ICD-10-CM | POA: Diagnosis not present

## 2016-06-22 DIAGNOSIS — I129 Hypertensive chronic kidney disease with stage 1 through stage 4 chronic kidney disease, or unspecified chronic kidney disease: Secondary | ICD-10-CM | POA: Diagnosis not present

## 2016-06-22 DIAGNOSIS — R0602 Shortness of breath: Secondary | ICD-10-CM | POA: Diagnosis not present

## 2016-06-22 DIAGNOSIS — N184 Chronic kidney disease, stage 4 (severe): Secondary | ICD-10-CM | POA: Diagnosis not present

## 2016-06-22 DIAGNOSIS — I2510 Atherosclerotic heart disease of native coronary artery without angina pectoris: Secondary | ICD-10-CM | POA: Diagnosis not present

## 2016-06-22 DIAGNOSIS — D649 Anemia, unspecified: Secondary | ICD-10-CM | POA: Diagnosis not present

## 2016-06-22 DIAGNOSIS — Z170 Estrogen receptor positive status [ER+]: Secondary | ICD-10-CM | POA: Diagnosis not present

## 2016-06-22 DIAGNOSIS — M129 Arthropathy, unspecified: Secondary | ICD-10-CM | POA: Diagnosis not present

## 2016-06-23 ENCOUNTER — Ambulatory Visit
Admission: RE | Admit: 2016-06-23 | Discharge: 2016-06-23 | Disposition: A | Discharge status: Home / Self Care | Payer: Medicare HMO | Source: Ambulatory Visit | Attending: Radiation Oncology | Admitting: Radiation Oncology

## 2016-06-23 DIAGNOSIS — I129 Hypertensive chronic kidney disease with stage 1 through stage 4 chronic kidney disease, or unspecified chronic kidney disease: Secondary | ICD-10-CM | POA: Diagnosis not present

## 2016-06-23 DIAGNOSIS — Z510 Encounter for antineoplastic radiation therapy: Secondary | ICD-10-CM | POA: Diagnosis not present

## 2016-06-23 DIAGNOSIS — R0602 Shortness of breath: Secondary | ICD-10-CM | POA: Diagnosis not present

## 2016-06-23 DIAGNOSIS — C50211 Malignant neoplasm of upper-inner quadrant of right female breast: Secondary | ICD-10-CM | POA: Diagnosis not present

## 2016-06-23 DIAGNOSIS — Z170 Estrogen receptor positive status [ER+]: Secondary | ICD-10-CM | POA: Diagnosis not present

## 2016-06-23 DIAGNOSIS — I2510 Atherosclerotic heart disease of native coronary artery without angina pectoris: Secondary | ICD-10-CM | POA: Diagnosis not present

## 2016-06-23 DIAGNOSIS — M129 Arthropathy, unspecified: Secondary | ICD-10-CM | POA: Diagnosis not present

## 2016-06-23 DIAGNOSIS — D649 Anemia, unspecified: Secondary | ICD-10-CM | POA: Diagnosis not present

## 2016-06-23 DIAGNOSIS — N184 Chronic kidney disease, stage 4 (severe): Secondary | ICD-10-CM | POA: Diagnosis not present

## 2016-06-24 ENCOUNTER — Ambulatory Visit
Admission: RE | Admit: 2016-06-24 | Discharge: 2016-06-24 | Disposition: A | Discharge status: Home / Self Care | Payer: Medicare HMO | Source: Ambulatory Visit | Attending: Radiation Oncology | Admitting: Radiation Oncology

## 2016-06-24 DIAGNOSIS — M129 Arthropathy, unspecified: Secondary | ICD-10-CM | POA: Diagnosis not present

## 2016-06-24 DIAGNOSIS — C50211 Malignant neoplasm of upper-inner quadrant of right female breast: Secondary | ICD-10-CM | POA: Diagnosis not present

## 2016-06-24 DIAGNOSIS — D649 Anemia, unspecified: Secondary | ICD-10-CM | POA: Diagnosis not present

## 2016-06-24 DIAGNOSIS — Z510 Encounter for antineoplastic radiation therapy: Secondary | ICD-10-CM | POA: Diagnosis not present

## 2016-06-24 DIAGNOSIS — Z170 Estrogen receptor positive status [ER+]: Secondary | ICD-10-CM | POA: Diagnosis not present

## 2016-06-24 DIAGNOSIS — R0602 Shortness of breath: Secondary | ICD-10-CM | POA: Diagnosis not present

## 2016-06-24 DIAGNOSIS — I2510 Atherosclerotic heart disease of native coronary artery without angina pectoris: Secondary | ICD-10-CM | POA: Diagnosis not present

## 2016-06-24 DIAGNOSIS — I129 Hypertensive chronic kidney disease with stage 1 through stage 4 chronic kidney disease, or unspecified chronic kidney disease: Secondary | ICD-10-CM | POA: Diagnosis not present

## 2016-06-24 DIAGNOSIS — N184 Chronic kidney disease, stage 4 (severe): Secondary | ICD-10-CM | POA: Diagnosis not present

## 2016-06-27 ENCOUNTER — Ambulatory Visit
Admission: RE | Admit: 2016-06-27 | Discharge: 2016-06-27 | Disposition: A | Discharge status: Home / Self Care | Payer: Medicare HMO | Source: Ambulatory Visit | Attending: Radiation Oncology | Admitting: Radiation Oncology

## 2016-06-27 DIAGNOSIS — I129 Hypertensive chronic kidney disease with stage 1 through stage 4 chronic kidney disease, or unspecified chronic kidney disease: Secondary | ICD-10-CM | POA: Diagnosis not present

## 2016-06-27 DIAGNOSIS — Z170 Estrogen receptor positive status [ER+]: Secondary | ICD-10-CM | POA: Diagnosis not present

## 2016-06-27 DIAGNOSIS — Z510 Encounter for antineoplastic radiation therapy: Secondary | ICD-10-CM | POA: Diagnosis not present

## 2016-06-27 DIAGNOSIS — C50211 Malignant neoplasm of upper-inner quadrant of right female breast: Secondary | ICD-10-CM | POA: Diagnosis not present

## 2016-06-27 DIAGNOSIS — M129 Arthropathy, unspecified: Secondary | ICD-10-CM | POA: Diagnosis not present

## 2016-06-27 DIAGNOSIS — I2510 Atherosclerotic heart disease of native coronary artery without angina pectoris: Secondary | ICD-10-CM | POA: Diagnosis not present

## 2016-06-27 DIAGNOSIS — D649 Anemia, unspecified: Secondary | ICD-10-CM | POA: Diagnosis not present

## 2016-06-27 DIAGNOSIS — N184 Chronic kidney disease, stage 4 (severe): Secondary | ICD-10-CM | POA: Diagnosis not present

## 2016-06-27 DIAGNOSIS — R0602 Shortness of breath: Secondary | ICD-10-CM | POA: Diagnosis not present

## 2016-06-28 ENCOUNTER — Ambulatory Visit
Admission: RE | Admit: 2016-06-28 | Discharge: 2016-06-28 | Disposition: A | Discharge status: Home / Self Care | Payer: Medicare HMO | Source: Ambulatory Visit | Attending: Radiation Oncology | Admitting: Radiation Oncology

## 2016-06-28 DIAGNOSIS — N184 Chronic kidney disease, stage 4 (severe): Secondary | ICD-10-CM | POA: Diagnosis not present

## 2016-06-28 DIAGNOSIS — C50211 Malignant neoplasm of upper-inner quadrant of right female breast: Secondary | ICD-10-CM | POA: Diagnosis not present

## 2016-06-28 DIAGNOSIS — I129 Hypertensive chronic kidney disease with stage 1 through stage 4 chronic kidney disease, or unspecified chronic kidney disease: Secondary | ICD-10-CM | POA: Diagnosis not present

## 2016-06-28 DIAGNOSIS — Z87440 Personal history of urinary (tract) infections: Secondary | ICD-10-CM | POA: Diagnosis not present

## 2016-06-28 DIAGNOSIS — R0602 Shortness of breath: Secondary | ICD-10-CM | POA: Diagnosis not present

## 2016-06-28 DIAGNOSIS — M129 Arthropathy, unspecified: Secondary | ICD-10-CM | POA: Diagnosis not present

## 2016-06-28 DIAGNOSIS — D649 Anemia, unspecified: Secondary | ICD-10-CM | POA: Diagnosis not present

## 2016-06-28 DIAGNOSIS — I10 Essential (primary) hypertension: Secondary | ICD-10-CM | POA: Diagnosis not present

## 2016-06-28 DIAGNOSIS — Z510 Encounter for antineoplastic radiation therapy: Secondary | ICD-10-CM | POA: Diagnosis not present

## 2016-06-28 DIAGNOSIS — I2510 Atherosclerotic heart disease of native coronary artery without angina pectoris: Secondary | ICD-10-CM | POA: Diagnosis not present

## 2016-06-28 DIAGNOSIS — Z170 Estrogen receptor positive status [ER+]: Secondary | ICD-10-CM | POA: Diagnosis not present

## 2016-06-28 DIAGNOSIS — M069 Rheumatoid arthritis, unspecified: Secondary | ICD-10-CM | POA: Diagnosis not present

## 2016-06-28 DIAGNOSIS — N390 Urinary tract infection, site not specified: Secondary | ICD-10-CM | POA: Diagnosis not present

## 2016-06-28 DIAGNOSIS — E538 Deficiency of other specified B group vitamins: Secondary | ICD-10-CM | POA: Diagnosis not present

## 2016-06-29 ENCOUNTER — Inpatient Hospital Stay: Payer: Medicare HMO | Attending: Radiation Oncology

## 2016-06-29 ENCOUNTER — Ambulatory Visit: Payer: Medicare HMO

## 2016-06-29 ENCOUNTER — Ambulatory Visit
Admission: RE | Admit: 2016-06-29 | Discharge: 2016-06-29 | Disposition: A | Discharge status: Home / Self Care | Payer: Medicare HMO | Source: Ambulatory Visit | Attending: Radiation Oncology | Admitting: Radiation Oncology

## 2016-06-29 DIAGNOSIS — Z170 Estrogen receptor positive status [ER+]: Secondary | ICD-10-CM | POA: Diagnosis not present

## 2016-06-29 DIAGNOSIS — C50211 Malignant neoplasm of upper-inner quadrant of right female breast: Secondary | ICD-10-CM

## 2016-06-29 DIAGNOSIS — D649 Anemia, unspecified: Secondary | ICD-10-CM | POA: Diagnosis not present

## 2016-06-29 DIAGNOSIS — I129 Hypertensive chronic kidney disease with stage 1 through stage 4 chronic kidney disease, or unspecified chronic kidney disease: Secondary | ICD-10-CM | POA: Diagnosis not present

## 2016-06-29 DIAGNOSIS — N184 Chronic kidney disease, stage 4 (severe): Secondary | ICD-10-CM | POA: Diagnosis not present

## 2016-06-29 DIAGNOSIS — E538 Deficiency of other specified B group vitamins: Secondary | ICD-10-CM | POA: Diagnosis not present

## 2016-06-29 DIAGNOSIS — R0602 Shortness of breath: Secondary | ICD-10-CM | POA: Diagnosis not present

## 2016-06-29 DIAGNOSIS — Z510 Encounter for antineoplastic radiation therapy: Secondary | ICD-10-CM | POA: Diagnosis not present

## 2016-06-29 DIAGNOSIS — I2510 Atherosclerotic heart disease of native coronary artery without angina pectoris: Secondary | ICD-10-CM | POA: Diagnosis not present

## 2016-06-29 DIAGNOSIS — M129 Arthropathy, unspecified: Secondary | ICD-10-CM | POA: Diagnosis not present

## 2016-06-29 LAB — CBC
HEMATOCRIT: 36.4 % (ref 35.0–47.0)
Hemoglobin: 12.4 g/dL (ref 12.0–16.0)
MCH: 31 pg (ref 26.0–34.0)
MCHC: 34 g/dL (ref 32.0–36.0)
MCV: 91.1 fL (ref 80.0–100.0)
PLATELETS: 198 10*3/uL (ref 150–440)
RBC: 3.99 MIL/uL (ref 3.80–5.20)
RDW: 13.7 % (ref 11.5–14.5)
WBC: 6.9 10*3/uL (ref 3.6–11.0)

## 2016-06-30 ENCOUNTER — Ambulatory Visit
Admission: RE | Admit: 2016-06-30 | Discharge: 2016-06-30 | Disposition: A | Discharge status: Home / Self Care | Payer: Medicare HMO | Source: Ambulatory Visit | Attending: Radiation Oncology | Admitting: Radiation Oncology

## 2016-06-30 DIAGNOSIS — I2510 Atherosclerotic heart disease of native coronary artery without angina pectoris: Secondary | ICD-10-CM | POA: Diagnosis not present

## 2016-06-30 DIAGNOSIS — Z510 Encounter for antineoplastic radiation therapy: Secondary | ICD-10-CM | POA: Diagnosis not present

## 2016-06-30 DIAGNOSIS — D649 Anemia, unspecified: Secondary | ICD-10-CM | POA: Diagnosis not present

## 2016-06-30 DIAGNOSIS — C50211 Malignant neoplasm of upper-inner quadrant of right female breast: Secondary | ICD-10-CM | POA: Diagnosis not present

## 2016-06-30 DIAGNOSIS — R0602 Shortness of breath: Secondary | ICD-10-CM | POA: Diagnosis not present

## 2016-06-30 DIAGNOSIS — I129 Hypertensive chronic kidney disease with stage 1 through stage 4 chronic kidney disease, or unspecified chronic kidney disease: Secondary | ICD-10-CM | POA: Diagnosis not present

## 2016-06-30 DIAGNOSIS — M129 Arthropathy, unspecified: Secondary | ICD-10-CM | POA: Diagnosis not present

## 2016-06-30 DIAGNOSIS — N184 Chronic kidney disease, stage 4 (severe): Secondary | ICD-10-CM | POA: Diagnosis not present

## 2016-06-30 DIAGNOSIS — Z170 Estrogen receptor positive status [ER+]: Secondary | ICD-10-CM | POA: Diagnosis not present

## 2016-07-01 ENCOUNTER — Ambulatory Visit
Admission: RE | Admit: 2016-07-01 | Discharge: 2016-07-01 | Disposition: A | Discharge status: Home / Self Care | Payer: Medicare HMO | Source: Ambulatory Visit | Attending: Radiation Oncology | Admitting: Radiation Oncology

## 2016-07-01 ENCOUNTER — Other Ambulatory Visit: Payer: Self-pay | Admitting: *Deleted

## 2016-07-01 DIAGNOSIS — R0602 Shortness of breath: Secondary | ICD-10-CM | POA: Diagnosis not present

## 2016-07-01 DIAGNOSIS — I2510 Atherosclerotic heart disease of native coronary artery without angina pectoris: Secondary | ICD-10-CM | POA: Diagnosis not present

## 2016-07-01 DIAGNOSIS — D649 Anemia, unspecified: Secondary | ICD-10-CM | POA: Diagnosis not present

## 2016-07-01 DIAGNOSIS — C50211 Malignant neoplasm of upper-inner quadrant of right female breast: Secondary | ICD-10-CM | POA: Diagnosis not present

## 2016-07-01 DIAGNOSIS — N184 Chronic kidney disease, stage 4 (severe): Secondary | ICD-10-CM | POA: Diagnosis not present

## 2016-07-01 DIAGNOSIS — Z510 Encounter for antineoplastic radiation therapy: Secondary | ICD-10-CM | POA: Diagnosis not present

## 2016-07-01 DIAGNOSIS — M129 Arthropathy, unspecified: Secondary | ICD-10-CM | POA: Diagnosis not present

## 2016-07-01 DIAGNOSIS — Z170 Estrogen receptor positive status [ER+]: Secondary | ICD-10-CM | POA: Diagnosis not present

## 2016-07-01 DIAGNOSIS — I129 Hypertensive chronic kidney disease with stage 1 through stage 4 chronic kidney disease, or unspecified chronic kidney disease: Secondary | ICD-10-CM | POA: Diagnosis not present

## 2016-07-01 MED ORDER — SILVER SULFADIAZINE 1 % EX CREA: 1.0000 "application " | TOPICAL_CREAM | Freq: Two times a day (BID) | CUTANEOUS | 0 refills | Status: DC

## 2016-07-04 ENCOUNTER — Ambulatory Visit
Admission: RE | Admit: 2016-07-04 | Discharge: 2016-07-04 | Disposition: A | Discharge status: Home / Self Care | Payer: Medicare HMO | Source: Ambulatory Visit | Attending: Radiation Oncology | Admitting: Radiation Oncology

## 2016-07-04 ENCOUNTER — Ambulatory Visit: Admission: RE | Admit: 2016-07-04 | Payer: Medicare HMO | Source: Ambulatory Visit

## 2016-07-04 DIAGNOSIS — Z510 Encounter for antineoplastic radiation therapy: Secondary | ICD-10-CM | POA: Diagnosis not present

## 2016-07-04 DIAGNOSIS — I129 Hypertensive chronic kidney disease with stage 1 through stage 4 chronic kidney disease, or unspecified chronic kidney disease: Secondary | ICD-10-CM | POA: Diagnosis not present

## 2016-07-04 DIAGNOSIS — M129 Arthropathy, unspecified: Secondary | ICD-10-CM | POA: Diagnosis not present

## 2016-07-04 DIAGNOSIS — R0602 Shortness of breath: Secondary | ICD-10-CM | POA: Diagnosis not present

## 2016-07-04 DIAGNOSIS — D649 Anemia, unspecified: Secondary | ICD-10-CM | POA: Diagnosis not present

## 2016-07-04 DIAGNOSIS — Z170 Estrogen receptor positive status [ER+]: Secondary | ICD-10-CM | POA: Diagnosis not present

## 2016-07-04 DIAGNOSIS — N184 Chronic kidney disease, stage 4 (severe): Secondary | ICD-10-CM | POA: Diagnosis not present

## 2016-07-04 DIAGNOSIS — C50211 Malignant neoplasm of upper-inner quadrant of right female breast: Secondary | ICD-10-CM | POA: Diagnosis not present

## 2016-07-04 DIAGNOSIS — I2510 Atherosclerotic heart disease of native coronary artery without angina pectoris: Secondary | ICD-10-CM | POA: Diagnosis not present

## 2016-07-05 ENCOUNTER — Ambulatory Visit: Payer: Medicare HMO

## 2016-07-05 ENCOUNTER — Ambulatory Visit
Admission: RE | Admit: 2016-07-05 | Discharge: 2016-07-05 | Disposition: A | Discharge status: Home / Self Care | Payer: Medicare HMO | Source: Ambulatory Visit | Attending: Radiation Oncology | Admitting: Radiation Oncology

## 2016-07-05 DIAGNOSIS — R0602 Shortness of breath: Secondary | ICD-10-CM | POA: Diagnosis not present

## 2016-07-05 DIAGNOSIS — I129 Hypertensive chronic kidney disease with stage 1 through stage 4 chronic kidney disease, or unspecified chronic kidney disease: Secondary | ICD-10-CM | POA: Diagnosis not present

## 2016-07-05 DIAGNOSIS — I2510 Atherosclerotic heart disease of native coronary artery without angina pectoris: Secondary | ICD-10-CM | POA: Diagnosis not present

## 2016-07-05 DIAGNOSIS — C50211 Malignant neoplasm of upper-inner quadrant of right female breast: Secondary | ICD-10-CM | POA: Diagnosis not present

## 2016-07-05 DIAGNOSIS — Z510 Encounter for antineoplastic radiation therapy: Secondary | ICD-10-CM | POA: Diagnosis not present

## 2016-07-05 DIAGNOSIS — D649 Anemia, unspecified: Secondary | ICD-10-CM | POA: Diagnosis not present

## 2016-07-05 DIAGNOSIS — Z170 Estrogen receptor positive status [ER+]: Secondary | ICD-10-CM | POA: Diagnosis not present

## 2016-07-05 DIAGNOSIS — M129 Arthropathy, unspecified: Secondary | ICD-10-CM | POA: Diagnosis not present

## 2016-07-05 DIAGNOSIS — N184 Chronic kidney disease, stage 4 (severe): Secondary | ICD-10-CM | POA: Diagnosis not present

## 2016-07-06 ENCOUNTER — Ambulatory Visit: Payer: Medicare HMO

## 2016-07-06 ENCOUNTER — Ambulatory Visit
Admission: RE | Admit: 2016-07-06 | Discharge: 2016-07-06 | Disposition: A | Discharge status: Home / Self Care | Payer: Medicare HMO | Source: Ambulatory Visit | Attending: Radiation Oncology | Admitting: Radiation Oncology

## 2016-07-07 ENCOUNTER — Ambulatory Visit
Admission: RE | Admit: 2016-07-07 | Discharge: 2016-07-07 | Disposition: A | Discharge status: Home / Self Care | Payer: Medicare HMO | Source: Ambulatory Visit | Attending: Radiation Oncology | Admitting: Radiation Oncology

## 2016-07-07 DIAGNOSIS — Z170 Estrogen receptor positive status [ER+]: Secondary | ICD-10-CM | POA: Diagnosis not present

## 2016-07-07 DIAGNOSIS — N184 Chronic kidney disease, stage 4 (severe): Secondary | ICD-10-CM | POA: Diagnosis not present

## 2016-07-07 DIAGNOSIS — C50211 Malignant neoplasm of upper-inner quadrant of right female breast: Secondary | ICD-10-CM | POA: Diagnosis not present

## 2016-07-07 DIAGNOSIS — I2510 Atherosclerotic heart disease of native coronary artery without angina pectoris: Secondary | ICD-10-CM | POA: Diagnosis not present

## 2016-07-07 DIAGNOSIS — I129 Hypertensive chronic kidney disease with stage 1 through stage 4 chronic kidney disease, or unspecified chronic kidney disease: Secondary | ICD-10-CM | POA: Diagnosis not present

## 2016-07-07 DIAGNOSIS — D649 Anemia, unspecified: Secondary | ICD-10-CM | POA: Diagnosis not present

## 2016-07-07 DIAGNOSIS — M129 Arthropathy, unspecified: Secondary | ICD-10-CM | POA: Diagnosis not present

## 2016-07-07 DIAGNOSIS — Z510 Encounter for antineoplastic radiation therapy: Secondary | ICD-10-CM | POA: Diagnosis not present

## 2016-07-07 DIAGNOSIS — R0602 Shortness of breath: Secondary | ICD-10-CM | POA: Diagnosis not present

## 2016-07-08 ENCOUNTER — Ambulatory Visit
Admission: RE | Admit: 2016-07-08 | Discharge: 2016-07-08 | Disposition: A | Discharge status: Home / Self Care | Payer: Medicare HMO | Source: Ambulatory Visit | Attending: Radiation Oncology | Admitting: Radiation Oncology

## 2016-07-08 ENCOUNTER — Ambulatory Visit: Payer: Medicare HMO

## 2016-07-08 DIAGNOSIS — N184 Chronic kidney disease, stage 4 (severe): Secondary | ICD-10-CM | POA: Diagnosis not present

## 2016-07-08 DIAGNOSIS — Z510 Encounter for antineoplastic radiation therapy: Secondary | ICD-10-CM | POA: Diagnosis not present

## 2016-07-08 DIAGNOSIS — R0602 Shortness of breath: Secondary | ICD-10-CM | POA: Diagnosis not present

## 2016-07-08 DIAGNOSIS — I2510 Atherosclerotic heart disease of native coronary artery without angina pectoris: Secondary | ICD-10-CM | POA: Diagnosis not present

## 2016-07-08 DIAGNOSIS — C50211 Malignant neoplasm of upper-inner quadrant of right female breast: Secondary | ICD-10-CM | POA: Diagnosis not present

## 2016-07-08 DIAGNOSIS — M129 Arthropathy, unspecified: Secondary | ICD-10-CM | POA: Diagnosis not present

## 2016-07-08 DIAGNOSIS — I129 Hypertensive chronic kidney disease with stage 1 through stage 4 chronic kidney disease, or unspecified chronic kidney disease: Secondary | ICD-10-CM | POA: Diagnosis not present

## 2016-07-08 DIAGNOSIS — D649 Anemia, unspecified: Secondary | ICD-10-CM | POA: Diagnosis not present

## 2016-07-08 DIAGNOSIS — Z170 Estrogen receptor positive status [ER+]: Secondary | ICD-10-CM | POA: Diagnosis not present

## 2016-07-08 NOTE — Progress Notes (Signed)
Phoned patient as 3 month follow-up.  She is doing well with radiation, minimal side effects. She states she ids halfway through her treatment.   Confirmed follow-up appointment with Dr. Grayland Ormond on 07/25/16.  Patient states that may change since she had to reschedule a couple of radiation sessions due to snow.

## 2016-07-11 ENCOUNTER — Ambulatory Visit
Admission: RE | Admit: 2016-07-11 | Discharge: 2016-07-11 | Disposition: A | Discharge status: Home / Self Care | Payer: Medicare HMO | Source: Ambulatory Visit | Attending: Radiation Oncology | Admitting: Radiation Oncology

## 2016-07-11 ENCOUNTER — Ambulatory Visit: Payer: Medicare HMO

## 2016-07-11 DIAGNOSIS — M129 Arthropathy, unspecified: Secondary | ICD-10-CM | POA: Diagnosis not present

## 2016-07-11 DIAGNOSIS — C50211 Malignant neoplasm of upper-inner quadrant of right female breast: Secondary | ICD-10-CM | POA: Diagnosis not present

## 2016-07-11 DIAGNOSIS — D649 Anemia, unspecified: Secondary | ICD-10-CM | POA: Diagnosis not present

## 2016-07-11 DIAGNOSIS — R0602 Shortness of breath: Secondary | ICD-10-CM | POA: Diagnosis not present

## 2016-07-11 DIAGNOSIS — I2510 Atherosclerotic heart disease of native coronary artery without angina pectoris: Secondary | ICD-10-CM | POA: Diagnosis not present

## 2016-07-11 DIAGNOSIS — I129 Hypertensive chronic kidney disease with stage 1 through stage 4 chronic kidney disease, or unspecified chronic kidney disease: Secondary | ICD-10-CM | POA: Diagnosis not present

## 2016-07-11 DIAGNOSIS — N184 Chronic kidney disease, stage 4 (severe): Secondary | ICD-10-CM | POA: Diagnosis not present

## 2016-07-11 DIAGNOSIS — Z510 Encounter for antineoplastic radiation therapy: Secondary | ICD-10-CM | POA: Diagnosis not present

## 2016-07-11 DIAGNOSIS — Z170 Estrogen receptor positive status [ER+]: Secondary | ICD-10-CM | POA: Diagnosis not present

## 2016-07-12 ENCOUNTER — Ambulatory Visit
Admission: RE | Admit: 2016-07-12 | Discharge: 2016-07-12 | Disposition: A | Discharge status: Home / Self Care | Payer: Medicare HMO | Source: Ambulatory Visit | Attending: Radiation Oncology | Admitting: Radiation Oncology

## 2016-07-12 ENCOUNTER — Ambulatory Visit: Payer: Medicare HMO

## 2016-07-12 DIAGNOSIS — C50211 Malignant neoplasm of upper-inner quadrant of right female breast: Secondary | ICD-10-CM | POA: Diagnosis not present

## 2016-07-12 DIAGNOSIS — Z510 Encounter for antineoplastic radiation therapy: Secondary | ICD-10-CM | POA: Diagnosis not present

## 2016-07-12 DIAGNOSIS — I129 Hypertensive chronic kidney disease with stage 1 through stage 4 chronic kidney disease, or unspecified chronic kidney disease: Secondary | ICD-10-CM | POA: Diagnosis not present

## 2016-07-12 DIAGNOSIS — N184 Chronic kidney disease, stage 4 (severe): Secondary | ICD-10-CM | POA: Diagnosis not present

## 2016-07-12 DIAGNOSIS — D649 Anemia, unspecified: Secondary | ICD-10-CM | POA: Diagnosis not present

## 2016-07-12 DIAGNOSIS — R0602 Shortness of breath: Secondary | ICD-10-CM | POA: Diagnosis not present

## 2016-07-12 DIAGNOSIS — I2510 Atherosclerotic heart disease of native coronary artery without angina pectoris: Secondary | ICD-10-CM | POA: Diagnosis not present

## 2016-07-12 DIAGNOSIS — M129 Arthropathy, unspecified: Secondary | ICD-10-CM | POA: Diagnosis not present

## 2016-07-12 DIAGNOSIS — Z170 Estrogen receptor positive status [ER+]: Secondary | ICD-10-CM | POA: Diagnosis not present

## 2016-07-13 ENCOUNTER — Ambulatory Visit: Payer: Medicare HMO

## 2016-07-13 ENCOUNTER — Inpatient Hospital Stay: Payer: Medicare HMO

## 2016-07-13 ENCOUNTER — Ambulatory Visit
Admission: RE | Admit: 2016-07-13 | Discharge: 2016-07-13 | Disposition: A | Discharge status: Home / Self Care | Payer: Medicare HMO | Source: Ambulatory Visit | Attending: Radiation Oncology | Admitting: Radiation Oncology

## 2016-07-13 DIAGNOSIS — N184 Chronic kidney disease, stage 4 (severe): Secondary | ICD-10-CM | POA: Diagnosis not present

## 2016-07-13 DIAGNOSIS — D649 Anemia, unspecified: Secondary | ICD-10-CM | POA: Diagnosis not present

## 2016-07-13 DIAGNOSIS — M129 Arthropathy, unspecified: Secondary | ICD-10-CM | POA: Diagnosis not present

## 2016-07-13 DIAGNOSIS — E538 Deficiency of other specified B group vitamins: Secondary | ICD-10-CM | POA: Diagnosis not present

## 2016-07-13 DIAGNOSIS — Z510 Encounter for antineoplastic radiation therapy: Secondary | ICD-10-CM | POA: Diagnosis not present

## 2016-07-13 DIAGNOSIS — C50211 Malignant neoplasm of upper-inner quadrant of right female breast: Secondary | ICD-10-CM

## 2016-07-13 DIAGNOSIS — Z170 Estrogen receptor positive status [ER+]: Secondary | ICD-10-CM | POA: Diagnosis not present

## 2016-07-13 DIAGNOSIS — R0602 Shortness of breath: Secondary | ICD-10-CM | POA: Diagnosis not present

## 2016-07-13 DIAGNOSIS — I129 Hypertensive chronic kidney disease with stage 1 through stage 4 chronic kidney disease, or unspecified chronic kidney disease: Secondary | ICD-10-CM | POA: Diagnosis not present

## 2016-07-13 DIAGNOSIS — I2510 Atherosclerotic heart disease of native coronary artery without angina pectoris: Secondary | ICD-10-CM | POA: Diagnosis not present

## 2016-07-13 LAB — CBC
HEMATOCRIT: 36.9 % (ref 35.0–47.0)
HEMOGLOBIN: 12.2 g/dL (ref 12.0–16.0)
MCH: 30.8 pg (ref 26.0–34.0)
MCHC: 33.1 g/dL (ref 32.0–36.0)
MCV: 92.9 fL (ref 80.0–100.0)
Platelets: 178 10*3/uL (ref 150–440)
RBC: 3.97 MIL/uL (ref 3.80–5.20)
RDW: 13.6 % (ref 11.5–14.5)
WBC: 6.7 10*3/uL (ref 3.6–11.0)

## 2016-07-14 ENCOUNTER — Ambulatory Visit
Admission: RE | Admit: 2016-07-14 | Discharge: 2016-07-14 | Disposition: A | Discharge status: Home / Self Care | Payer: Medicare HMO | Source: Ambulatory Visit | Attending: Radiation Oncology | Admitting: Radiation Oncology

## 2016-07-14 ENCOUNTER — Ambulatory Visit: Payer: Medicare HMO

## 2016-07-14 DIAGNOSIS — C50211 Malignant neoplasm of upper-inner quadrant of right female breast: Secondary | ICD-10-CM | POA: Diagnosis not present

## 2016-07-14 DIAGNOSIS — M129 Arthropathy, unspecified: Secondary | ICD-10-CM | POA: Diagnosis not present

## 2016-07-14 DIAGNOSIS — N184 Chronic kidney disease, stage 4 (severe): Secondary | ICD-10-CM | POA: Diagnosis not present

## 2016-07-14 DIAGNOSIS — Z170 Estrogen receptor positive status [ER+]: Secondary | ICD-10-CM | POA: Diagnosis not present

## 2016-07-14 DIAGNOSIS — I129 Hypertensive chronic kidney disease with stage 1 through stage 4 chronic kidney disease, or unspecified chronic kidney disease: Secondary | ICD-10-CM | POA: Diagnosis not present

## 2016-07-14 DIAGNOSIS — Z510 Encounter for antineoplastic radiation therapy: Secondary | ICD-10-CM | POA: Diagnosis not present

## 2016-07-14 DIAGNOSIS — I2510 Atherosclerotic heart disease of native coronary artery without angina pectoris: Secondary | ICD-10-CM | POA: Diagnosis not present

## 2016-07-14 DIAGNOSIS — R0602 Shortness of breath: Secondary | ICD-10-CM | POA: Diagnosis not present

## 2016-07-14 DIAGNOSIS — D649 Anemia, unspecified: Secondary | ICD-10-CM | POA: Diagnosis not present

## 2016-07-15 ENCOUNTER — Ambulatory Visit: Payer: Medicare HMO

## 2016-07-15 ENCOUNTER — Ambulatory Visit
Admission: RE | Admit: 2016-07-15 | Discharge: 2016-07-15 | Disposition: A | Discharge status: Home / Self Care | Payer: Medicare HMO | Source: Ambulatory Visit | Attending: Radiation Oncology | Admitting: Radiation Oncology

## 2016-07-15 DIAGNOSIS — I2510 Atherosclerotic heart disease of native coronary artery without angina pectoris: Secondary | ICD-10-CM | POA: Diagnosis not present

## 2016-07-15 DIAGNOSIS — Z170 Estrogen receptor positive status [ER+]: Secondary | ICD-10-CM | POA: Diagnosis not present

## 2016-07-15 DIAGNOSIS — M129 Arthropathy, unspecified: Secondary | ICD-10-CM | POA: Diagnosis not present

## 2016-07-15 DIAGNOSIS — C50211 Malignant neoplasm of upper-inner quadrant of right female breast: Secondary | ICD-10-CM | POA: Diagnosis not present

## 2016-07-15 DIAGNOSIS — Z510 Encounter for antineoplastic radiation therapy: Secondary | ICD-10-CM | POA: Diagnosis not present

## 2016-07-15 DIAGNOSIS — D649 Anemia, unspecified: Secondary | ICD-10-CM | POA: Diagnosis not present

## 2016-07-15 DIAGNOSIS — R0602 Shortness of breath: Secondary | ICD-10-CM | POA: Diagnosis not present

## 2016-07-15 DIAGNOSIS — N184 Chronic kidney disease, stage 4 (severe): Secondary | ICD-10-CM | POA: Diagnosis not present

## 2016-07-15 DIAGNOSIS — I129 Hypertensive chronic kidney disease with stage 1 through stage 4 chronic kidney disease, or unspecified chronic kidney disease: Secondary | ICD-10-CM | POA: Diagnosis not present

## 2016-07-18 ENCOUNTER — Ambulatory Visit: Payer: Medicare HMO

## 2016-07-18 ENCOUNTER — Ambulatory Visit
Admission: RE | Admit: 2016-07-18 | Discharge: 2016-07-18 | Disposition: A | Discharge status: Home / Self Care | Payer: Medicare HMO | Source: Ambulatory Visit | Attending: Radiation Oncology | Admitting: Radiation Oncology

## 2016-07-18 DIAGNOSIS — I10 Essential (primary) hypertension: Secondary | ICD-10-CM | POA: Diagnosis not present

## 2016-07-18 DIAGNOSIS — Z79899 Other long term (current) drug therapy: Secondary | ICD-10-CM | POA: Diagnosis not present

## 2016-07-18 DIAGNOSIS — Z87440 Personal history of urinary (tract) infections: Secondary | ICD-10-CM | POA: Diagnosis not present

## 2016-07-19 ENCOUNTER — Ambulatory Visit: Payer: Medicare HMO

## 2016-07-19 ENCOUNTER — Ambulatory Visit
Admission: RE | Admit: 2016-07-19 | Discharge: 2016-07-19 | Disposition: A | Discharge status: Home / Self Care | Payer: Medicare HMO | Source: Ambulatory Visit | Attending: Radiation Oncology | Admitting: Radiation Oncology

## 2016-07-19 DIAGNOSIS — M129 Arthropathy, unspecified: Secondary | ICD-10-CM | POA: Diagnosis not present

## 2016-07-19 DIAGNOSIS — C50211 Malignant neoplasm of upper-inner quadrant of right female breast: Secondary | ICD-10-CM | POA: Diagnosis not present

## 2016-07-19 DIAGNOSIS — Z510 Encounter for antineoplastic radiation therapy: Secondary | ICD-10-CM | POA: Diagnosis not present

## 2016-07-19 DIAGNOSIS — R0602 Shortness of breath: Secondary | ICD-10-CM | POA: Diagnosis not present

## 2016-07-19 DIAGNOSIS — N184 Chronic kidney disease, stage 4 (severe): Secondary | ICD-10-CM | POA: Diagnosis not present

## 2016-07-19 DIAGNOSIS — D649 Anemia, unspecified: Secondary | ICD-10-CM | POA: Diagnosis not present

## 2016-07-19 DIAGNOSIS — I2510 Atherosclerotic heart disease of native coronary artery without angina pectoris: Secondary | ICD-10-CM | POA: Diagnosis not present

## 2016-07-19 DIAGNOSIS — Z170 Estrogen receptor positive status [ER+]: Secondary | ICD-10-CM | POA: Diagnosis not present

## 2016-07-19 DIAGNOSIS — I129 Hypertensive chronic kidney disease with stage 1 through stage 4 chronic kidney disease, or unspecified chronic kidney disease: Secondary | ICD-10-CM | POA: Diagnosis not present

## 2016-07-20 ENCOUNTER — Ambulatory Visit
Admission: RE | Admit: 2016-07-20 | Discharge: 2016-07-20 | Disposition: A | Discharge status: Home / Self Care | Payer: Medicare HMO | Source: Ambulatory Visit | Attending: Radiation Oncology | Admitting: Radiation Oncology

## 2016-07-20 ENCOUNTER — Ambulatory Visit: Payer: Medicare HMO

## 2016-07-20 DIAGNOSIS — Z510 Encounter for antineoplastic radiation therapy: Secondary | ICD-10-CM | POA: Diagnosis not present

## 2016-07-20 DIAGNOSIS — I129 Hypertensive chronic kidney disease with stage 1 through stage 4 chronic kidney disease, or unspecified chronic kidney disease: Secondary | ICD-10-CM | POA: Diagnosis not present

## 2016-07-20 DIAGNOSIS — Z170 Estrogen receptor positive status [ER+]: Secondary | ICD-10-CM | POA: Diagnosis not present

## 2016-07-20 DIAGNOSIS — M129 Arthropathy, unspecified: Secondary | ICD-10-CM | POA: Diagnosis not present

## 2016-07-20 DIAGNOSIS — D649 Anemia, unspecified: Secondary | ICD-10-CM | POA: Diagnosis not present

## 2016-07-20 DIAGNOSIS — I2510 Atherosclerotic heart disease of native coronary artery without angina pectoris: Secondary | ICD-10-CM | POA: Diagnosis not present

## 2016-07-20 DIAGNOSIS — C50211 Malignant neoplasm of upper-inner quadrant of right female breast: Secondary | ICD-10-CM | POA: Diagnosis not present

## 2016-07-20 DIAGNOSIS — R0602 Shortness of breath: Secondary | ICD-10-CM | POA: Diagnosis not present

## 2016-07-20 DIAGNOSIS — N184 Chronic kidney disease, stage 4 (severe): Secondary | ICD-10-CM | POA: Diagnosis not present

## 2016-07-21 ENCOUNTER — Ambulatory Visit: Payer: Medicare HMO

## 2016-07-21 ENCOUNTER — Ambulatory Visit
Admission: RE | Admit: 2016-07-21 | Discharge: 2016-07-21 | Disposition: A | Discharge status: Home / Self Care | Payer: Medicare HMO | Source: Ambulatory Visit | Attending: Radiation Oncology | Admitting: Radiation Oncology

## 2016-07-21 DIAGNOSIS — I129 Hypertensive chronic kidney disease with stage 1 through stage 4 chronic kidney disease, or unspecified chronic kidney disease: Secondary | ICD-10-CM | POA: Diagnosis not present

## 2016-07-21 DIAGNOSIS — Z510 Encounter for antineoplastic radiation therapy: Secondary | ICD-10-CM | POA: Diagnosis not present

## 2016-07-21 DIAGNOSIS — R0602 Shortness of breath: Secondary | ICD-10-CM | POA: Diagnosis not present

## 2016-07-21 DIAGNOSIS — C50211 Malignant neoplasm of upper-inner quadrant of right female breast: Secondary | ICD-10-CM | POA: Diagnosis not present

## 2016-07-21 DIAGNOSIS — I2510 Atherosclerotic heart disease of native coronary artery without angina pectoris: Secondary | ICD-10-CM | POA: Diagnosis not present

## 2016-07-21 DIAGNOSIS — Z170 Estrogen receptor positive status [ER+]: Secondary | ICD-10-CM | POA: Diagnosis not present

## 2016-07-21 DIAGNOSIS — N184 Chronic kidney disease, stage 4 (severe): Secondary | ICD-10-CM | POA: Diagnosis not present

## 2016-07-21 DIAGNOSIS — M129 Arthropathy, unspecified: Secondary | ICD-10-CM | POA: Diagnosis not present

## 2016-07-21 DIAGNOSIS — D649 Anemia, unspecified: Secondary | ICD-10-CM | POA: Diagnosis not present

## 2016-07-22 ENCOUNTER — Ambulatory Visit: Payer: Medicare HMO

## 2016-07-22 ENCOUNTER — Ambulatory Visit
Admission: RE | Admit: 2016-07-22 | Discharge: 2016-07-22 | Disposition: A | Discharge status: Home / Self Care | Payer: Medicare HMO | Source: Ambulatory Visit | Attending: Radiation Oncology | Admitting: Radiation Oncology

## 2016-07-22 DIAGNOSIS — C50211 Malignant neoplasm of upper-inner quadrant of right female breast: Secondary | ICD-10-CM | POA: Diagnosis not present

## 2016-07-22 DIAGNOSIS — Z170 Estrogen receptor positive status [ER+]: Secondary | ICD-10-CM | POA: Diagnosis not present

## 2016-07-22 DIAGNOSIS — N184 Chronic kidney disease, stage 4 (severe): Secondary | ICD-10-CM | POA: Diagnosis not present

## 2016-07-22 DIAGNOSIS — R0602 Shortness of breath: Secondary | ICD-10-CM | POA: Diagnosis not present

## 2016-07-22 DIAGNOSIS — Z510 Encounter for antineoplastic radiation therapy: Secondary | ICD-10-CM | POA: Diagnosis not present

## 2016-07-22 DIAGNOSIS — D649 Anemia, unspecified: Secondary | ICD-10-CM | POA: Diagnosis not present

## 2016-07-22 DIAGNOSIS — I2510 Atherosclerotic heart disease of native coronary artery without angina pectoris: Secondary | ICD-10-CM | POA: Diagnosis not present

## 2016-07-22 DIAGNOSIS — M129 Arthropathy, unspecified: Secondary | ICD-10-CM | POA: Diagnosis not present

## 2016-07-22 DIAGNOSIS — I129 Hypertensive chronic kidney disease with stage 1 through stage 4 chronic kidney disease, or unspecified chronic kidney disease: Secondary | ICD-10-CM | POA: Diagnosis not present

## 2016-07-25 ENCOUNTER — Ambulatory Visit: Payer: Medicare HMO

## 2016-07-25 ENCOUNTER — Ambulatory Visit: Payer: Medicare HMO | Admitting: Oncology

## 2016-07-26 ENCOUNTER — Ambulatory Visit: Payer: Medicare HMO

## 2016-07-27 ENCOUNTER — Ambulatory Visit: Payer: Medicare HMO

## 2016-07-27 DIAGNOSIS — N2581 Secondary hyperparathyroidism of renal origin: Secondary | ICD-10-CM | POA: Diagnosis not present

## 2016-07-27 DIAGNOSIS — D631 Anemia in chronic kidney disease: Secondary | ICD-10-CM | POA: Diagnosis not present

## 2016-07-27 DIAGNOSIS — N184 Chronic kidney disease, stage 4 (severe): Secondary | ICD-10-CM | POA: Diagnosis not present

## 2016-07-27 DIAGNOSIS — E559 Vitamin D deficiency, unspecified: Secondary | ICD-10-CM | POA: Diagnosis not present

## 2016-07-27 DIAGNOSIS — N390 Urinary tract infection, site not specified: Secondary | ICD-10-CM | POA: Diagnosis not present

## 2016-07-27 DIAGNOSIS — I129 Hypertensive chronic kidney disease with stage 1 through stage 4 chronic kidney disease, or unspecified chronic kidney disease: Secondary | ICD-10-CM | POA: Diagnosis not present

## 2016-07-27 NOTE — Progress Notes (Signed)
[place]  [phone] [phone]  ID: Rachel Jones OB: [birth year]  MR#: [id number]  [facility]#:[id number]  Patient Care Team: Idelle Crouch, MD as PCP - General (Internal Medicine)  CHIEF COMPLAINT: Pathologic stage IA ER positive, PR, HER-2 negative invasive carcinoma of the upper inner quadrant of right breast.  INTERVAL HISTORY: Patient returns to clinic today for further evaluation and initiation of an aromatase inhibitor. She has now completed her adjuvant XRT and tolerated it well without significant side effects. She continues to feel well and is asymptomatic. She has no neurologic complaints. She denies any recent fevers or illnesses. She has a good appetite and denies weight loss. She has no chest pain or shortness of breath. She denies any nausea, vomiting, constipation, or diarrhea. She has no urinary complaints. Patient offers no specific complaints today.  REVIEW OF SYSTEMS:   Review of Systems  Constitutional: Negative.  Negative for fever, malaise/fatigue and weight loss.  Respiratory: Negative.  Negative for cough.   Cardiovascular: Negative.  Negative for chest pain and leg swelling.  Gastrointestinal: Negative.  Negative for abdominal pain.  Genitourinary: Negative.   Musculoskeletal: Negative.   Neurological: Negative for sensory change and weakness.  Psychiatric/Behavioral: Negative.  The patient is not nervous/anxious.     As per HPI. Otherwise, a complete review of systems is negative.  PAST MEDICAL HISTORY: Past Medical History:  Diagnosis Date  . Anemia    is followed by renal doctor and pcp  . Arthritis   . Cancer [facility]) 1961   uterine cancer  . Coronary artery disease   . Dyspnea   . GERD (gastroesophageal reflux disease)   . Hypertension   . Kidney disease    stage 4.  followed by dr. Sedonia Small  . Lower extremity edema   . S/P lumpectomy, right breast    having done on 04/26/16  . Sleep apnea    uses cpap     PAST SURGICAL HISTORY: Past Surgical History:  Procedure Laterality Date  . BREAST LUMPECTOMY Right    04/26/16  . CAROTID STENT Left 2016   was having pain down neck. carotid artery clogged. dr. Delana Meyer put stent in  . CAROTID STENT Left   . CATARACT EXTRACTION W/ INTRAOCULAR LENS  IMPLANT, BILATERAL Bilateral 2009  . EYE SURGERY  2009   cataracts  . PARTIAL HYSTERECTOMY  1961   uterus only removed  . PARTIAL MASTECTOMY WITH NEEDLE LOCALIZATION Right 04/26/2016   Procedure: PARTIAL MASTECTOMY WITH NEEDLE LOCALIZATION;  Surgeon: Leonie Green, MD;  Location: [facility] ORS;  Service: General;  Laterality: Right;  . SENTINEL NODE BIOPSY Right 04/26/2016   Procedure: SENTINEL NODE BIOPSY;  Surgeon: Leonie Green, MD;  Location: [facility] ORS;  Service: General;  Laterality: Right;    FAMILY HISTORY: No family history on file.  ADVANCED DIRECTIVES (Y/N):  N  HEALTH MAINTENANCE: Social History  Substance Use Topics  . Smoking status: Never Smoker  . Smokeless tobacco: Never Used  . Alcohol use No     Colonoscopy:  PAP:  Bone density:  Lipid panel:  Allergies  Allergen Reactions  . Codeine Nausea Only    Intolerance instead of true allerg  . Sulfa Antibiotics Swelling and Rash    swelling    Current Outpatient Prescriptions  Medication Sig Dispense Refill  . acetaminophen (TYLENOL) 500 MG tablet Take 1,000 mg by mouth daily. May take an additional 1000 mgs as needed for pain    . aspirin EC 81  MG tablet Take 81 mg by mouth at bedtime.     . cefUROXime (CEFTIN) 500 MG tablet Take 1 tablet by mouth 2 (two) times daily.    . Cholecalciferol (VITAMIN D3) 1000 units CAPS Take 1,000 Units by mouth daily.     . clopidogrel (PLAVIX) 75 MG tablet TAKE 1 TABLET EVERY DAY 90 tablet 3  . Cyanocobalamin (VITAMIN B-12 IJ) Inject 1,000 mcg as directed every 30 (thirty) days.    Marland Kitchen esomeprazole (NEXIUM) 40 MG capsule Take 40 mg by mouth daily.    . ferrous gluconate (FERGON) 324  MG tablet Take 324 mg by mouth daily.    . furosemide (LASIX) 20 MG tablet Take 20 mg by mouth daily as needed for edema.     Marland Kitchen HYDROcodone-acetaminophen (NORCO) 5-325 MG tablet Take 1-2 tablets by mouth every 4 (four) hours as needed for moderate pain. 12 tablet 0  . losartan (COZAAR) 50 MG tablet Take 50 mg by mouth daily.    Marland Kitchen lovastatin (MEVACOR) 40 MG tablet Take 40 mg by mouth 2 (two) times daily.    . metoprolol tartrate (LOPRESSOR) 25 MG tablet Take 25 mg by mouth 2 (two) times daily.    . ondansetron (ZOFRAN) 4 MG/2ML SOLN injection Inject 2 mLs (4 mg total) into the vein once as needed for nausea. 2 mL 0  . silver sulfADIAZINE (SILVADENE) 1 % cream Apply 1 application topically 2 (two) times daily. 50 g 0  . letrozole (FEMARA) 2.5 MG tablet Take 1 tablet (2.5 mg total) by mouth daily. 90 tablet 3   No current facility-administered medications for this visit.     OBJECTIVE: Vitals:   07/28/16 0950  BP: (!) 146/84  Pulse: 69  Resp: 18  Temp: 97.2 F (36.2 C)     Body mass index is 36.25 kg/m.    ECOG FS:0 - Asymptomatic  General: Well-developed, well-nourished, no acute distress. Eyes: Pink conjunctiva, anicteric sclera. Breasts: Well-healed surgical scar on right breast. Exam deferred today. Lungs: Clear to auscultation bilaterally. Heart: Regular rate and rhythm. No rubs, murmurs, or gallops. Abdomen: Soft, nontender, nondistended. No organomegaly noted, normoactive bowel sounds. Musculoskeletal: No edema, cyanosis, or clubbing. Neuro: Alert, answering all questions appropriately. Cranial nerves grossly intact. Skin: No rashes or petechiae noted. Psych: Normal affect.   LAB RESULTS:  Lab Results  Component Value Date   NA 138 07/28/2016   K 4.8 07/28/2016   CL 106 07/28/2016   CO2 22 07/28/2016   GLUCOSE 96 07/28/2016   BUN 39 (H) 07/28/2016   CREATININE 1.74 (H) 07/28/2016   CALCIUM 8.9 07/28/2016   GFRNONAA 27 (L) 07/28/2016   GFRAA 31 (L) 07/28/2016     Lab Results  Component Value Date   WBC 6.7 07/13/2016   NEUTROABS 5.0 05/28/2014   HGB 12.2 07/13/2016   HCT 36.9 07/13/2016   MCV 92.9 07/13/2016   PLT 178 07/13/2016     STUDIES: No results found.  ASSESSMENT: Pathologic stage IA ER positive, PR HER-2 negative invasive carcinoma of the upper inner quadrant of right breast. High-risk Mammoprint.  PLAN:    1. Pathologic stage IA ER positive, PR HER-2 negative invasive carcinoma of the upper inner quadrant of right breast: Final pathology is reviewed independently. Although patient returned high risk Mammoprint, it was determined given her advanced age as well as multiple comorbidities that chemotherapy was not in her best interest. She has now completed adjuvant XRT. Patient was given a prescription for letrozole which she will  be required to take for total 5 years completing in March 2023. Return to clinic in 3 months for further evaluation. 2. Postmenopausal: We will get a baseline bone mineral density in the next 1-2 weeks.   Approximately 30 minutes was spent in discussion of which greater than 50% was consultation.  Patient expressed understanding and was in agreement with this plan. She also understands that She can call clinic at any time with any questions, concerns, or complaints.   Cancer Staging Primary cancer of upper inner quadrant of right female breast [facility]) Staging form: Breast, AJCC 7th Edition - Pathologic stage from 05/15/2016: Stage IA (T1c, N0, cM0) - Signed by Lloyd Huger, MD on 05/15/2016   Lloyd Huger, MD   07/31/2016 11:16 AM

## 2016-07-28 ENCOUNTER — Inpatient Hospital Stay: Payer: Medicare HMO

## 2016-07-28 ENCOUNTER — Inpatient Hospital Stay: Payer: Medicare HMO | Attending: Oncology | Admitting: Oncology

## 2016-07-28 VITALS — BP 146/84 | HR 69 | Temp 97.2°F | Resp 18 | Wt 198.2 lb

## 2016-07-28 DIAGNOSIS — Z79811 Long term (current) use of aromatase inhibitors: Secondary | ICD-10-CM | POA: Diagnosis not present

## 2016-07-28 DIAGNOSIS — Z79899 Other long term (current) drug therapy: Secondary | ICD-10-CM | POA: Diagnosis not present

## 2016-07-28 DIAGNOSIS — I2510 Atherosclerotic heart disease of native coronary artery without angina pectoris: Secondary | ICD-10-CM | POA: Diagnosis not present

## 2016-07-28 DIAGNOSIS — Z923 Personal history of irradiation: Secondary | ICD-10-CM | POA: Diagnosis not present

## 2016-07-28 DIAGNOSIS — Z8542 Personal history of malignant neoplasm of other parts of uterus: Secondary | ICD-10-CM

## 2016-07-28 DIAGNOSIS — Z7982 Long term (current) use of aspirin: Secondary | ICD-10-CM

## 2016-07-28 DIAGNOSIS — Z170 Estrogen receptor positive status [ER+]: Secondary | ICD-10-CM

## 2016-07-28 DIAGNOSIS — N184 Chronic kidney disease, stage 4 (severe): Secondary | ICD-10-CM

## 2016-07-28 DIAGNOSIS — Z9011 Acquired absence of right breast and nipple: Secondary | ICD-10-CM | POA: Diagnosis not present

## 2016-07-28 DIAGNOSIS — C50211 Malignant neoplasm of upper-inner quadrant of right female breast: Secondary | ICD-10-CM | POA: Diagnosis not present

## 2016-07-28 DIAGNOSIS — G4730 Sleep apnea, unspecified: Secondary | ICD-10-CM | POA: Diagnosis not present

## 2016-07-28 DIAGNOSIS — I129 Hypertensive chronic kidney disease with stage 1 through stage 4 chronic kidney disease, or unspecified chronic kidney disease: Secondary | ICD-10-CM

## 2016-07-28 DIAGNOSIS — D649 Anemia, unspecified: Secondary | ICD-10-CM | POA: Diagnosis not present

## 2016-07-28 DIAGNOSIS — R600 Localized edema: Secondary | ICD-10-CM

## 2016-07-28 LAB — BASIC METABOLIC PANEL
Anion gap: 10 (ref 5–15)
BUN: 39 mg/dL — AB (ref 6–20)
CO2: 22 mmol/L (ref 22–32)
CREATININE: 1.74 mg/dL — AB (ref 0.44–1.00)
Calcium: 8.9 mg/dL (ref 8.9–10.3)
Chloride: 106 mmol/L (ref 101–111)
GFR calc Af Amer: 31 mL/min — ABNORMAL LOW (ref >60)
GFR, EST NON AFRICAN AMERICAN: 27 mL/min — AB (ref >60)
Glucose, Bld: 96 mg/dL (ref 65–99)
POTASSIUM: 4.8 mmol/L (ref 3.5–5.1)
SODIUM: 138 mmol/L (ref 135–145)

## 2016-07-28 MED ORDER — LETROZOLE 2.5 MG PO TABS: 2.5000 mg | ORAL_TABLET | Freq: Every day | ORAL | 3 refills | Status: DC

## 2016-07-28 NOTE — Progress Notes (Signed)
Offers no complaints. States is feeling well. Finished radiation treatments recently.

## 2016-08-04 DIAGNOSIS — E538 Deficiency of other specified B group vitamins: Secondary | ICD-10-CM | POA: Diagnosis not present

## 2016-08-08 DIAGNOSIS — Z853 Personal history of malignant neoplasm of breast: Secondary | ICD-10-CM | POA: Diagnosis not present

## 2016-08-11 ENCOUNTER — Ambulatory Visit
Admission: RE | Admit: 2016-08-11 | Discharge: 2016-08-11 | Disposition: A | Discharge status: Home / Self Care | Payer: Medicare HMO | Source: Ambulatory Visit | Attending: Oncology | Admitting: Oncology

## 2016-08-11 DIAGNOSIS — C50211 Malignant neoplasm of upper-inner quadrant of right female breast: Secondary | ICD-10-CM | POA: Diagnosis not present

## 2016-08-11 DIAGNOSIS — M810 Age-related osteoporosis without current pathological fracture: Secondary | ICD-10-CM | POA: Insufficient documentation

## 2016-08-11 DIAGNOSIS — Z780 Asymptomatic menopausal state: Secondary | ICD-10-CM | POA: Diagnosis not present

## 2016-08-24 ENCOUNTER — Ambulatory Visit
Admission: RE | Admit: 2016-08-24 | Discharge: 2016-08-24 | Disposition: A | Discharge status: Home / Self Care | Payer: Medicare HMO | Source: Ambulatory Visit | Attending: Radiation Oncology | Admitting: Radiation Oncology

## 2016-08-24 ENCOUNTER — Encounter: Payer: Self-pay | Admitting: Radiation Oncology

## 2016-08-24 VITALS — BP 132/60 | HR 67 | Temp 97.0°F | Wt 199.7 lb

## 2016-08-24 DIAGNOSIS — Z170 Estrogen receptor positive status [ER+]: Secondary | ICD-10-CM | POA: Insufficient documentation

## 2016-08-24 DIAGNOSIS — Z79811 Long term (current) use of aromatase inhibitors: Secondary | ICD-10-CM | POA: Diagnosis not present

## 2016-08-24 DIAGNOSIS — Z923 Personal history of irradiation: Secondary | ICD-10-CM | POA: Insufficient documentation

## 2016-08-24 DIAGNOSIS — C50211 Malignant neoplasm of upper-inner quadrant of right female breast: Secondary | ICD-10-CM | POA: Insufficient documentation

## 2016-08-24 NOTE — Progress Notes (Signed)
Radiation Oncology Follow up Note  Name: Rachel Jones   Date:   08/24/2016 MRN:  [id number] DOB: 1934/02/27    This 83 y.o. female presents to the clinic today for one-month follow-up status post whole breast radiation to her right breast for ER/PR positive. Stage I invasive mammary carcinoma  REFERRING PROVIDER: Idelle Crouch, MD  HPI: Patient is an 83 year old female now out 1 month having completed whole breast radiation to her right breast for stage I ER/PR positive invasive mammary carcinoma. Seen today in routine follow-up she is doing well. She specifically denies breast tenderness cough or bone pain.. She has started Femara tolerating that well without side effect.  COMPLICATIONS OF TREATMENT: none  FOLLOW UP COMPLIANCE: keeps appointments   PHYSICAL EXAM:  BP 132/60   Pulse 67   Temp 97 F (36.1 C)   Wt 199 lb 11.8 oz (90.6 kg)   BMI 36.53 kg/m  Lungs are clear to A&P cardiac examination essentially unremarkable with regular rate and rhythm. No dominant mass or nodularity is noted in either breast in 2 positions examined. Incision is well-healed. No axillary or supraclavicular adenopathy is appreciated. Cosmetic result is excellent. Well-developed well-nourished patient in NAD. HEENT reveals PERLA, EOMI, discs not visualized.  Oral cavity is clear. No oral mucosal lesions are identified. Neck is clear without evidence of cervical or supraclavicular adenopathy. Lungs are clear to A&P. Cardiac examination is essentially unremarkable with regular rate and rhythm without murmur rub or thrill. Abdomen is benign with no organomegaly or masses noted. Motor sensory and DTR levels are equal and symmetric in the upper and lower extremities. Cranial nerves II through XII are grossly intact. Proprioception is intact. No peripheral adenopathy or edema is identified. No motor or sensory levels are noted. Crude visual fields are within normal range.  RADIOLOGY RESULTS: No current films for  review  PLAN: Present time patient is one month out doing well status post whole breast radiation. I'm please were overall progress. She's currently on Femara tolerating that well without side effect. Please with her overall progress. We'll see her back in 4-5 months for follow-up. Patient family know to call with any concerns.  I would like to take this opportunity to thank you for allowing me to participate in the care of your patient.Armstead Peaks., MD

## 2016-08-29 DIAGNOSIS — I6522 Occlusion and stenosis of left carotid artery: Secondary | ICD-10-CM | POA: Diagnosis not present

## 2016-08-29 DIAGNOSIS — E782 Mixed hyperlipidemia: Secondary | ICD-10-CM | POA: Diagnosis not present

## 2016-08-29 DIAGNOSIS — I779 Disorder of arteries and arterioles, unspecified: Secondary | ICD-10-CM | POA: Diagnosis not present

## 2016-08-29 DIAGNOSIS — I10 Essential (primary) hypertension: Secondary | ICD-10-CM | POA: Diagnosis not present

## 2016-08-30 ENCOUNTER — Telehealth: Payer: Self-pay | Admitting: *Deleted

## 2016-08-30 NOTE — Telephone Encounter (Signed)
The BMD measured at Femur Neck Left is 0.689 g/cm2 with a T-score of -2.5. This patient is considered osteoporotic according to [facility]) criteria. L2 was excluded due to degenerative changes. Patient is not a candidate for FRAX assessment due to osteoporosis report. She is not on calcium, she does take Vit D 3000 units daily, she is not on bisphosphonate either. Please advise.

## 2016-08-31 NOTE — Telephone Encounter (Signed)
Calcium, vit D and Weekly 70mg  fosamax please.

## 2016-09-01 MED ORDER — ALENDRONATE SODIUM 70 MG PO TABS: 70.0000 mg | ORAL_TABLET | ORAL | 3 refills | Status: DC

## 2016-09-01 MED ORDER — CALCIUM CARBONATE-VITAMIN D 500-200 MG-UNIT PO TABS: 1.0000 | ORAL_TABLET | Freq: Two times a day (BID) | ORAL | 3 refills | Status: DC

## 2016-09-01 NOTE — Telephone Encounter (Signed)
Patient informed of new rx and requested it be sent to [facility] mail order. Sent

## 2016-09-09 DIAGNOSIS — E538 Deficiency of other specified B group vitamins: Secondary | ICD-10-CM | POA: Diagnosis not present

## 2016-09-26 DIAGNOSIS — E782 Mixed hyperlipidemia: Secondary | ICD-10-CM | POA: Diagnosis not present

## 2016-09-26 DIAGNOSIS — I10 Essential (primary) hypertension: Secondary | ICD-10-CM | POA: Diagnosis not present

## 2016-09-26 DIAGNOSIS — R609 Edema, unspecified: Secondary | ICD-10-CM | POA: Diagnosis not present

## 2016-09-26 DIAGNOSIS — R0609 Other forms of dyspnea: Secondary | ICD-10-CM | POA: Diagnosis not present

## 2016-09-26 DIAGNOSIS — R0600 Dyspnea, unspecified: Secondary | ICD-10-CM | POA: Diagnosis not present

## 2016-09-26 DIAGNOSIS — Z87440 Personal history of urinary (tract) infections: Secondary | ICD-10-CM | POA: Diagnosis not present

## 2016-09-26 DIAGNOSIS — Z1329 Encounter for screening for other suspected endocrine disorder: Secondary | ICD-10-CM | POA: Diagnosis not present

## 2016-09-26 DIAGNOSIS — Z79899 Other long term (current) drug therapy: Secondary | ICD-10-CM | POA: Diagnosis not present

## 2016-09-26 DIAGNOSIS — N184 Chronic kidney disease, stage 4 (severe): Secondary | ICD-10-CM | POA: Diagnosis not present

## 2016-09-26 DIAGNOSIS — R8290 Unspecified abnormal findings in urine: Secondary | ICD-10-CM | POA: Diagnosis not present

## 2016-10-11 DIAGNOSIS — E538 Deficiency of other specified B group vitamins: Secondary | ICD-10-CM | POA: Diagnosis not present

## 2016-10-27 ENCOUNTER — Ambulatory Visit: Payer: Self-pay | Admitting: Urology

## 2016-11-03 ENCOUNTER — Ambulatory Visit: Payer: Medicare HMO | Admitting: Oncology

## 2016-11-11 DIAGNOSIS — E538 Deficiency of other specified B group vitamins: Secondary | ICD-10-CM | POA: Diagnosis not present

## 2016-11-15 ENCOUNTER — Inpatient Hospital Stay: Payer: Medicare HMO | Attending: Oncology | Admitting: Oncology

## 2016-11-15 VITALS — BP 161/82 | HR 69 | Temp 97.0°F | Resp 18 | Wt 196.0 lb

## 2016-11-15 DIAGNOSIS — K219 Gastro-esophageal reflux disease without esophagitis: Secondary | ICD-10-CM | POA: Insufficient documentation

## 2016-11-15 DIAGNOSIS — M129 Arthropathy, unspecified: Secondary | ICD-10-CM | POA: Insufficient documentation

## 2016-11-15 DIAGNOSIS — Z8542 Personal history of malignant neoplasm of other parts of uterus: Secondary | ICD-10-CM | POA: Diagnosis not present

## 2016-11-15 DIAGNOSIS — Z79899 Other long term (current) drug therapy: Secondary | ICD-10-CM | POA: Diagnosis not present

## 2016-11-15 DIAGNOSIS — Z7982 Long term (current) use of aspirin: Secondary | ICD-10-CM | POA: Insufficient documentation

## 2016-11-15 DIAGNOSIS — C50211 Malignant neoplasm of upper-inner quadrant of right female breast: Secondary | ICD-10-CM | POA: Insufficient documentation

## 2016-11-15 DIAGNOSIS — Z170 Estrogen receptor positive status [ER+]: Secondary | ICD-10-CM | POA: Diagnosis not present

## 2016-11-15 DIAGNOSIS — I129 Hypertensive chronic kidney disease with stage 1 through stage 4 chronic kidney disease, or unspecified chronic kidney disease: Secondary | ICD-10-CM | POA: Diagnosis not present

## 2016-11-15 DIAGNOSIS — N184 Chronic kidney disease, stage 4 (severe): Secondary | ICD-10-CM | POA: Diagnosis not present

## 2016-11-15 DIAGNOSIS — R0602 Shortness of breath: Secondary | ICD-10-CM | POA: Diagnosis not present

## 2016-11-15 DIAGNOSIS — R609 Edema, unspecified: Secondary | ICD-10-CM | POA: Diagnosis not present

## 2016-11-15 DIAGNOSIS — I2510 Atherosclerotic heart disease of native coronary artery without angina pectoris: Secondary | ICD-10-CM | POA: Diagnosis not present

## 2016-11-15 DIAGNOSIS — G4730 Sleep apnea, unspecified: Secondary | ICD-10-CM | POA: Diagnosis not present

## 2016-11-15 DIAGNOSIS — D649 Anemia, unspecified: Secondary | ICD-10-CM | POA: Insufficient documentation

## 2016-11-15 DIAGNOSIS — Z79811 Long term (current) use of aromatase inhibitors: Secondary | ICD-10-CM

## 2016-11-15 NOTE — Progress Notes (Signed)
Patient does not offer any problems today.  

## 2016-11-15 NOTE — Progress Notes (Signed)
[place]  [phone] [phone]  ID: Eudelia Bunch OB: 1933/08/24  MR#: [id number]  BMW#:[id number]  Patient Care Team: Idelle Crouch, MD as PCP - General (Internal Medicine)  CHIEF COMPLAINT: Pathologic stage IA ER positive, PR, HER-2 negative invasive carcinoma of the upper inner quadrant of right breast.  INTERVAL HISTORY: Patient returns to clinic today for further evaluation. She is tolerating letrozole well without significant side effects. She continues to feel well and is asymptomatic. She has no neurologic complaints. She denies any recent fevers or illnesses. She has a good appetite and denies weight loss. She has no chest pain or shortness of breath. She denies any nausea, vomiting, constipation, or diarrhea. She has had several UTI's over the past year and is seeing a urologist. Patient offers no further specific complaints today.  REVIEW OF SYSTEMS:   Review of Systems  Constitutional: Negative.  Negative for fever, malaise/fatigue and weight loss.  HENT: Negative.   Eyes: Negative.   Respiratory: Negative.  Negative for cough.   Cardiovascular: Negative.  Negative for chest pain and leg swelling.  Gastrointestinal: Negative.  Negative for abdominal pain.  Genitourinary: Positive for urgency.  Musculoskeletal: Negative.   Skin: Negative.   Neurological: Negative for sensory change and weakness.  Endo/Heme/Allergies: Negative.   Psychiatric/Behavioral: Negative.  The patient is not nervous/anxious.     As per HPI. Otherwise, a complete review of systems is negative.  PAST MEDICAL HISTORY: Past Medical History:  Diagnosis Date  . Anemia    is followed by renal doctor and pcp  . Arthritis   . Cancer [facility]) 1961   uterine cancer  . Coronary artery disease   . Dyspnea   . GERD (gastroesophageal reflux disease)   . Hypertension   . Kidney disease    stage 4.  followed by dr. Sedonia Small  . Lower extremity edema   . S/P  lumpectomy, right breast    having done on 04/26/16  . Sleep apnea    uses cpap    PAST SURGICAL HISTORY: Past Surgical History:  Procedure Laterality Date  . BREAST LUMPECTOMY Right    04/26/16  . CAROTID STENT Left 2016   was having pain down neck. carotid artery clogged. dr. Delana Meyer put stent in  . CAROTID STENT Left   . CATARACT EXTRACTION W/ INTRAOCULAR LENS  IMPLANT, BILATERAL Bilateral 2009  . EYE SURGERY  2009   cataracts  . PARTIAL HYSTERECTOMY  1961   uterus only removed  . PARTIAL MASTECTOMY WITH NEEDLE LOCALIZATION Right 04/26/2016   Procedure: PARTIAL MASTECTOMY WITH NEEDLE LOCALIZATION;  Surgeon: Leonie Green, MD;  Location: [facility] ORS;  Service: General;  Laterality: Right;  . SENTINEL NODE BIOPSY Right 04/26/2016   Procedure: SENTINEL NODE BIOPSY;  Surgeon: Leonie Green, MD;  Location: [facility] ORS;  Service: General;  Laterality: Right;    FAMILY HISTORY: Family History  Problem Relation Age of Onset  . Bladder Cancer Neg Hx   . Kidney cancer Neg Hx   . Prolactinoma Neg Hx   . Prostate cancer Neg Hx     ADVANCED DIRECTIVES (Y/N):  N  HEALTH MAINTENANCE: Social History  Substance Use Topics  . Smoking status: Never Smoker  . Smokeless tobacco: Never Used  . Alcohol use No     Colonoscopy:  PAP:  Bone density:  Lipid panel:  Allergies  Allergen Reactions  . Codeine Nausea Only    Intolerance instead of true allerg  . Sulfa Antibiotics Swelling  and Rash    swelling    Current Outpatient Prescriptions  Medication Sig Dispense Refill  . acetaminophen (TYLENOL) 500 MG tablet Take 1,000 mg by mouth daily. May take an additional 1000 mgs as needed for pain    . alendronate (FOSAMAX) 70 MG tablet Take 1 tablet (70 mg total) by mouth once a week. Take with a full glass of water on an empty stomach. 12 tablet 3  . aspirin EC 81 MG tablet Take 81 mg by mouth at bedtime.     . calcium-vitamin D (OSCAL WITH D) 500-200 MG-UNIT tablet Take 1 tablet  by mouth 2 (two) times daily. 180 tablet 3  . Cholecalciferol (VITAMIN D3) 1000 units CAPS Take 1,000 Units by mouth daily.     . clopidogrel (PLAVIX) 75 MG tablet TAKE 1 TABLET EVERY DAY 90 tablet 3  . Cyanocobalamin (VITAMIN B-12 IJ) Inject 1,000 mcg as directed every 30 (thirty) days.    Marland Kitchen esomeprazole (NEXIUM) 40 MG capsule Take 40 mg by mouth daily.    . ferrous gluconate (FERGON) 324 MG tablet Take 324 mg by mouth daily.    . furosemide (LASIX) 20 MG tablet Take 20 mg by mouth daily as needed for edema.     Marland Kitchen letrozole (FEMARA) 2.5 MG tablet Take 1 tablet (2.5 mg total) by mouth daily. 90 tablet 3  . losartan (COZAAR) 50 MG tablet Take 50 mg by mouth daily.    Marland Kitchen lovastatin (MEVACOR) 40 MG tablet Take 40 mg by mouth 2 (two) times daily.    . metoprolol tartrate (LOPRESSOR) 25 MG tablet Take 25 mg by mouth 2 (two) times daily.    . silver sulfADIAZINE (SILVADENE) 1 % cream Apply 1 application topically 2 (two) times daily. 50 g 0   No current facility-administered medications for this visit.     OBJECTIVE: Vitals:   11/15/16 1538  BP: (!) 161/82  Pulse: 69  Resp: 18  Temp: 97 F (36.1 C)     Body mass index is 35.85 kg/m.    ECOG FS:0 - Asymptomatic  General: Well-developed, well-nourished, no acute distress. Eyes: Pink conjunctiva, anicteric sclera. Breasts: Well-healed surgical scar on right breast. Exam deferred today. Lungs: Clear to auscultation bilaterally. Heart: Regular rate and rhythm. No rubs, murmurs, or gallops. Abdomen: Soft, nontender, nondistended. No organomegaly noted, normoactive bowel sounds. Musculoskeletal: No edema, cyanosis, or clubbing. Neuro: Alert, answering all questions appropriately. Cranial nerves grossly intact. Skin: No rashes or petechiae noted. Psych: Normal affect.   LAB RESULTS:  Lab Results  Component Value Date   NA 138 07/28/2016   K 4.8 07/28/2016   CL 106 07/28/2016   CO2 22 07/28/2016   GLUCOSE 96 07/28/2016   BUN 39 (H)  07/28/2016   CREATININE 1.74 (H) 07/28/2016   CALCIUM 8.9 07/28/2016   GFRNONAA 27 (L) 07/28/2016   GFRAA 31 (L) 07/28/2016    Lab Results  Component Value Date   WBC 6.7 07/13/2016   NEUTROABS 5.0 05/28/2014   HGB 12.2 07/13/2016   HCT 36.9 07/13/2016   MCV 92.9 07/13/2016   PLT 178 07/13/2016     STUDIES: No results found.  ASSESSMENT: Pathologic stage IA ER positive, PR HER-2 negative invasive carcinoma of the upper inner quadrant of right breast. High-risk Mammoprint.  PLAN:    1. Pathologic stage IA ER positive, PR HER-2 negative invasive carcinoma of the upper inner quadrant of right breast. Although patient returned high risk Mammoprint, it was determined given her advanced age  as well as multiple comorbidities that chemotherapy was not in her best interest. She has now completed adjuvant XRT. Patient continues to take letrozole which she will be required to take for total 5 years completing in March 2023. She has had no issues with this medication. Return to clinic in 6 months for further evaluation. Mammogram due in October 2018.  2. Postmenopausal: Bone density scan -2.5. Encouraged calcium and Vitamin D. Continue taking Fosamax.  3. Hypertension: Patient's blood pressure mildly elevated today, monitor.  Approximately 20 minutes was spent in discussion of which greater than 50% was consultation.  Patient expressed understanding and was in agreement with this plan. She also understands that She can call clinic at any time with any questions, concerns, or complaints.   Cancer Staging Primary cancer of upper inner quadrant of right female breast [facility]) Staging form: Breast, AJCC 7th Edition - Pathologic stage from 05/15/2016: Stage IA (T1c, N0, cM0) - Signed by Lloyd Huger, MD on 05/15/2016   Faythe Casa, NP  Patient was seen and evaluated independently and I agree with the assessment and plan as dictated above.  Lloyd Huger, MD 11/19/16 8:34 AM

## 2016-11-18 ENCOUNTER — Encounter: Payer: Self-pay | Admitting: Urology

## 2016-11-18 ENCOUNTER — Ambulatory Visit (INDEPENDENT_AMBULATORY_CARE_PROVIDER_SITE_OTHER): Payer: Medicare HMO | Admitting: Urology

## 2016-11-18 VITALS — BP 151/81 | HR 84 | Ht 62.0 in | Wt 195.0 lb

## 2016-11-18 DIAGNOSIS — N390 Urinary tract infection, site not specified: Secondary | ICD-10-CM | POA: Diagnosis not present

## 2016-11-18 DIAGNOSIS — N9089 Other specified noninflammatory disorders of vulva and perineum: Secondary | ICD-10-CM

## 2016-11-18 DIAGNOSIS — N1330 Unspecified hydronephrosis: Secondary | ICD-10-CM | POA: Insufficient documentation

## 2016-11-18 DIAGNOSIS — M109 Gout, unspecified: Secondary | ICD-10-CM | POA: Insufficient documentation

## 2016-11-18 DIAGNOSIS — N952 Postmenopausal atrophic vaginitis: Secondary | ICD-10-CM | POA: Diagnosis not present

## 2016-11-18 DIAGNOSIS — N3941 Urge incontinence: Secondary | ICD-10-CM

## 2016-11-18 DIAGNOSIS — C539 Malignant neoplasm of cervix uteri, unspecified: Secondary | ICD-10-CM | POA: Insufficient documentation

## 2016-11-18 LAB — URINALYSIS, COMPLETE
Bilirubin, UA: NEGATIVE
Glucose, UA: NEGATIVE
KETONES UA: NEGATIVE
LEUKOCYTES UA: NEGATIVE
Nitrite, UA: NEGATIVE
PROTEIN UA: NEGATIVE
RBC UA: NEGATIVE
SPEC GRAV UA: 1.01 (ref 1.005–1.030)
Urobilinogen, Ur: 0.2 mg/dL (ref 0.2–1.0)
pH, UA: 5.5 (ref 5.0–7.5)

## 2016-11-18 LAB — BLADDER SCAN AMB NON-IMAGING

## 2016-11-18 MED ORDER — FLUCONAZOLE 150 MG PO TABS: 150.0000 mg | ORAL_TABLET | Freq: Once | ORAL | 0 refills | Status: AC

## 2016-11-18 NOTE — Progress Notes (Signed)
11/18/2016 1:55 PM   Rachel Jones 11/24/1933 [id number]  Referring provider: Idelle Crouch, MD [place] [address] [facility] [place], [place] [address]  Chief Complaint  Patient presents with  . Recurrent UTI    New Patient    HPI: 83 year old female referred for further evaluation of recurrent urinary tract infections.   She does have a personal history of ER/PR positive invasive breast cancer status post recent breast radiation.  She is currently on letrazole.    She's had multiple documented infections over the past year including Klebsiella on 09/26/2016, Klebsiella on 07/18/2016, Staph aureus on 06/2016, Escherichia coli 04/2016, and Klebsiella 7/17.  Today, she does complain of 3 days of vaginal irritation and burning following urination. She thinks she may have another infection.  UA today is completely negative, no evidence of urinary tract infection.    PVR 0   She does also report today that she says issues with urinary frequency and urgency along with urge incontinence. She has to change her pad/diaper every 2-3 hours to desaturation. She notes that she'll have the urge to urinate but unable to get to the bathroom in time. These are large volume incontinence. She denies any significant stress urinary incontinence. This been going on for quite some time (years). It is significantly bothersome to her. She does get up twice at night to urinate.  She has completely cut out so that another irritating beverages from her diet. She drinks only water. This is helped a little bit with her symptoms.    she has not tried any medications for OAB.  She does have chronic constipation. She does not take a bowel regimen. She is a stool every other day on average.  PMH: Past Medical History:  Diagnosis Date  . Anemia    is followed by renal doctor and pcp  . Arthritis   . Cancer [facility]) 1961   uterine cancer  . Coronary artery disease   . Dyspnea   . GERD  (gastroesophageal reflux disease)   . Hypertension   . Kidney disease    stage 4.  followed by dr. Sedonia Small  . Lower extremity edema   . S/P lumpectomy, right breast    having done on 04/26/16  . Sleep apnea    uses cpap    Surgical History: Past Surgical History:  Procedure Laterality Date  . BREAST LUMPECTOMY Right    04/26/16  . CAROTID STENT Left 2016   was having pain down neck. carotid artery clogged. dr. Delana Meyer put stent in  . CAROTID STENT Left   . CATARACT EXTRACTION W/ INTRAOCULAR LENS  IMPLANT, BILATERAL Bilateral 2009  . EYE SURGERY  2009   cataracts  . PARTIAL HYSTERECTOMY  1961   uterus only removed  . PARTIAL MASTECTOMY WITH NEEDLE LOCALIZATION Right 04/26/2016   Procedure: PARTIAL MASTECTOMY WITH NEEDLE LOCALIZATION;  Surgeon: Leonie Green, MD;  Location: [facility] ORS;  Service: General;  Laterality: Right;  . SENTINEL NODE BIOPSY Right 04/26/2016   Procedure: SENTINEL NODE BIOPSY;  Surgeon: Leonie Green, MD;  Location: [facility] ORS;  Service: General;  Laterality: Right;    Home Medications:  Allergies as of 11/18/2016      Reactions   Codeine Nausea Only   Intolerance instead of true allerg   Sulfa Antibiotics Swelling, Rash   swelling      Medication List       Accurate as of 11/18/16 11:59 PM. Always use your most recent med list.  acetaminophen 500 MG tablet Commonly known as:  TYLENOL Take 1,000 mg by mouth daily. May take an additional 1000 mgs as needed for pain   alendronate 70 MG tablet Commonly known as:  FOSAMAX Take 1 tablet (70 mg total) by mouth once a week. Take with a full glass of water on an empty stomach.   aspirin EC 81 MG tablet Take 81 mg by mouth at bedtime.   calcium-vitamin D 500-200 MG-UNIT tablet Commonly known as:  OSCAL WITH D Take 1 tablet by mouth 2 (two) times daily.   clopidogrel 75 MG tablet Commonly known as:  PLAVIX TAKE 1 TABLET EVERY DAY   esomeprazole 40 MG capsule Commonly known as:   NEXIUM Take 40 mg by mouth daily.   ferrous gluconate 324 MG tablet Commonly known as:  FERGON Take 324 mg by mouth daily.   fluconazole 150 MG tablet Commonly known as:  DIFLUCAN Take 1 tablet (150 mg total) by mouth once.   furosemide 20 MG tablet Commonly known as:  LASIX Take 20 mg by mouth daily as needed for edema.   letrozole 2.5 MG tablet Commonly known as:  FEMARA Take 1 tablet (2.5 mg total) by mouth daily.   losartan 50 MG tablet Commonly known as:  COZAAR Take 50 mg by mouth daily.   lovastatin 40 MG tablet Commonly known as:  MEVACOR Take 40 mg by mouth 2 (two) times daily.   metoprolol tartrate 25 MG tablet Commonly known as:  LOPRESSOR Take 25 mg by mouth 2 (two) times daily.   silver sulfADIAZINE 1 % cream Commonly known as:  SILVADENE Apply 1 application topically 2 (two) times daily.   VITAMIN B-12 IJ Inject 1,000 mcg as directed every 30 (thirty) days.   Vitamin D3 1000 units Caps Take 1,000 Units by mouth daily.       Allergies:  Allergies  Allergen Reactions  . Codeine Nausea Only    Intolerance instead of true allerg  . Sulfa Antibiotics Swelling and Rash    swelling    Family History: Family History  Problem Relation Age of Onset  . Bladder Cancer Neg Hx   . Kidney cancer Neg Hx   . Prolactinoma Neg Hx   . Prostate cancer Neg Hx     Social History:  reports that she has never smoked. She has never used smokeless tobacco. She reports that she does not drink alcohol or use drugs.  ROS: UROLOGY Frequent Urination?: Yes Hard to postpone urination?: Yes Burning/pain with urination?: Yes Get up at night to urinate?: Yes Leakage of urine?: Yes Urine stream starts and stops?: No Trouble starting stream?: No Do you have to strain to urinate?: No Blood in urine?: No Urinary tract infection?: Yes Sexually transmitted disease?: No Injury to kidneys or bladder?: No Painful intercourse?: No Weak stream?: No Currently pregnant?:  No Vaginal bleeding?: No Last menstrual period?: hysterectomy  Gastrointestinal Nausea?: No Vomiting?: No Indigestion/heartburn?: Yes Diarrhea?: No Constipation?: No  Constitutional Fever: No Night sweats?: No Weight loss?: No Fatigue?: Yes  Skin Skin rash/lesions?: No Itching?: No  Eyes Blurred vision?: No Double vision?: No  Ears/Nose/Throat Sore throat?: No Sinus problems?: No  Hematologic/Lymphatic Swollen glands?: No Easy bruising?: Yes  Cardiovascular Leg swelling?: Yes Chest pain?: No  Respiratory Cough?: No Shortness of breath?: Yes  Endocrine Excessive thirst?: No  Musculoskeletal Back pain?: Yes Joint pain?: Yes  Neurological Headaches?: No Dizziness?: No  Psychologic Depression?: No Anxiety?: No  Physical Exam: BP (!) 151/81  Pulse 84   Ht 5\' 2"  (1.575 m)   Wt 195 lb (88.5 kg)   BMI 35.67 kg/m   Constitutional:  Alert and oriented, No acute distress.  Elderly. Accompanied by daughter-in-law. HEENT: [place] AT, moist mucus membranes.  Trachea midline, no masses. Cardiovascular: No clubbing, cyanosis, or edema. Respiratory: Normal respiratory effort, no increased work of breathing. GI: Abdomen is soft, nontender, nondistended, no abdominal masses.  Obese. GU: Severe atrophic vaginitis. Labia minora adhesions, barely able to accommodate a small speculum, urethral partially obstructed by labial adhesion. Vaginal discharge. Skin: No rashes, bruises or suspicious lesions. Lymph: No cervical or inguinal adenopathy. Neurologic: Grossly intact, no focal deficits, moving all 4 extremities. Psychiatric: Normal mood and affect.  Laboratory Data: Lab Results  Component Value Date   WBC 6.7 07/13/2016   HGB 12.2 07/13/2016   HCT 36.9 07/13/2016   MCV 92.9 07/13/2016   PLT 178 07/13/2016    Lab Results  Component Value Date   CREATININE 1.74 (H) 07/28/2016    Urinalysis Results for orders placed or performed in visit on 11/18/16    Urinalysis, Complete  Result Value Ref Range   Specific Gravity, UA 1.010 1.005 - 1.030   pH, UA 5.5 5.0 - 7.5   Color, UA Yellow Yellow   Appearance Ur Clear Clear   Leukocytes, UA Negative Negative   Protein, UA Negative Negative/Trace   Glucose, UA Negative Negative   Ketones, UA Negative Negative   RBC, UA Negative Negative   Bilirubin, UA Negative Negative   Urobilinogen, Ur 0.2 0.2 - 1.0 mg/dL   Nitrite, UA Negative Negative  BLADDER SCAN AMB NON-IMAGING  Result Value Ref Range   Scan Result 52ml      Pertinent Imaging: Results for orders placed or performed in visit on 11/18/16  Urinalysis, Complete  Result Value Ref Range   Specific Gravity, UA 1.010 1.005 - 1.030   pH, UA 5.5 5.0 - 7.5   Color, UA Yellow Yellow   Appearance Ur Clear Clear   Leukocytes, UA Negative Negative   Protein, UA Negative Negative/Trace   Glucose, UA Negative Negative   Ketones, UA Negative Negative   RBC, UA Negative Negative   Bilirubin, UA Negative Negative   Urobilinogen, Ur 0.2 0.2 - 1.0 mg/dL   Nitrite, UA Negative Negative  BLADDER SCAN AMB NON-IMAGING  Result Value Ref Range   Scan Result 42ml      Assessment & Plan:    1. Recurrent UTI Lengthy discussion today about pathophysiology of recurrent urinary tract infections in a postmenopausal woman Discussed hygienebehavioral modification techniques at length In addition to the above, the following recommendations are made: -Daily probiotic -Carberry tabs twice daily -Drink plenty water -Recommend catheterized specimen in our office for any suspected urinary tract infection, based on the results and frequency of infections, may consider suppressive antibiotic as needed -Recommend start of a daily bowel regimen such as Colace in order to achieve a daily soft bowel movement  - Urinalysis, Complete - BLADDER SCAN AMB NON-IMAGING  2. Atrophic vaginitis Severe atrophic vaginitis exacerbated by left result medication, not a  candidate for estrogen cream given personal history of breast cancer  We'll go ahead and treat her today for possible vaginal candidiasis given recent antibiotic administration and vaginal irritation which may be partially related to Candida  3. Labial adhesions Same as above, not a candidate for topical estrogen cream  4. Urge incontinence Behavioral modification discussed Given samples of Mybetriq 25 mg 4 weeks for trial,  possible side effects discussed   Return in about 1 month (around 12/18/2016) for [facility] f/u PVR, BP, reassess urinary symptoms.  Hollice Espy, MD  [facility] [address] [place],  [address] [phone]

## 2016-11-18 NOTE — Patient Instructions (Addendum)
Probiotic daily  Cranberry tabs twice day  Drink plenty of water  Come in to our office anytime you think you have a UTI  Start a daily bowel regimen such as Colace to help have a bowel movement every day.

## 2016-12-12 DIAGNOSIS — E538 Deficiency of other specified B group vitamins: Secondary | ICD-10-CM | POA: Diagnosis not present

## 2016-12-12 DIAGNOSIS — D508 Other iron deficiency anemias: Secondary | ICD-10-CM | POA: Diagnosis not present

## 2016-12-18 NOTE — Progress Notes (Signed)
12/19/2016 2:15 PM   Rachel Jones August 31, 1933 [id number]  Referring provider: Idelle Crouch, MD [facility] [place], [place] [address]  Chief Complaint  Patient presents with  . Recurrent UTI    35month    HPI: 83 yo WF with recurrent UTI's, vaginal atrophy, labial adhesions and urge incontinence who presents today for a one month follow up after a trial of Myrbetriq.  Background history 83 year old female referred for further evaluation of recurrent urinary tract infections.   She does have a personal history of ER/PR positive invasive breast cancer status post recent breast radiation.  She is currently on letrazole.  She's had multiple documented infections over the past year including Klebsiella on 09/26/2016, Klebsiella on 07/18/2016, Staph aureus on 06/2016, Escherichia coli 04/2016, and Klebsiella 7/17.  Today, she does complain of 3 days of vaginal irritation and burning following urination. She thinks she may have another infection.  UA today is completely negative, no evidence of urinary tract infection.  PVR 0   She does also report today that she says issues with urinary frequency and urgency along with urge incontinence. She has to change her pad/diaper every 2-3 hours to desaturation. She notes that she'll have the urge to urinate but unable to get to the bathroom in time. These are large volume incontinence. She denies any significant stress urinary incontinence. This been going on for quite some time (years). It is significantly bothersome to her. She does get up twice at night to urinate.  She has completely cut out so that another irritating beverages from her diet. She drinks only water. This is helped a little bit with her symptoms.   she has not tried any medications for OAB.  She does have chronic constipation. She does not take a bowel regimen. She is a stool every other day on average.  Today, the patient has been experiencing urgency x 4-7  (unchanged), frequency x 4-7 (unchanged), not restricting fluids to avoid visits to the restroom, is engaging in toilet mapping, incontinence x 8 or more (unchanged) and nocturia x 4-7 (unchanged).   Her PVR is 10 mL.   Her BP is 133/79.   She denies any gross hematuria and suprapubic pain. She also denies any recent fevers, chills, nausea and vomiting.  She does not find any difference with the Myrbetriq.    She is taking the probiotics, cranberry tablets and drinking water.  She is also taking Colace for her constipation.    She is having dysuria.    PMH: Past Medical History:  Diagnosis Date  . Anemia    is followed by renal doctor and pcp  . Arthritis   . Cancer [facility]) 1961   uterine cancer  . Coronary artery disease   . Dyspnea   . GERD (gastroesophageal reflux disease)   . Hypertension   . Kidney disease    stage 4.  followed by dr. Sedonia Small  . Lower extremity edema   . S/P lumpectomy, right breast    having done on 04/26/16  . Sleep apnea    uses cpap    Surgical History: Past Surgical History:  Procedure Laterality Date  . BREAST LUMPECTOMY Right    04/26/16  . CAROTID STENT Left 2016   was having pain down neck. carotid artery clogged. dr. Delana Meyer put stent in  . CAROTID STENT Left   . CATARACT EXTRACTION W/ INTRAOCULAR LENS  IMPLANT, BILATERAL Bilateral 2009  . EYE SURGERY  2009  cataracts  . PARTIAL HYSTERECTOMY  1961   uterus only removed  . PARTIAL MASTECTOMY WITH NEEDLE LOCALIZATION Right 04/26/2016   Procedure: PARTIAL MASTECTOMY WITH NEEDLE LOCALIZATION;  Surgeon: Leonie Green, MD;  Location: [facility] ORS;  Service: General;  Laterality: Right;  . SENTINEL NODE BIOPSY Right 04/26/2016   Procedure: SENTINEL NODE BIOPSY;  Surgeon: Leonie Green, MD;  Location: [facility] ORS;  Service: General;  Laterality: Right;    Home Medications:  Allergies as of 12/19/2016      Reactions   Codeine Nausea Only   Intolerance instead of true allerg   Sulfa Antibiotics  Swelling, Rash   swelling      Medication List       Accurate as of 12/19/16  2:15 PM. Always use your most recent med list.          acetaminophen 500 MG tablet Commonly known as:  TYLENOL Take 1,000 mg by mouth daily. May take an additional 1000 mgs as needed for pain   alendronate 70 MG tablet Commonly known as:  FOSAMAX Take 1 tablet (70 mg total) by mouth once a week. Take with a full glass of water on an empty stomach.   aspirin EC 81 MG tablet Take 81 mg by mouth at bedtime.   calcium-vitamin D 500-200 MG-UNIT tablet Commonly known as:  OSCAL WITH D Take 1 tablet by mouth 2 (two) times daily.   clopidogrel 75 MG tablet Commonly known as:  PLAVIX TAKE 1 TABLET EVERY DAY   esomeprazole 40 MG capsule Commonly known as:  NEXIUM Take 40 mg by mouth daily.   ferrous gluconate 324 MG tablet Commonly known as:  FERGON Take 324 mg by mouth daily.   furosemide 20 MG tablet Commonly known as:  LASIX Take 20 mg by mouth daily as needed for edema.   letrozole 2.5 MG tablet Commonly known as:  FEMARA Take 1 tablet (2.5 mg total) by mouth daily.   losartan 50 MG tablet Commonly known as:  COZAAR Take 50 mg by mouth daily.   lovastatin 40 MG tablet Commonly known as:  MEVACOR Take 40 mg by mouth 2 (two) times daily.   metoprolol tartrate 25 MG tablet Commonly known as:  LOPRESSOR Take 25 mg by mouth 2 (two) times daily.   silver sulfADIAZINE 1 % cream Commonly known as:  SILVADENE Apply 1 application topically 2 (two) times daily.   VITAMIN B-12 IJ Inject 1,000 mcg as directed every 30 (thirty) days.   Vitamin D3 1000 units Caps Take 1,000 Units by mouth daily.       Allergies:  Allergies  Allergen Reactions  . Codeine Nausea Only    Intolerance instead of true allerg  . Sulfa Antibiotics Swelling and Rash    swelling    Family History: Family History  Problem Relation Age of Onset  . Bladder Cancer Neg Hx   . Kidney cancer Neg Hx   .  Prolactinoma Neg Hx   . Prostate cancer Neg Hx     Social History:  reports that she has never smoked. She has never used smokeless tobacco. She reports that she does not drink alcohol or use drugs.  ROS: UROLOGY Frequent Urination?: Yes Hard to postpone urination?: Yes Burning/pain with urination?: Yes Get up at night to urinate?: Yes Leakage of urine?: Yes Urine stream starts and stops?: No Trouble starting stream?: No Do you have to strain to urinate?: No Blood in urine?: No Urinary tract infection?: Yes Sexually transmitted  disease?: No Injury to kidneys or bladder?: No Painful intercourse?: No Weak stream?: No Currently pregnant?: No Vaginal bleeding?: No Last menstrual period?: n  Gastrointestinal Nausea?: No Vomiting?: No Indigestion/heartburn?: No Diarrhea?: No Constipation?: No  Constitutional Fever: No Night sweats?: No Weight loss?: No Fatigue?: No  Skin Skin rash/lesions?: No Itching?: No  Eyes Blurred vision?: No Double vision?: No  Ears/Nose/Throat Sore throat?: No Sinus problems?: No  Hematologic/Lymphatic Swollen glands?: No Easy bruising?: No  Cardiovascular Leg swelling?: Yes Chest pain?: No  Respiratory Cough?: No Shortness of breath?: No  Endocrine Excessive thirst?: No  Musculoskeletal Back pain?: Yes Joint pain?: No  Neurological Headaches?: No Dizziness?: No  Psychologic Depression?: No Anxiety?: No  Physical Exam: BP 133/79   Pulse 76   Ht 5\' 2"  (1.575 m)   Wt 195 lb (88.5 kg)   BMI 35.67 kg/m   Constitutional:  Alert and oriented, No acute distress.  Elderly. Accompanied by daughter-in-law. HEENT: [place] AT, moist mucus membranes.  Trachea midline, no masses. Cardiovascular: No clubbing, cyanosis, or edema. Respiratory: Normal respiratory effort, no increased work of breathing. Skin: No rashes, bruises or suspicious lesions. Lymph: No cervical or inguinal adenopathy. Neurologic: Grossly intact, no focal  deficits, moving all 4 extremities. Psychiatric: Normal mood and affect.  Laboratory Data: Lab Results  Component Value Date   WBC 6.7 07/13/2016   HGB 12.2 07/13/2016   HCT 36.9 07/13/2016   MCV 92.9 07/13/2016   PLT 178 07/13/2016    Lab Results  Component Value Date   CREATININE 1.74 (H) 07/28/2016   I have reviewed the labs  Assessment & Plan:    1. Recurrent UTI  - symptomatic at this time - not enough urine for UA - sent for culture  -Daily probiotic - taking  -Carberry tabs twice daily - taking  -Drink plenty water - doing  -Recommend catheterized specimen in our office for any suspected urinary tract infection, based on the results and  frequency of infections, may consider suppressive antibiotic as needed  -Recommend start of a daily bowel regimen such as Colace in order to achieve a daily soft bowel movement - doing  2. Atrophic vaginitis Severe atrophic vaginitis exacerbated by left result medication, not a candidate for estrogen cream given personal history of breast cancer  3. Labial adhesions Same as above, not a candidate for topical estrogen cream  4. Urge incontinence  - Behavioral modification discussed  - Mybetriq 25 mg not effective   - discussed PTNS with the patient and she is interested if insurance will pay for the treatments   Return for PTNS.  Zara Council, PA-C  [facility] [address] [place],  [address] [phone]

## 2016-12-19 ENCOUNTER — Ambulatory Visit: Payer: Medicare HMO | Admitting: Urology

## 2016-12-19 ENCOUNTER — Encounter: Payer: Self-pay | Admitting: Urology

## 2016-12-19 VITALS — BP 133/79 | HR 76 | Ht 62.0 in | Wt 195.0 lb

## 2016-12-19 DIAGNOSIS — N390 Urinary tract infection, site not specified: Secondary | ICD-10-CM

## 2016-12-19 DIAGNOSIS — N3941 Urge incontinence: Secondary | ICD-10-CM

## 2016-12-19 DIAGNOSIS — N952 Postmenopausal atrophic vaginitis: Secondary | ICD-10-CM | POA: Diagnosis not present

## 2016-12-19 DIAGNOSIS — N9089 Other specified noninflammatory disorders of vulva and perineum: Secondary | ICD-10-CM

## 2016-12-19 LAB — URINALYSIS, COMPLETE
BILIRUBIN UA: NEGATIVE
GLUCOSE, UA: NEGATIVE
Ketones, UA: NEGATIVE
NITRITE UA: NEGATIVE
PROTEIN UA: NEGATIVE
RBC, UA: NEGATIVE
Specific Gravity, UA: >1.03 — ABNORMAL HIGH (ref 1.005–1.030)
UUROB: 0.2 mg/dL (ref 0.2–1.0)
pH, UA: 6 (ref 5.0–7.5)

## 2016-12-19 LAB — MICROSCOPIC EXAMINATION

## 2016-12-19 NOTE — Progress Notes (Signed)
In and Out Catheterization  Patient is present today for a I & O catheterization due to recurrent UTI. Patient was cleaned and prepped in a sterile fashion with betadine and Lidocaine 2% jelly was instilled into the urethra.  A 14FR cath was inserted no complications were noted , 21ml of urine return was noted, urine was clear yellow in color. A clean urine sample was collected for UA and Culture. Bladder was drained  And catheter was removed with out difficulty.    Preformed by: Fonnie Jarvis, CMA

## 2016-12-22 ENCOUNTER — Telehealth: Payer: Self-pay | Admitting: Urology

## 2016-12-22 NOTE — Telephone Encounter (Signed)
Please check on insurance coverage for PTNS for this patient.

## 2016-12-23 LAB — CULTURE, URINE COMPREHENSIVE

## 2016-12-23 MED ORDER — CIPROFLOXACIN HCL 250 MG PO TABS: 250.0000 mg | ORAL_TABLET | Freq: Two times a day (BID) | ORAL | 0 refills | Status: DC

## 2016-12-23 NOTE — Telephone Encounter (Signed)
+  UCx, will treat with cipro 250 mg bid x 7  Called to let patient know.  Prescriptions into [facility].  Patient also notes that she thought about PTNS and does not want to pursue it at this time.  Hollice Espy, MD

## 2017-01-02 DIAGNOSIS — Z79899 Other long term (current) drug therapy: Secondary | ICD-10-CM | POA: Diagnosis not present

## 2017-01-02 DIAGNOSIS — R0602 Shortness of breath: Secondary | ICD-10-CM | POA: Diagnosis not present

## 2017-01-02 DIAGNOSIS — Z1231 Encounter for screening mammogram for malignant neoplasm of breast: Secondary | ICD-10-CM | POA: Diagnosis not present

## 2017-01-02 DIAGNOSIS — Z87440 Personal history of urinary (tract) infections: Secondary | ICD-10-CM | POA: Diagnosis not present

## 2017-01-02 DIAGNOSIS — G4733 Obstructive sleep apnea (adult) (pediatric): Secondary | ICD-10-CM | POA: Diagnosis not present

## 2017-01-02 DIAGNOSIS — N184 Chronic kidney disease, stage 4 (severe): Secondary | ICD-10-CM | POA: Diagnosis not present

## 2017-01-02 DIAGNOSIS — E782 Mixed hyperlipidemia: Secondary | ICD-10-CM | POA: Diagnosis not present

## 2017-01-02 DIAGNOSIS — I10 Essential (primary) hypertension: Secondary | ICD-10-CM | POA: Diagnosis not present

## 2017-01-02 DIAGNOSIS — D508 Other iron deficiency anemias: Secondary | ICD-10-CM | POA: Diagnosis not present

## 2017-01-12 DIAGNOSIS — E538 Deficiency of other specified B group vitamins: Secondary | ICD-10-CM | POA: Diagnosis not present

## 2017-01-12 DIAGNOSIS — E875 Hyperkalemia: Secondary | ICD-10-CM | POA: Diagnosis not present

## 2017-02-09 ENCOUNTER — Ambulatory Visit
Admission: RE | Admit: 2017-02-09 | Discharge: 2017-02-09 | Disposition: A | Discharge status: Home / Self Care | Payer: Medicare HMO | Source: Ambulatory Visit | Attending: Radiation Oncology | Admitting: Radiation Oncology

## 2017-02-09 ENCOUNTER — Encounter: Payer: Self-pay | Admitting: Radiation Oncology

## 2017-02-09 VITALS — BP 163/82 | HR 68 | Temp 97.0°F | Wt 198.6 lb

## 2017-02-09 DIAGNOSIS — C50211 Malignant neoplasm of upper-inner quadrant of right female breast: Secondary | ICD-10-CM

## 2017-02-09 DIAGNOSIS — C50412 Malignant neoplasm of upper-outer quadrant of left female breast: Secondary | ICD-10-CM | POA: Diagnosis not present

## 2017-02-09 DIAGNOSIS — Z170 Estrogen receptor positive status [ER+]: Secondary | ICD-10-CM

## 2017-02-09 DIAGNOSIS — Z923 Personal history of irradiation: Secondary | ICD-10-CM | POA: Diagnosis not present

## 2017-02-13 DIAGNOSIS — E538 Deficiency of other specified B group vitamins: Secondary | ICD-10-CM | POA: Diagnosis not present

## 2017-02-20 DIAGNOSIS — N184 Chronic kidney disease, stage 4 (severe): Secondary | ICD-10-CM | POA: Diagnosis not present

## 2017-02-20 DIAGNOSIS — R809 Proteinuria, unspecified: Secondary | ICD-10-CM | POA: Diagnosis not present

## 2017-02-20 DIAGNOSIS — E875 Hyperkalemia: Secondary | ICD-10-CM | POA: Diagnosis not present

## 2017-02-20 DIAGNOSIS — I129 Hypertensive chronic kidney disease with stage 1 through stage 4 chronic kidney disease, or unspecified chronic kidney disease: Secondary | ICD-10-CM | POA: Diagnosis not present

## 2017-03-16 DIAGNOSIS — Z23 Encounter for immunization: Secondary | ICD-10-CM | POA: Diagnosis not present

## 2017-03-16 DIAGNOSIS — E538 Deficiency of other specified B group vitamins: Secondary | ICD-10-CM | POA: Diagnosis not present

## 2017-03-20 DIAGNOSIS — M069 Rheumatoid arthritis, unspecified: Secondary | ICD-10-CM | POA: Diagnosis not present

## 2017-04-06 ENCOUNTER — Ambulatory Visit (INDEPENDENT_AMBULATORY_CARE_PROVIDER_SITE_OTHER): Payer: Commercial Managed Care - HMO | Admitting: Vascular Surgery

## 2017-04-06 ENCOUNTER — Encounter (INDEPENDENT_AMBULATORY_CARE_PROVIDER_SITE_OTHER): Payer: Self-pay | Admitting: Vascular Surgery

## 2017-04-06 ENCOUNTER — Ambulatory Visit (INDEPENDENT_AMBULATORY_CARE_PROVIDER_SITE_OTHER): Payer: Commercial Managed Care - HMO

## 2017-04-06 VITALS — BP 153/71 | HR 60 | Resp 16 | Ht 62.0 in | Wt 198.0 lb

## 2017-04-06 DIAGNOSIS — I872 Venous insufficiency (chronic) (peripheral): Secondary | ICD-10-CM

## 2017-04-06 DIAGNOSIS — I10 Essential (primary) hypertension: Secondary | ICD-10-CM | POA: Diagnosis not present

## 2017-04-06 DIAGNOSIS — N184 Chronic kidney disease, stage 4 (severe): Secondary | ICD-10-CM | POA: Diagnosis not present

## 2017-04-06 DIAGNOSIS — I6523 Occlusion and stenosis of bilateral carotid arteries: Secondary | ICD-10-CM

## 2017-04-07 ENCOUNTER — Encounter (INDEPENDENT_AMBULATORY_CARE_PROVIDER_SITE_OTHER): Payer: Self-pay | Admitting: Vascular Surgery

## 2017-04-07 NOTE — Progress Notes (Signed)
MRN : [id number]  Rachel Jones is a 83 y.o. (1933-12-03) female who presents with chief complaint of  Chief Complaint  Patient presents with  . Follow-up    53mos. carotid  .  History of Present Illness: The patient is seen for follow up evaluation of carotid stenosis. The carotid stenosis followed by ultrasound.   The patient denies amaurosis fugax. There is no recent history of TIA symptoms or focal motor deficits. There is no prior documented CVA.  The patient is taking enteric-coated aspirin 81 mg daily.  There is no history of migraine headaches. There is no history of seizures.  The patient has a history of coronary artery disease, no recent episodes of angina or shortness of breath. The patient denies PAD or claudication symptoms. There is a history of hyperlipidemia which is being treated with a statin.      Current Meds  Medication Sig  . acetaminophen (TYLENOL) 500 MG tablet Take 1,000 mg by mouth daily. May take an additional 1000 mgs as needed for pain  . alendronate (FOSAMAX) 70 MG tablet Take 1 tablet (70 mg total) by mouth once a week. Take with a full glass of water on an empty stomach.  Marland Kitchen aspirin EC 81 MG tablet Take 81 mg by mouth at bedtime.   . calcium-vitamin D (OSCAL WITH D) 500-200 MG-UNIT tablet Take 1 tablet by mouth 2 (two) times daily.  . Cholecalciferol (VITAMIN D3) 1000 units CAPS Take 1,000 Units by mouth daily.   . ciprofloxacin (CIPRO) 250 MG tablet Take 1 tablet (250 mg total) by mouth 2 (two) times daily.  . clopidogrel (PLAVIX) 75 MG tablet TAKE 1 TABLET EVERY DAY  . Cranberry 500 MG TABS Take by mouth 2 (two) times daily.  . Cyanocobalamin (VITAMIN B-12 IJ) Inject 1,000 mcg as directed every 30 (thirty) days.  Marland Kitchen docusate sodium (STOOL SOFTENER) 100 MG capsule Take 100 mg by mouth 2 (two) times daily.  Marland Kitchen esomeprazole (NEXIUM) 40 MG capsule Take 40 mg by mouth daily.  . ferrous gluconate (FERGON) 324 MG tablet Take 324 mg by mouth daily.  .  furosemide (LASIX) 20 MG tablet Take 20 mg by mouth daily as needed for edema.   Marland Kitchen letrozole (FEMARA) 2.5 MG tablet Take 1 tablet (2.5 mg total) by mouth daily.  Marland Kitchen losartan (COZAAR) 50 MG tablet Take 50 mg by mouth daily.  Marland Kitchen lovastatin (MEVACOR) 40 MG tablet Take 40 mg by mouth 2 (two) times daily.  . metoprolol tartrate (LOPRESSOR) 25 MG tablet Take 25 mg by mouth 2 (two) times daily.  . Probiotic Product (PROBIOTIC ADVANCED) CAPS Take by mouth.    Past Medical History:  Diagnosis Date  . Anemia    is followed by renal doctor and pcp  . Arthritis   . Cancer [facility]) 1961   uterine cancer  . Coronary artery disease   . Dyspnea   . GERD (gastroesophageal reflux disease)   . Hypertension   . Kidney disease    stage 4.  followed by dr. Sedonia Small  . Lower extremity edema   . S/P lumpectomy, right breast    having done on 04/26/16  . Sleep apnea    uses cpap    Past Surgical History:  Procedure Laterality Date  . BREAST LUMPECTOMY Right    04/26/16  . CAROTID STENT Left 2016   was having pain down neck. carotid artery clogged. dr. Delana Meyer put stent in  . CAROTID STENT Left   .  CATARACT EXTRACTION W/ INTRAOCULAR LENS  IMPLANT, BILATERAL Bilateral 2009  . EYE SURGERY  2009   cataracts  . PARTIAL HYSTERECTOMY  1961   uterus only removed  . PARTIAL MASTECTOMY WITH NEEDLE LOCALIZATION Right 04/26/2016   Performed by Leonie Green, MD at [facility] ORS  . SENTINEL NODE BIOPSY Right 04/26/2016   Performed by Leonie Green, MD at [place] History   Tobacco Use  . Smoking status: Never Smoker  . Smokeless tobacco: Never Used  Substance Use Topics  . Alcohol use: No  . Drug use: No    Family History Family History  Problem Relation Age of Onset  . Bladder Cancer Neg Hx   . Kidney cancer Neg Hx   . Prolactinoma Neg Hx   . Prostate cancer Neg Hx     Allergies  Allergen Reactions  . Codeine Nausea Only    Intolerance instead of true allerg    . Sulfa Antibiotics Swelling and Rash    swelling     REVIEW OF SYSTEMS (Negative unless checked)  Constitutional: [] Weight loss  [] Fever  [] Chills Cardiac: [] Chest pain   [] Chest pressure   [] Palpitations   [] Shortness of breath when laying flat   [] Shortness of breath with exertion. Vascular:  [] Pain in legs with walking   [] Pain in legs at rest  [] History of DVT   [] Phlebitis   [] Swelling in legs   [] Varicose veins   [] Non-healing ulcers Pulmonary:   [] Uses home oxygen   [] Productive cough   [] Hemoptysis   [] Wheeze  [] COPD   [] Asthma Neurologic:  [] Dizziness   [] Seizures   [] History of stroke   [] History of TIA  [] Aphasia   [] Vissual changes   [] Weakness or numbness in arm   [] Weakness or numbness in leg Musculoskeletal:   [] Joint swelling   [] Joint pain   [] Low back pain Hematologic:  [] Easy bruising  [] Easy bleeding   [] Hypercoagulable state   [] Anemic Gastrointestinal:  [] Diarrhea   [] Vomiting  [] Gastroesophageal reflux/heartburn   [] Difficulty swallowing. Genitourinary:  [] Chronic kidney disease   [] Difficult urination  [] Frequent urination   [] Blood in urine Skin:  [] Rashes   [] Ulcers  Psychological:  [] History of anxiety   []  History of major depression.  Physical Examination  Vitals:   04/06/17 1509  BP: (!) 153/71  Pulse: 60  Resp: 16  Weight: 89.8 kg (198 lb)  Height: 5\' 2"  (1.575 m)   Body mass index is 36.21 kg/m. Gen: WD/WN, NAD Head: [place]/AT, No temporalis wasting.  Ear/Nose/Throat: Hearing grossly intact, nares w/o erythema or drainage Eyes: PER, EOMI, sclera nonicteric.  Neck: Supple, no large masses.   Pulmonary:  Good air movement, no audible wheezing bilaterally, no use of accessory muscles.  Cardiac: RRR, no JVD Vascular: Bilateral carotid bruits Vessel Right Left  Radial Palpable Palpable  Ulnar Palpable Palpable  Brachial Palpable Palpable  Carotid Palpable Palpable  Gastrointestinal: Non-distended. No guarding/no peritoneal signs.   Musculoskeletal: M/S 5/5 throughout.  No deformity or atrophy.  Neurologic: CN 2-12 intact. Symmetrical.  Speech is fluent. Motor exam as listed above. Psychiatric: Judgment intact, Mood & affect appropriate for pt's clinical situation. Dermatologic: No rashes or ulcers noted.  No changes consistent with cellulitis. Lymph : No lichenification or skin changes of chronic lymphedema.  CBC Lab Results  Component Value Date   WBC 6.7 07/13/2016   HGB 12.2 07/13/2016   HCT 36.9 07/13/2016   MCV 92.9 07/13/2016   PLT 178 07/13/2016  BMET    Component Value Date/Time   NA 138 07/28/2016 1025   NA 142 05/28/2014 0548   K 4.8 07/28/2016 1025   K 4.5 05/28/2014 0548   CL 106 07/28/2016 1025   CL 106 05/28/2014 0548   CO2 22 07/28/2016 1025   CO2 29 05/28/2014 0548   GLUCOSE 96 07/28/2016 1025   GLUCOSE 99 05/28/2014 0548   BUN 39 (H) 07/28/2016 1025   BUN 27 (H) 05/28/2014 0548   CREATININE 1.74 (H) 07/28/2016 1025   CREATININE 1.91 (H) 05/28/2014 0548   CALCIUM 8.9 07/28/2016 1025   CALCIUM 7.4 (L) 05/28/2014 0548   GFRNONAA 27 (L) 07/28/2016 1025   GFRNONAA 27 (L) 05/28/2014 0548   GFRAA 31 (L) 07/28/2016 1025   GFRAA 33 (L) 05/28/2014 0548   CrCl cannot be calculated (Patient's most recent lab result is older than the maximum 21 days allowed.).  COAG Lab Results  Component Value Date   INR 1.2 05/28/2014    Radiology No results found.   Assessment/Plan 1. Bilateral carotid artery stenosis Recommend:  Given the patient's asymptomatic subcritical stenosis no further invasive testing or surgery at this time.  Continue antiplatelet therapy as prescribed Continue management of CAD, HTN and Hyperlipidemia Healthy heart diet,  encouraged exercise at least 4 times per week  Follow up in 22months with duplex ultrasound and physical exam  - VAS US CAROTID; Future  2. Chronic venous insufficiency No surgery or intervention at this point in time.    I have had a  long discussion with the patient regarding venous insufficiency and why it  causes symptoms. I have discussed with the patient the chronic skin changes that accompany venous insufficiency and the long term sequela such as infection and ulceration.  Patient will begin wearing graduated compression stockings class 1 (20-30 mmHg) or compression wraps on a daily basis a prescription was given. The patient will put the stockings on first thing in the morning and removing them in the evening. The patient is instructed specifically not to sleep in the stockings.    In addition, behavioral modification including several periods of elevation of the lower extremities during the day will be continued. I have demonstrated that proper elevation is a position with the ankles at heart level.  The patient is instructed to begin routine exercise, especially walking on a daily basis  Following the review of the ultrasound the patient will follow up in 2-3 months to reassess the degree of swelling and the control that graduated compression stockings or compression wraps  is offering.   The patient can be assessed for a Lymph Pump at that time  3. Essential hypertension Continue antihypertensive medications as already ordered, these medications have been reviewed and there are no changes at this time.   4. CKD (chronic kidney disease) stage 4, GFR 15-29 ml/min (HCC) Avoid dehydration and nephrotoxic drugs.  Nephrology continues to follow    Hortencia Pilar, MD  04/07/2017 9:30 PM

## 2017-04-18 ENCOUNTER — Other Ambulatory Visit: Payer: Self-pay | Admitting: Internal Medicine

## 2017-04-18 ENCOUNTER — Other Ambulatory Visit (INDEPENDENT_AMBULATORY_CARE_PROVIDER_SITE_OTHER): Payer: Self-pay | Admitting: Vascular Surgery

## 2017-04-18 DIAGNOSIS — C50919 Malignant neoplasm of unspecified site of unspecified female breast: Secondary | ICD-10-CM

## 2017-04-20 DIAGNOSIS — E538 Deficiency of other specified B group vitamins: Secondary | ICD-10-CM | POA: Diagnosis not present

## 2017-04-25 DIAGNOSIS — N184 Chronic kidney disease, stage 4 (severe): Secondary | ICD-10-CM | POA: Diagnosis not present

## 2017-04-25 DIAGNOSIS — R300 Dysuria: Secondary | ICD-10-CM | POA: Diagnosis not present

## 2017-04-25 DIAGNOSIS — I10 Essential (primary) hypertension: Secondary | ICD-10-CM | POA: Diagnosis not present

## 2017-04-25 DIAGNOSIS — E782 Mixed hyperlipidemia: Secondary | ICD-10-CM | POA: Diagnosis not present

## 2017-04-25 DIAGNOSIS — G4733 Obstructive sleep apnea (adult) (pediatric): Secondary | ICD-10-CM | POA: Diagnosis not present

## 2017-04-25 DIAGNOSIS — D508 Other iron deficiency anemias: Secondary | ICD-10-CM | POA: Diagnosis not present

## 2017-04-25 DIAGNOSIS — E538 Deficiency of other specified B group vitamins: Secondary | ICD-10-CM | POA: Diagnosis not present

## 2017-04-25 DIAGNOSIS — Z0000 Encounter for general adult medical examination without abnormal findings: Secondary | ICD-10-CM | POA: Diagnosis not present

## 2017-04-25 DIAGNOSIS — Z79899 Other long term (current) drug therapy: Secondary | ICD-10-CM | POA: Diagnosis not present

## 2017-04-25 DIAGNOSIS — I6522 Occlusion and stenosis of left carotid artery: Secondary | ICD-10-CM | POA: Diagnosis not present

## 2017-05-08 DIAGNOSIS — Z853 Personal history of malignant neoplasm of breast: Secondary | ICD-10-CM | POA: Diagnosis not present

## 2017-05-17 IMAGING — MG MM PLC BREAST LOC DEV 1ST LESION INC*R*
5 series · 5 of 5 positions shown · non-contrast
Comparison: Previous exams.

CLINICAL DATA: Patient presents for mammographically guided needle
localization of a right breast lesion.

EXAM:
NEEDLE LOCALIZATION OF THE RIGHT BREAST WITH MAMMO GUIDANCE

[R ML (1 of 3)]
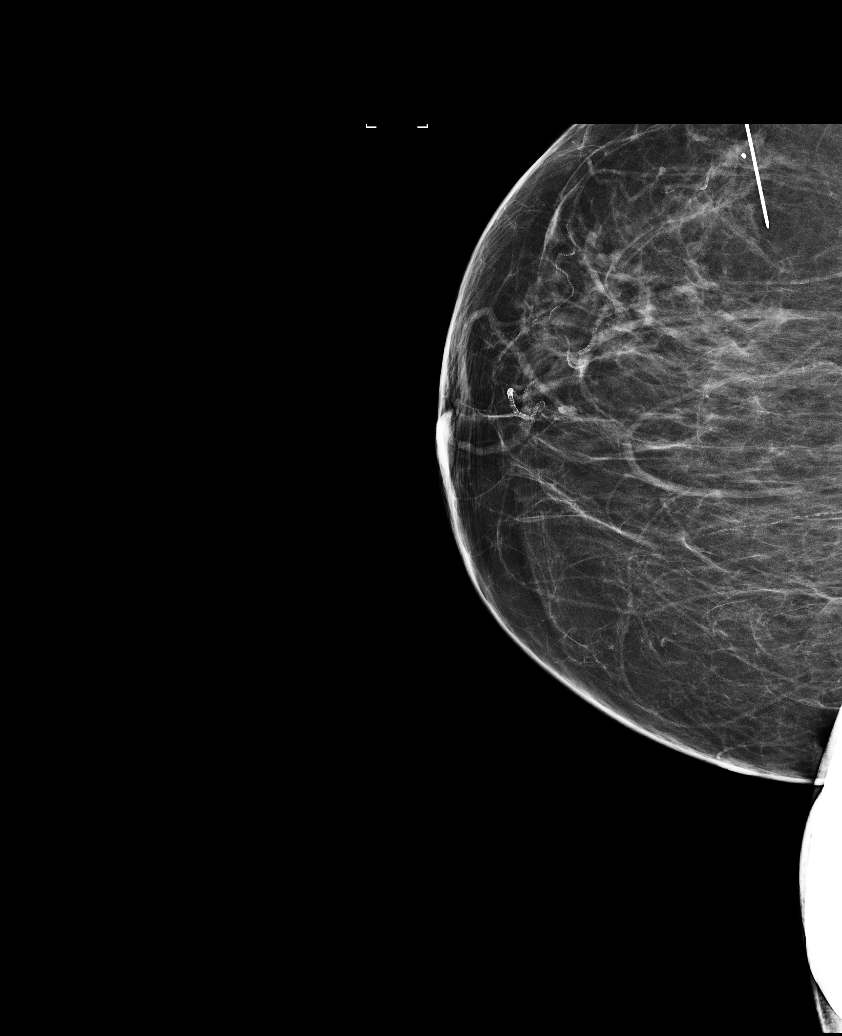

[R ML (2 of 3)]
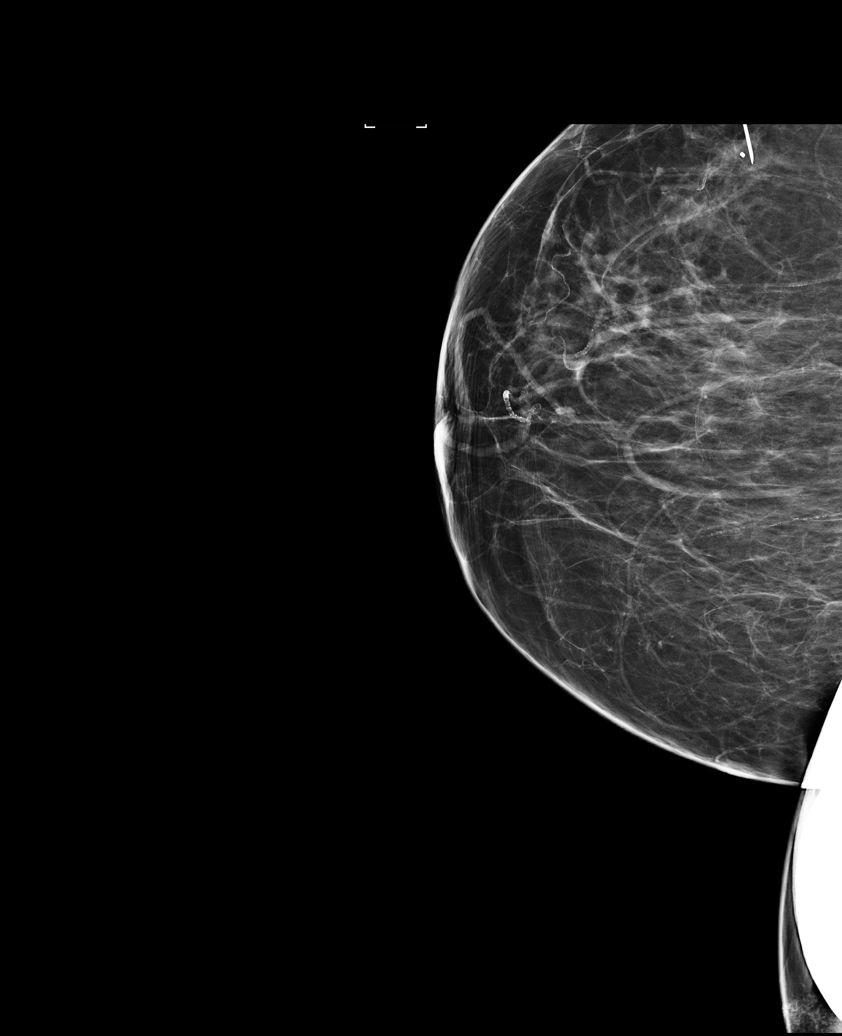

[R ML (3 of 3)]
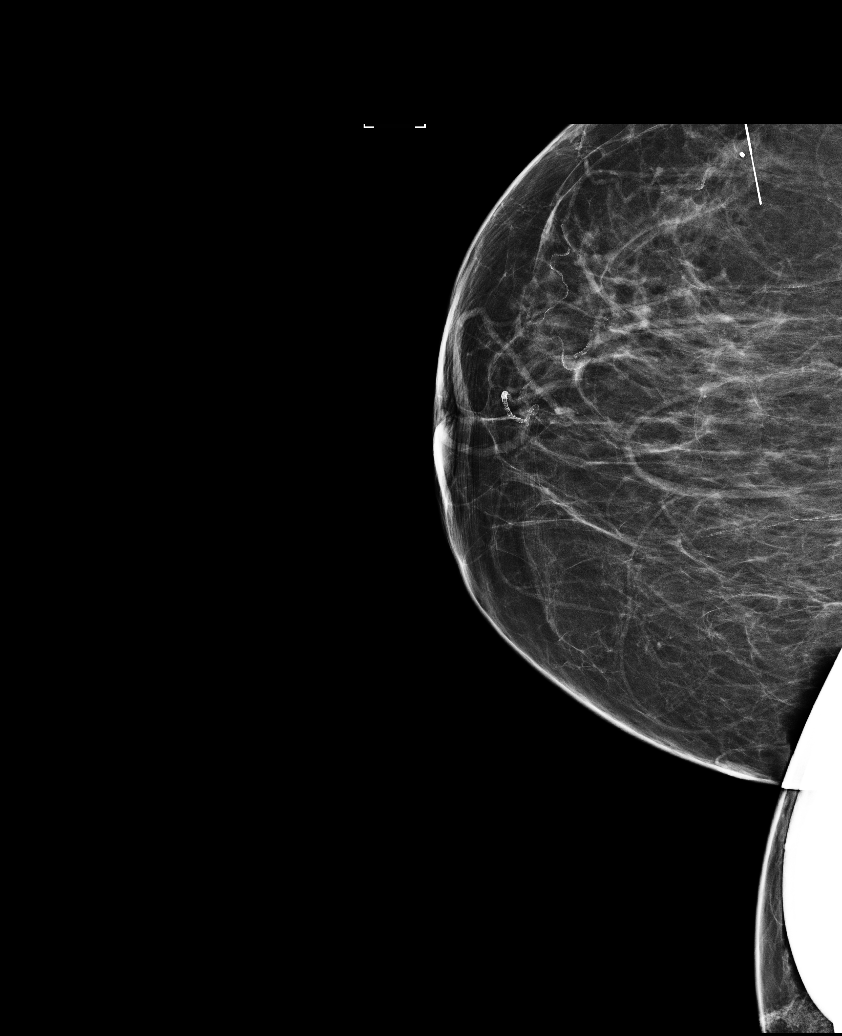

[R CC (1 of 2)]
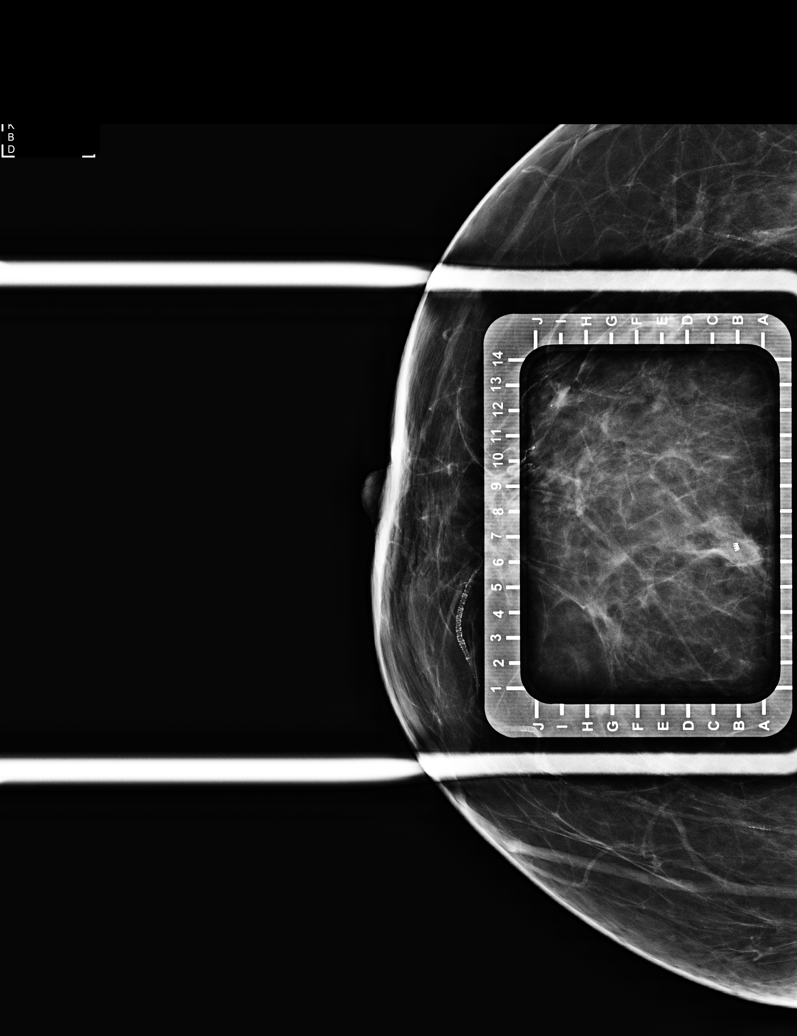

[R CC (2 of 2)]
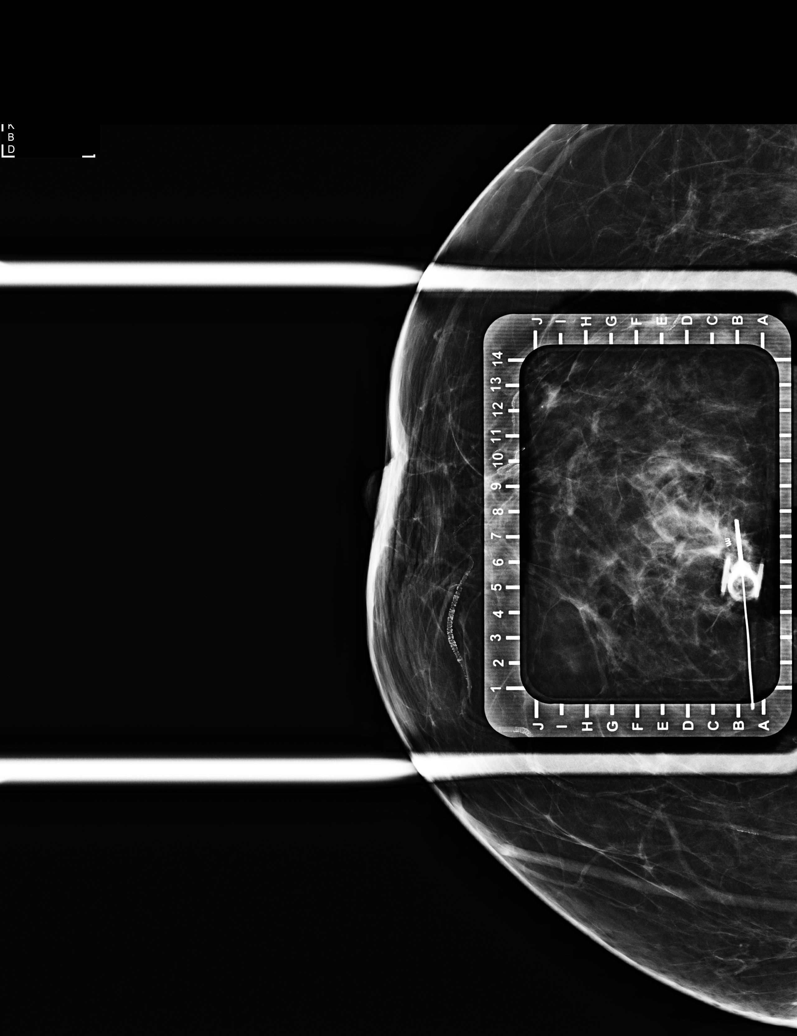

[5 of 5 positions shown; findings below may reference images not displayed]

FINDINGS: Patient presents for needle localization prior to surgical excision.
I met with the patient and we discussed the procedure of needle
localization including benefits and alternatives. We discussed the
high likelihood of a successful procedure. We discussed the risks of
the procedure, including infection, bleeding, tissue injury, and
further surgery. Informed, written consent was given. The usual
time-out protocol was performed immediately prior to the procedure.

Using mammographic guidance, sterile technique, 1% lidocaine and a 7
cm modified Kopans needle, the biopsy clip in the upper right breast
localized using cranial to caudal approach. The images were marked
for Dr. Baza.
IMPRESSION: Needle localization the right breast. No apparent complications.

## 2017-05-24 DIAGNOSIS — E538 Deficiency of other specified B group vitamins: Secondary | ICD-10-CM | POA: Diagnosis not present

## 2017-05-26 ENCOUNTER — Ambulatory Visit
Admission: RE | Admit: 2017-05-26 | Discharge: 2017-05-26 | Disposition: A | Discharge status: Home / Self Care | Payer: Medicare HMO | Source: Ambulatory Visit | Attending: Internal Medicine | Admitting: Internal Medicine

## 2017-05-26 DIAGNOSIS — C50919 Malignant neoplasm of unspecified site of unspecified female breast: Secondary | ICD-10-CM

## 2017-05-26 DIAGNOSIS — C50911 Malignant neoplasm of unspecified site of right female breast: Secondary | ICD-10-CM | POA: Insufficient documentation

## 2017-05-26 DIAGNOSIS — R928 Other abnormal and inconclusive findings on diagnostic imaging of breast: Secondary | ICD-10-CM | POA: Diagnosis not present

## 2017-05-26 HISTORY — DX: Malignant neoplasm of unspecified site of unspecified female breast: C50.919

## 2017-05-28 NOTE — Progress Notes (Signed)
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  INTERVAL HISTORY: Patient returns to clinic today for routine 33-monthevaluation.  She is tolerating letrozole well without significant side effects. She continues to feel well and is asymptomatic. She has no neurologic complaints. She denies any recent fevers or illnesses. She has a good appetite and denies weight loss. She has no chest pain or shortness of breath. She denies any nausea, vomiting, constipation, or diarrhea. She has no urinary complaints. Patient offers no specific complaints today.  REVIEW OF SYSTEMS:   Review of Systems  Constitutional: Negative.  Negative for fever, malaise/fatigue and weight loss.  Respiratory: Negative.  Negative for cough.   Cardiovascular: Negative.  Negative for chest pain and leg swelling.  Gastrointestinal: Negative.  Negative for abdominal pain.  Genitourinary: Negative.   Musculoskeletal: Negative.   Neurological: Negative for sensory change and weakness.  Psychiatric/Behavioral: Negative.  The patient is not nervous/anxious.     As per HPI. Otherwise, a complete review of systems is negative.  PAST MEDICAL HISTORY: Past Medical History:  Diagnosis Date  . Anemia    is followed by renal doctor and pcp  . Arthritis   . Breast cancer ([place] 04/2016   Rt. breast ca, f/u with radiation  . Cancer ([facility] 1961   uterine cancer  . Coronary artery disease   . Dyspnea   . GERD (gastroesophageal reflux disease)   . Hypertension   . Kidney disease    stage 4.  followed by dr. kSedonia Small . Lower extremity edema   . S/P lumpectomy, right breast    having done on 04/26/16  . Sleep apnea    uses cpap    PAST SURGICAL HISTORY: Past Surgical History:  Procedure Laterality Date  . BREAST BIOPSY Right 03/2016   invasive mammary carcinoma  . BREAST LUMPECTOMY Right    04/26/16, f/u with radiation  . CAROTID STENT Left 2016   was having pain down neck. carotid artery clogged. dr. sDelana Meyerput stent in  . CAROTID STENT Left   . CATARACT EXTRACTION W/ INTRAOCULAR LENS  IMPLANT, BILATERAL Bilateral 2009  . EYE SURGERY  2009   cataracts  . PARTIAL HYSTERECTOMY  1961   uterus only removed  . PARTIAL MASTECTOMY WITH NEEDLE LOCALIZATION Right 04/26/2016   Procedure: PARTIAL MASTECTOMY WITH NEEDLE LOCALIZATION;  Surgeon: Rachel Green MD;  Location: [facility] ORS;  Service: General;  Laterality: Right;  . SENTINEL NODE BIOPSY Right 04/26/2016   Procedure: SENTINEL NODE BIOPSY;  Surgeon: Rachel Green MD;  Location: [facility] ORS;  Service: General;  Laterality: Right;    FAMILY HISTORY: Family History  Problem Relation Age of Onset  . Bladder Cancer Neg Hx   . Kidney cancer Neg Hx   . Prolactinoma Neg Hx   . Prostate cancer Neg Hx     ADVANCED DIRECTIVES (Y/N):  N  HEALTH MAINTENANCE: Social History   Tobacco Use  . Smoking status: Never Smoker  . Smokeless tobacco: Never Used  Substance Use Topics  . Alcohol use: No  . Drug use: No     Colonoscopy:  PAP:  Bone density:  Lipid panel:  Allergies  Allergen Reactions  . Codeine Nausea Only  Intolerance instead of true allerg  . Sulfa Antibiotics Swelling and Rash    swelling    Current Outpatient Medications  Medication Sig Dispense Refill  . acetaminophen (TYLENOL) 500 MG tablet Take 1,000 mg by mouth daily. May take an additional 1000 mgs as needed for pain    . aspirin EC 81 MG tablet Take 81 mg by mouth at bedtime.     . calcium-vitamin D (OSCAL WITH D) 500-200 MG-UNIT tablet Take 1 tablet by mouth 2 (two) times daily. 180 tablet 3  . Cholecalciferol (VITAMIN D3) 1000 units CAPS Take 1,000 Units by  mouth daily.     . ciprofloxacin (CIPRO) 250 MG tablet Take 1 tablet (250 mg total) by mouth 2 (two) times daily. 14 tablet 0  . clopidogrel (PLAVIX) 75 MG tablet TAKE 1 TABLET EVERY DAY 90 tablet 3  . Cranberry 500 MG TABS Take by mouth 2 (two) times daily.    . Cyanocobalamin (VITAMIN B-12 IJ) Inject 1,000 mcg as directed every 30 (thirty) days.    Marland Kitchen docusate sodium (STOOL SOFTENER) 100 MG capsule Take 100 mg by mouth 2 (two) times daily.    Marland Kitchen esomeprazole (NEXIUM) 40 MG capsule Take 40 mg by mouth daily.    . ferrous gluconate (FERGON) 324 MG tablet Take 324 mg by mouth daily.    . furosemide (LASIX) 20 MG tablet Take 20 mg by mouth daily as needed for edema.     Marland Kitchen letrozole (FEMARA) 2.5 MG tablet Take 1 tablet (2.5 mg total) by mouth daily. 90 tablet 3  . losartan (COZAAR) 50 MG tablet Take 50 mg by mouth daily.    Marland Kitchen lovastatin (MEVACOR) 40 MG tablet Take 40 mg by mouth 2 (two) times daily.    . metoprolol tartrate (LOPRESSOR) 25 MG tablet Take 25 mg by mouth 2 (two) times daily.    . Probiotic Product (PROBIOTIC ADVANCED) CAPS Take by mouth.     No current facility-administered medications for this visit.     OBJECTIVE: Vitals:   05/31/17 1443  BP: (!) 162/84  Pulse: 63  Resp: 18  Temp: 98.3 F (36.8 C)     Body mass index is 36.98 kg/m.    ECOG FS:0 - Asymptomatic  General: Well-developed, well-nourished, no acute distress. Eyes: Pink conjunctiva, anicteric sclera. Breasts: Well-healed surgical scar on right breast. Lungs: Clear to auscultation bilaterally. Heart: Regular rate and rhythm. No rubs, murmurs, or gallops. Abdomen: Soft, nontender, nondistended. No organomegaly noted, normoactive bowel sounds. Musculoskeletal: No edema, cyanosis, or clubbing. Neuro: Alert, answering all questions appropriately. Cranial nerves grossly intact. Skin: No rashes or petechiae noted. Psych: Normal affect.   LAB RESULTS:  Lab Results  Component Value Date   NA 138 07/28/2016    K 4.8 07/28/2016   CL 106 07/28/2016   CO2 22 07/28/2016   GLUCOSE 96 07/28/2016   BUN 39 (H) 07/28/2016   CREATININE 1.74 (H) 07/28/2016   CALCIUM 8.9 07/28/2016   GFRNONAA 27 (L) 07/28/2016   GFRAA 31 (L) 07/28/2016    Lab Results  Component Value Date   WBC 6.7 07/13/2016   NEUTROABS 5.0 05/28/2014   HGB 12.2 07/13/2016   HCT 36.9 07/13/2016   MCV 92.9 07/13/2016   PLT 178 07/13/2016     STUDIES: Mm Diag Breast Tomo Bilateral  Result Date: 05/26/2017 CLINICAL DATA:  Routine annual surveillance status post right breast lumpectomy in December of 2017. EXAM: 2D DIGITAL DIAGNOSTIC BILATERAL MAMMOGRAM WITH CAD AND ADJUNCT TOMO COMPARISON:  Previous exam(s). ACR Breast Density Category b: There are scattered areas of fibroglandular density. FINDINGS: Expected surgical changes identified in the superior right breast consistent with history of lumpectomy. No suspicious calcifications, masses or areas of distortion are seen in the bilateral breasts. Mammographic images were processed with CAD. IMPRESSION: Expected surgical changes status post right breast lumpectomy in December of 2017. No mammographic evidence of malignancy in the bilateral breast. RECOMMENDATION: Diagnostic mammogram is suggested in 1 year. (Code:DM-B-01Y) I have discussed the findings and recommendations with the patient. Results were also provided in writing at the conclusion of the visit. If applicable, a reminder letter will be sent to the patient regarding the next appointment. BI-RADS CATEGORY  2: Benign. Electronically Signed   By: Ammie Ferrier M.D.   On: 05/26/2017 11:41    ASSESSMENT: Pathologic stage IA ER positive, PR HER-2 negative invasive carcinoma of the upper inner quadrant of right breast. High-risk Mammoprint.  PLAN:    1. Pathologic stage IA ER positive, PR HER-2 negative invasive carcinoma of the upper inner quadrant of right breast: Final pathology was reviewed independently. Although patient was  noted to have a high risk Mammoprint, it was determined given her advanced age as well as multiple comorbidities that chemotherapy was not in her best interest.  Her most recent mammogram on May 26, 2017 was reported as BI-RADS 2.  Repeat in January 2020.  Continue letrozole for a total 5 years completing in March 2023. Return to clinic in 6 months for further evaluation. 2.  Osteoporosis: Patient's bone marrow density in March 2018 reported T score of -2.5.  She is no longer taking Fosamax, but this may need to be reinitiated in the near future.  Continue calcium and vitamin D supplementation.  Repeat in March 2019.   Approximately 20 minutes was spent in discussion of which greater than 50% was consultation.  Patient expressed understanding and was in agreement with this plan. She also understands that She can call clinic at any time with any questions, concerns, or complaints.   Cancer Staging Primary cancer of upper inner quadrant of right female breast [facility]) Staging form: Breast, AJCC 7th Edition - Pathologic stage from 05/15/2016: Stage IA (T1c, N0, cM0) - Signed by Lloyd Huger, MD on 05/15/2016   Lloyd Huger, MD   05/31/2017 2:47 PM

## 2017-05-31 ENCOUNTER — Other Ambulatory Visit: Payer: Self-pay

## 2017-05-31 ENCOUNTER — Encounter: Payer: Self-pay | Admitting: Oncology

## 2017-05-31 ENCOUNTER — Inpatient Hospital Stay: Payer: Medicare HMO | Attending: Oncology | Admitting: Oncology

## 2017-05-31 VITALS — BP 162/84 | HR 63 | Temp 98.3°F | Resp 18 | Wt 202.2 lb

## 2017-05-31 DIAGNOSIS — M810 Age-related osteoporosis without current pathological fracture: Secondary | ICD-10-CM | POA: Diagnosis not present

## 2017-05-31 DIAGNOSIS — G4730 Sleep apnea, unspecified: Secondary | ICD-10-CM | POA: Diagnosis not present

## 2017-05-31 DIAGNOSIS — Z923 Personal history of irradiation: Secondary | ICD-10-CM | POA: Diagnosis not present

## 2017-05-31 DIAGNOSIS — R609 Edema, unspecified: Secondary | ICD-10-CM | POA: Insufficient documentation

## 2017-05-31 DIAGNOSIS — M129 Arthropathy, unspecified: Secondary | ICD-10-CM | POA: Diagnosis not present

## 2017-05-31 DIAGNOSIS — N184 Chronic kidney disease, stage 4 (severe): Secondary | ICD-10-CM | POA: Insufficient documentation

## 2017-05-31 DIAGNOSIS — Z170 Estrogen receptor positive status [ER+]: Secondary | ICD-10-CM

## 2017-05-31 DIAGNOSIS — C50211 Malignant neoplasm of upper-inner quadrant of right female breast: Secondary | ICD-10-CM | POA: Insufficient documentation

## 2017-05-31 DIAGNOSIS — R0602 Shortness of breath: Secondary | ICD-10-CM | POA: Diagnosis not present

## 2017-05-31 DIAGNOSIS — K219 Gastro-esophageal reflux disease without esophagitis: Secondary | ICD-10-CM | POA: Insufficient documentation

## 2017-05-31 DIAGNOSIS — Z79811 Long term (current) use of aromatase inhibitors: Secondary | ICD-10-CM

## 2017-05-31 DIAGNOSIS — Z7982 Long term (current) use of aspirin: Secondary | ICD-10-CM | POA: Diagnosis not present

## 2017-05-31 DIAGNOSIS — Z79899 Other long term (current) drug therapy: Secondary | ICD-10-CM | POA: Diagnosis not present

## 2017-05-31 DIAGNOSIS — I129 Hypertensive chronic kidney disease with stage 1 through stage 4 chronic kidney disease, or unspecified chronic kidney disease: Secondary | ICD-10-CM | POA: Insufficient documentation

## 2017-05-31 DIAGNOSIS — Z9011 Acquired absence of right breast and nipple: Secondary | ICD-10-CM | POA: Insufficient documentation

## 2017-05-31 DIAGNOSIS — I2510 Atherosclerotic heart disease of native coronary artery without angina pectoris: Secondary | ICD-10-CM | POA: Diagnosis not present

## 2017-05-31 DIAGNOSIS — Z8542 Personal history of malignant neoplasm of other parts of uterus: Secondary | ICD-10-CM

## 2017-05-31 DIAGNOSIS — D649 Anemia, unspecified: Secondary | ICD-10-CM | POA: Insufficient documentation

## 2017-05-31 NOTE — Progress Notes (Signed)
Patient denies any concerns today.  

## 2017-06-28 DIAGNOSIS — E538 Deficiency of other specified B group vitamins: Secondary | ICD-10-CM | POA: Diagnosis not present

## 2017-07-06 DIAGNOSIS — Z1211 Encounter for screening for malignant neoplasm of colon: Secondary | ICD-10-CM | POA: Diagnosis not present

## 2017-07-24 DIAGNOSIS — M069 Rheumatoid arthritis, unspecified: Secondary | ICD-10-CM | POA: Diagnosis not present

## 2017-07-24 DIAGNOSIS — C539 Malignant neoplasm of cervix uteri, unspecified: Secondary | ICD-10-CM | POA: Diagnosis not present

## 2017-07-24 DIAGNOSIS — I10 Essential (primary) hypertension: Secondary | ICD-10-CM | POA: Diagnosis not present

## 2017-07-24 DIAGNOSIS — E782 Mixed hyperlipidemia: Secondary | ICD-10-CM | POA: Diagnosis not present

## 2017-07-24 DIAGNOSIS — Z87440 Personal history of urinary (tract) infections: Secondary | ICD-10-CM | POA: Diagnosis not present

## 2017-07-24 DIAGNOSIS — N184 Chronic kidney disease, stage 4 (severe): Secondary | ICD-10-CM | POA: Diagnosis not present

## 2017-07-31 DIAGNOSIS — E538 Deficiency of other specified B group vitamins: Secondary | ICD-10-CM | POA: Diagnosis not present

## 2017-08-07 ENCOUNTER — Other Ambulatory Visit: Payer: Self-pay | Admitting: Oncology

## 2017-08-07 DIAGNOSIS — C50211 Malignant neoplasm of upper-inner quadrant of right female breast: Secondary | ICD-10-CM

## 2017-08-28 DIAGNOSIS — R809 Proteinuria, unspecified: Secondary | ICD-10-CM | POA: Diagnosis not present

## 2017-08-28 DIAGNOSIS — N184 Chronic kidney disease, stage 4 (severe): Secondary | ICD-10-CM | POA: Diagnosis not present

## 2017-08-28 DIAGNOSIS — N390 Urinary tract infection, site not specified: Secondary | ICD-10-CM | POA: Diagnosis not present

## 2017-08-28 DIAGNOSIS — E875 Hyperkalemia: Secondary | ICD-10-CM | POA: Diagnosis not present

## 2017-08-28 DIAGNOSIS — I129 Hypertensive chronic kidney disease with stage 1 through stage 4 chronic kidney disease, or unspecified chronic kidney disease: Secondary | ICD-10-CM | POA: Diagnosis not present

## 2017-08-31 DIAGNOSIS — E538 Deficiency of other specified B group vitamins: Secondary | ICD-10-CM | POA: Diagnosis not present

## 2017-09-14 DIAGNOSIS — R0789 Other chest pain: Secondary | ICD-10-CM | POA: Diagnosis not present

## 2017-09-14 DIAGNOSIS — E782 Mixed hyperlipidemia: Secondary | ICD-10-CM | POA: Diagnosis not present

## 2017-09-14 DIAGNOSIS — I779 Disorder of arteries and arterioles, unspecified: Secondary | ICD-10-CM | POA: Diagnosis not present

## 2017-09-14 DIAGNOSIS — N184 Chronic kidney disease, stage 4 (severe): Secondary | ICD-10-CM | POA: Diagnosis not present

## 2017-09-14 DIAGNOSIS — I6522 Occlusion and stenosis of left carotid artery: Secondary | ICD-10-CM | POA: Diagnosis not present

## 2017-09-14 DIAGNOSIS — R0602 Shortness of breath: Secondary | ICD-10-CM | POA: Diagnosis not present

## 2017-09-14 DIAGNOSIS — I10 Essential (primary) hypertension: Secondary | ICD-10-CM | POA: Diagnosis not present

## 2017-09-19 DIAGNOSIS — H34831 Tributary (branch) retinal vein occlusion, right eye, with macular edema: Secondary | ICD-10-CM | POA: Diagnosis not present

## 2017-09-20 ENCOUNTER — Encounter: Payer: Self-pay | Admitting: *Deleted

## 2017-09-26 DIAGNOSIS — R0602 Shortness of breath: Secondary | ICD-10-CM | POA: Diagnosis not present

## 2017-10-02 DIAGNOSIS — E538 Deficiency of other specified B group vitamins: Secondary | ICD-10-CM | POA: Diagnosis not present

## 2017-10-09 DIAGNOSIS — H34831 Tributary (branch) retinal vein occlusion, right eye, with macular edema: Secondary | ICD-10-CM | POA: Diagnosis not present

## 2017-10-09 DIAGNOSIS — H26491 Other secondary cataract, right eye: Secondary | ICD-10-CM | POA: Diagnosis not present

## 2017-10-23 DIAGNOSIS — E782 Mixed hyperlipidemia: Secondary | ICD-10-CM | POA: Diagnosis not present

## 2017-10-23 DIAGNOSIS — Z79899 Other long term (current) drug therapy: Secondary | ICD-10-CM | POA: Diagnosis not present

## 2017-10-23 DIAGNOSIS — E538 Deficiency of other specified B group vitamins: Secondary | ICD-10-CM | POA: Diagnosis not present

## 2017-10-23 DIAGNOSIS — R8290 Unspecified abnormal findings in urine: Secondary | ICD-10-CM | POA: Diagnosis not present

## 2017-10-23 DIAGNOSIS — I10 Essential (primary) hypertension: Secondary | ICD-10-CM | POA: Diagnosis not present

## 2017-10-23 DIAGNOSIS — N184 Chronic kidney disease, stage 4 (severe): Secondary | ICD-10-CM | POA: Diagnosis not present

## 2017-11-02 DIAGNOSIS — E538 Deficiency of other specified B group vitamins: Secondary | ICD-10-CM | POA: Diagnosis not present

## 2017-11-03 ENCOUNTER — Other Ambulatory Visit: Payer: Self-pay | Admitting: Oncology

## 2017-11-07 DIAGNOSIS — H34831 Tributary (branch) retinal vein occlusion, right eye, with macular edema: Secondary | ICD-10-CM | POA: Diagnosis not present

## 2017-11-14 DIAGNOSIS — Z853 Personal history of malignant neoplasm of breast: Secondary | ICD-10-CM | POA: Diagnosis not present

## 2017-11-26 NOTE — Progress Notes (Signed)
[place]  [phone] [phone]) [phone]  ID: Rachel Jones OB: [birth year]  MR#: [id number]  ZYS#:[id number]  Patient Care Team: Idelle Crouch, MD as PCP - General (Internal Medicine)  CHIEF COMPLAINT: Pathologic stage IA ER positive, PR, HER-2 negative invasive carcinoma of the upper inner quadrant of right breast.  INTERVAL HISTORY: Patient returns to clinic today for routine six-month evaluation.  She continues to tolerate letrozole well without significant side effects.  She currently feels well and is asymptomatic.  She has no neurologic complaints. She denies any recent fevers or illnesses. She has a good appetite and denies weight loss. She has no chest pain or shortness of breath. She denies any nausea, vomiting, constipation, or diarrhea. She has no urinary complaints.  Patient feels at her baseline offers no specific complaints today.  REVIEW OF SYSTEMS:   Review of Systems  Constitutional: Negative.  Negative for fever, malaise/fatigue and weight loss.  Respiratory: Negative.  Negative for cough.   Cardiovascular: Negative.  Negative for chest pain and leg swelling.  Gastrointestinal: Negative.  Negative for abdominal pain and constipation.  Genitourinary: Negative.  Negative for dysuria.  Musculoskeletal: Negative.  Negative for myalgias.  Skin: Negative.  Negative for rash.  Neurological: Negative.  Negative for sensory change, focal weakness, weakness and headaches.  Psychiatric/Behavioral: Negative.  The patient is not nervous/anxious.     As per HPI. Otherwise, a complete review of systems is negative.  PAST MEDICAL HISTORY: Past Medical History:  Diagnosis Date  . Anemia    is followed by renal doctor and pcp  . Arthritis   . Breast cancer ([place]) 04/2016   Rt. breast ca, f/u with radiation  . Cancer [facility]) 1961   uterine cancer  . Coronary artery disease   . Dyspnea   . GERD (gastroesophageal reflux disease)   .  Hypertension   . Kidney disease    stage 4.  followed by dr. Sedonia Small  . Lower extremity edema   . S/P lumpectomy, right breast    having done on 04/26/16  . Sleep apnea    uses cpap    PAST SURGICAL HISTORY: Past Surgical History:  Procedure Laterality Date  . BREAST BIOPSY Right 03/2016   invasive mammary carcinoma  . BREAST LUMPECTOMY Right    04/26/16, f/u with radiation  . CAROTID STENT Left 2016   was having pain down neck. carotid artery clogged. dr. Delana Meyer put stent in  . CAROTID STENT Left   . CATARACT EXTRACTION W/ INTRAOCULAR LENS  IMPLANT, BILATERAL Bilateral 2009  . EYE SURGERY  2009   cataracts  . PARTIAL HYSTERECTOMY  1961   uterus only removed  . PARTIAL MASTECTOMY WITH NEEDLE LOCALIZATION Right 04/26/2016   Procedure: PARTIAL MASTECTOMY WITH NEEDLE LOCALIZATION;  Surgeon: Leonie Green, MD;  Location: [facility] ORS;  Service: General;  Laterality: Right;  . SENTINEL NODE BIOPSY Right 04/26/2016   Procedure: SENTINEL NODE BIOPSY;  Surgeon: Leonie Green, MD;  Location: [facility] ORS;  Service: General;  Laterality: Right;    FAMILY HISTORY: Family History  Problem Relation Age of Onset  . Bladder Cancer Neg Hx   . Kidney cancer Neg Hx   . Prolactinoma Neg Hx   . Prostate cancer Neg Hx     ADVANCED DIRECTIVES (Y/N):  N  HEALTH MAINTENANCE: Social History   Tobacco Use  . Smoking status: Never Smoker  . Smokeless tobacco: Never Used  Substance Use Topics  . Alcohol use: No  .  Drug use: No     Colonoscopy:  PAP:  Bone density:  Lipid panel:  Allergies  Allergen Reactions  . Codeine Nausea Only    Intolerance instead of true allerg  . Sulfa Antibiotics Swelling and Rash    swelling    Current Outpatient Medications  Medication Sig Dispense Refill  . acetaminophen (TYLENOL) 500 MG tablet Take 1,000 mg by mouth daily. May take an additional 1000 mgs as needed for pain    . aspirin EC 81 MG tablet Take 81 mg by mouth at bedtime.     .  bevacizumab (AVASTIN) 400 MG/16ML SOLN Inject into the vein.    . calcium-vitamin D (OSCAL WITH D) 500-200 MG-UNIT tablet Take 1 tablet by mouth 2 (two) times daily. 180 tablet 3  . calcium-vitamin D (OSCAL WITH D) 500-200 MG-UNIT TABS tablet TAKE 1 TABLET TWICE DAILY 180 tablet 3  . Cholecalciferol (VITAMIN D3) 1000 units CAPS Take 1,000 Units by mouth daily.     . ciprofloxacin (CIPRO) 250 MG tablet Take 1 tablet (250 mg total) by mouth 2 (two) times daily. 14 tablet 0  . clopidogrel (PLAVIX) 75 MG tablet TAKE 1 TABLET EVERY DAY 90 tablet 3  . Cranberry 500 MG TABS Take by mouth 2 (two) times daily.    . Cyanocobalamin (VITAMIN B-12 IJ) Inject 1,000 mcg as directed every 30 (thirty) days.    Marland Kitchen docusate sodium (STOOL SOFTENER) 100 MG capsule Take 100 mg by mouth 2 (two) times daily.    Marland Kitchen esomeprazole (NEXIUM) 40 MG capsule Take 40 mg by mouth daily.    . ferrous gluconate (FERGON) 324 MG tablet Take 324 mg by mouth daily.    . furosemide (LASIX) 20 MG tablet Take 20 mg by mouth daily as needed for edema.     Marland Kitchen letrozole (FEMARA) 2.5 MG tablet TAKE 1 TABLET EVERY DAY 90 tablet 3  . losartan (COZAAR) 50 MG tablet Take 50 mg by mouth daily.    Marland Kitchen lovastatin (MEVACOR) 40 MG tablet Take 40 mg by mouth 2 (two) times daily.    . metoprolol tartrate (LOPRESSOR) 25 MG tablet Take 25 mg by mouth 2 (two) times daily.    . Probiotic Product (PROBIOTIC ADVANCED) CAPS Take by mouth.     No current facility-administered medications for this visit.     OBJECTIVE: Vitals:   11/29/17 1522 11/29/17 1532  BP:  134/78  Pulse: 70   Resp:    Temp:       Body mass index is 35.46 kg/m.    ECOG FS:0 - Asymptomatic  General: Well-developed, well-nourished, no acute distress. Eyes: Pink conjunctiva, anicteric sclera. Breast: Bilateral breast and axilla without lumps or masses. Lungs: Clear to auscultation bilaterally. Heart: Regular rate and rhythm. No rubs, murmurs, or gallops. Abdomen: Soft, nontender,  nondistended. No organomegaly noted, normoactive bowel sounds. Musculoskeletal: No edema, cyanosis, or clubbing. Neuro: Alert, answering all questions appropriately. Cranial nerves grossly intact. Skin: No rashes or petechiae noted. Psych: Normal affect.   LAB RESULTS:  Lab Results  Component Value Date   NA 138 07/28/2016   K 4.8 07/28/2016   CL 106 07/28/2016   CO2 22 07/28/2016   GLUCOSE 96 07/28/2016   BUN 39 (H) 07/28/2016   CREATININE 1.74 (H) 07/28/2016   CALCIUM 8.9 07/28/2016   GFRNONAA 27 (L) 07/28/2016   GFRAA 31 (L) 07/28/2016    Lab Results  Component Value Date   WBC 6.7 07/13/2016   NEUTROABS 5.0 05/28/2014  HGB 12.2 07/13/2016   HCT 36.9 07/13/2016   MCV 92.9 07/13/2016   PLT 178 07/13/2016     STUDIES: No results found.  ASSESSMENT: Pathologic stage IA ER positive, PR HER-2 negative invasive carcinoma of the upper inner quadrant of right breast. High-risk Mammoprint.  PLAN:    1. Pathologic stage IA ER positive, PR HER-2 negative invasive carcinoma of the upper inner quadrant of right breast: Final pathology was reviewed independently. Although patient was noted to have a high risk Mammoprint, it was determined given her advanced age as well as multiple comorbidities that chemotherapy was not in her best interest.  Patient's most recent mammogram on May 26, 2017 was reported as BI-RADS 2, repeat in January 2020.  Continue letrozole for a total of 5 years completing treatment in March 2023.  Return to clinic in 6 months for routine evaluation.  2.  Osteoporosis: Patient's bone marrow density in March 2018 reported T score of -2.5.  She is no longer taking Fosamax, but this may need to be reinitiated in the near future.  Patient did not go to her scheduled DEXA scan this past March, therefore will reschedule in the next 1 to 2 weeks.    Patient expressed understanding and was in agreement with this plan. She also understands that She can call clinic at  any time with any questions, concerns, or complaints.   Cancer Staging Primary cancer of upper inner quadrant of right female breast [facility]) Staging form: Breast, AJCC 7th Edition - Pathologic stage from 05/15/2016: Stage IA (T1c, N0, cM0) - Signed by Lloyd Huger, MD on 05/15/2016   Lloyd Huger, MD   12/03/2017 7:05 AM

## 2017-11-29 ENCOUNTER — Encounter: Payer: Self-pay | Admitting: Oncology

## 2017-11-29 ENCOUNTER — Inpatient Hospital Stay: Payer: Medicare HMO | Attending: Oncology | Admitting: Oncology

## 2017-11-29 VITALS — BP 134/78 | HR 70 | Temp 98.6°F | Resp 18 | Wt 193.9 lb

## 2017-11-29 DIAGNOSIS — Z170 Estrogen receptor positive status [ER+]: Secondary | ICD-10-CM | POA: Diagnosis not present

## 2017-11-29 DIAGNOSIS — G4730 Sleep apnea, unspecified: Secondary | ICD-10-CM | POA: Diagnosis not present

## 2017-11-29 DIAGNOSIS — N184 Chronic kidney disease, stage 4 (severe): Secondary | ICD-10-CM | POA: Insufficient documentation

## 2017-11-29 DIAGNOSIS — Z79811 Long term (current) use of aromatase inhibitors: Secondary | ICD-10-CM | POA: Diagnosis not present

## 2017-11-29 DIAGNOSIS — Z79899 Other long term (current) drug therapy: Secondary | ICD-10-CM | POA: Insufficient documentation

## 2017-11-29 DIAGNOSIS — I129 Hypertensive chronic kidney disease with stage 1 through stage 4 chronic kidney disease, or unspecified chronic kidney disease: Secondary | ICD-10-CM | POA: Diagnosis not present

## 2017-11-29 DIAGNOSIS — Z7982 Long term (current) use of aspirin: Secondary | ICD-10-CM | POA: Insufficient documentation

## 2017-11-29 DIAGNOSIS — D649 Anemia, unspecified: Secondary | ICD-10-CM | POA: Diagnosis not present

## 2017-11-29 DIAGNOSIS — Z8542 Personal history of malignant neoplasm of other parts of uterus: Secondary | ICD-10-CM | POA: Insufficient documentation

## 2017-11-29 DIAGNOSIS — Z923 Personal history of irradiation: Secondary | ICD-10-CM

## 2017-11-29 DIAGNOSIS — C50211 Malignant neoplasm of upper-inner quadrant of right female breast: Secondary | ICD-10-CM | POA: Insufficient documentation

## 2017-11-29 DIAGNOSIS — I2510 Atherosclerotic heart disease of native coronary artery without angina pectoris: Secondary | ICD-10-CM | POA: Diagnosis not present

## 2017-11-29 DIAGNOSIS — M810 Age-related osteoporosis without current pathological fracture: Secondary | ICD-10-CM | POA: Diagnosis not present

## 2017-11-29 NOTE — Progress Notes (Signed)
Patient denies any concerns today.  

## 2017-12-05 DIAGNOSIS — E538 Deficiency of other specified B group vitamins: Secondary | ICD-10-CM | POA: Diagnosis not present

## 2017-12-11 DIAGNOSIS — H26491 Other secondary cataract, right eye: Secondary | ICD-10-CM | POA: Diagnosis not present

## 2017-12-12 DIAGNOSIS — H26491 Other secondary cataract, right eye: Secondary | ICD-10-CM | POA: Diagnosis not present

## 2017-12-14 ENCOUNTER — Ambulatory Visit
Admission: RE | Admit: 2017-12-14 | Discharge: 2017-12-14 | Disposition: A | Discharge status: Home / Self Care | Payer: Medicare HMO | Source: Ambulatory Visit | Attending: Oncology | Admitting: Oncology

## 2017-12-14 DIAGNOSIS — C50211 Malignant neoplasm of upper-inner quadrant of right female breast: Secondary | ICD-10-CM | POA: Diagnosis not present

## 2017-12-14 DIAGNOSIS — M85852 Other specified disorders of bone density and structure, left thigh: Secondary | ICD-10-CM | POA: Diagnosis not present

## 2017-12-14 DIAGNOSIS — M810 Age-related osteoporosis without current pathological fracture: Secondary | ICD-10-CM | POA: Diagnosis not present

## 2017-12-14 DIAGNOSIS — Z79811 Long term (current) use of aromatase inhibitors: Secondary | ICD-10-CM | POA: Diagnosis not present

## 2017-12-14 DIAGNOSIS — Z780 Asymptomatic menopausal state: Secondary | ICD-10-CM | POA: Diagnosis not present

## 2017-12-18 ENCOUNTER — Other Ambulatory Visit: Payer: Self-pay | Admitting: Oncology

## 2017-12-18 MED ORDER — ALENDRONATE SODIUM 70 MG PO TABS: 70.0000 mg | ORAL_TABLET | ORAL | 0 refills | Status: DC

## 2017-12-18 NOTE — Progress Notes (Signed)
Per inbox from Dr. Grayland Ormond, patient to restart Fosamax 70 mg weekly.  Most recent T score is -3.0.  Previously -2.5.  Prescription called in to [facility].  Patient states she was previously taken off of Fosamax by her "kidney doctor".  Unable to identify a nephrologist.   Faythe Casa, NP 12/18/2017 11:48 AM

## 2017-12-19 ENCOUNTER — Telehealth: Payer: Self-pay | Admitting: Oncology

## 2017-12-19 NOTE — Telephone Encounter (Signed)
Spoke with Dr. Myrtie Cruise regarding patients kidney function and discontinuing Fosamax and he recommends the following:  "Unfortunately, patient's GFR is below 35 and we do not recommend alendronate for patient's with low GFR. Patient also has an elevated PTH. The studies are limited. We can try vitamin D agents such as calcitriol or an agent such as teriparatide or adaloparatide. (I have never tried an anabolic agent myself.) If patient does decide to proceed with a bisphosphonate, I have used risedronate 35mg  every other week - half the regular dose".   Will speak with Dr. Grayland Ormond next week when he returns to clinic and he will make a decision based on Dr. Assunta Gambles recommendations.  Patient in agreement with plan.   Canceled prescription for Fosamax at this time.  Faythe Casa, NP 12/19/2017 11:25 AM

## 2017-12-25 DIAGNOSIS — H34831 Tributary (branch) retinal vein occlusion, right eye, with macular edema: Secondary | ICD-10-CM | POA: Diagnosis not present

## 2017-12-26 ENCOUNTER — Telehealth: Payer: Self-pay | Admitting: *Deleted

## 2017-12-26 ENCOUNTER — Other Ambulatory Visit: Payer: Self-pay | Admitting: Oncology

## 2017-12-26 MED ORDER — RISEDRONATE SODIUM 35 MG PO TABS: 35.0000 mg | ORAL_TABLET | ORAL | 1 refills | Status: DC

## 2017-12-26 MED ORDER — TAMOXIFEN CITRATE 20 MG PO TABS: 20.0000 mg | ORAL_TABLET | Freq: Every day | ORAL | 2 refills | Status: DC

## 2017-12-26 NOTE — Telephone Encounter (Signed)
Patient called and asked about the medicine she is to start on. Please return her call [phone]

## 2017-12-26 NOTE — Telephone Encounter (Signed)
Called and reports that patient is telling her there have been several calls about her medications and Karena Addison is asking to speak with Rachel Jones to see what is factual and what is not. Please return her call [phone]

## 2017-12-26 NOTE — Progress Notes (Signed)
Spoke with Dr. Grayland Ormond and he recommends the following: Do not take Fosamax   1. Will start on Risedronate 35 mg every week (every 14 days)   2. Stop Letrozole. Start Tamoxifen 20 mg daily.   Prescription sent to the pharmacy.  Patient notified and understands changes.  Will call if she has any side effects from tamoxifen or Risedronate.   Faythe Casa, NP 12/26/2017 10:54 AM

## 2018-01-04 ENCOUNTER — Telehealth: Payer: Self-pay | Admitting: *Deleted

## 2018-01-04 NOTE — Telephone Encounter (Signed)
Patient s questions regarding her new prescription, Please return her call [phone]

## 2018-01-04 NOTE — Telephone Encounter (Signed)
Call returned, questions answered

## 2018-01-04 NOTE — Telephone Encounter (Signed)
Which one?  Sonia Baller called in 2. One for osteoporosis and the second was tamoxifen to replace letrozole b/c of her worsening osteoporosis

## 2018-01-05 DIAGNOSIS — E538 Deficiency of other specified B group vitamins: Secondary | ICD-10-CM | POA: Diagnosis not present

## 2018-01-09 ENCOUNTER — Other Ambulatory Visit: Payer: Self-pay | Admitting: Oncology

## 2018-01-09 MED ORDER — RISEDRONATE SODIUM 35 MG PO TABS: 35.0000 mg | ORAL_TABLET | ORAL | 1 refills | Status: DC

## 2018-01-09 NOTE — Progress Notes (Signed)
Re-sent RX to [facility]   Risedronate 35 mg q 14 days.   Faythe Casa, NP 01/09/2018 8:43 AM

## 2018-02-01 DIAGNOSIS — R8290 Unspecified abnormal findings in urine: Secondary | ICD-10-CM | POA: Diagnosis not present

## 2018-02-01 DIAGNOSIS — R0609 Other forms of dyspnea: Secondary | ICD-10-CM | POA: Diagnosis not present

## 2018-02-01 DIAGNOSIS — Z0000 Encounter for general adult medical examination without abnormal findings: Secondary | ICD-10-CM | POA: Diagnosis not present

## 2018-02-01 DIAGNOSIS — E782 Mixed hyperlipidemia: Secondary | ICD-10-CM | POA: Diagnosis not present

## 2018-02-01 DIAGNOSIS — Z79899 Other long term (current) drug therapy: Secondary | ICD-10-CM | POA: Diagnosis not present

## 2018-02-01 DIAGNOSIS — I10 Essential (primary) hypertension: Secondary | ICD-10-CM | POA: Diagnosis not present

## 2018-02-01 DIAGNOSIS — N184 Chronic kidney disease, stage 4 (severe): Secondary | ICD-10-CM | POA: Diagnosis not present

## 2018-02-01 DIAGNOSIS — D508 Other iron deficiency anemias: Secondary | ICD-10-CM | POA: Diagnosis not present

## 2018-02-06 DIAGNOSIS — E538 Deficiency of other specified B group vitamins: Secondary | ICD-10-CM | POA: Diagnosis not present

## 2018-02-08 ENCOUNTER — Ambulatory Visit: Payer: Medicare HMO | Admitting: Radiation Oncology

## 2018-02-13 DIAGNOSIS — H34831 Tributary (branch) retinal vein occlusion, right eye, with macular edema: Secondary | ICD-10-CM | POA: Diagnosis not present

## 2018-02-15 ENCOUNTER — Ambulatory Visit: Payer: Medicare HMO | Admitting: Radiation Oncology

## 2018-02-26 DIAGNOSIS — R809 Proteinuria, unspecified: Secondary | ICD-10-CM | POA: Diagnosis not present

## 2018-02-26 DIAGNOSIS — N184 Chronic kidney disease, stage 4 (severe): Secondary | ICD-10-CM | POA: Diagnosis not present

## 2018-02-26 DIAGNOSIS — N2581 Secondary hyperparathyroidism of renal origin: Secondary | ICD-10-CM | POA: Diagnosis not present

## 2018-02-26 DIAGNOSIS — D631 Anemia in chronic kidney disease: Secondary | ICD-10-CM | POA: Diagnosis not present

## 2018-02-26 DIAGNOSIS — I129 Hypertensive chronic kidney disease with stage 1 through stage 4 chronic kidney disease, or unspecified chronic kidney disease: Secondary | ICD-10-CM | POA: Diagnosis not present

## 2018-02-28 ENCOUNTER — Ambulatory Visit
Admission: RE | Admit: 2018-02-28 | Discharge: 2018-02-28 | Disposition: A | Discharge status: Home / Self Care | Payer: Medicare HMO | Source: Ambulatory Visit | Attending: Radiation Oncology | Admitting: Radiation Oncology

## 2018-02-28 ENCOUNTER — Other Ambulatory Visit: Payer: Self-pay

## 2018-02-28 ENCOUNTER — Encounter: Payer: Self-pay | Admitting: Radiation Oncology

## 2018-02-28 DIAGNOSIS — Z923 Personal history of irradiation: Secondary | ICD-10-CM | POA: Insufficient documentation

## 2018-02-28 DIAGNOSIS — M129 Arthropathy, unspecified: Secondary | ICD-10-CM | POA: Insufficient documentation

## 2018-02-28 DIAGNOSIS — Z79811 Long term (current) use of aromatase inhibitors: Secondary | ICD-10-CM | POA: Diagnosis not present

## 2018-02-28 DIAGNOSIS — Z170 Estrogen receptor positive status [ER+]: Secondary | ICD-10-CM | POA: Diagnosis not present

## 2018-02-28 DIAGNOSIS — C50211 Malignant neoplasm of upper-inner quadrant of right female breast: Secondary | ICD-10-CM | POA: Diagnosis not present

## 2018-02-28 NOTE — Progress Notes (Signed)
Radiation Oncology Follow up Note  Name: Rachel Jones   Date:   02/28/2018 MRN:  [id number] DOB: [birth year]    This 84 y.o. female presents to the clinic today for 18 month follow-up status post whole breast radiation to her right breast for ER/PR positive stage I invasive mammary carcinoma.  REFERRING PROVIDER: Idelle Crouch, MD  HPI: patient is an 84 year old female now seen out 18 months having completed whole breast radiation to her right breast for ER/PR positive stage I invasive mammary carcinoma. Seen today in routine follow-up she is doing well. She specifically denies breast tenderness cough or bone pain. She does have some arthritis which has been fairly stable..she is currently on Femara tolerating that well without side effect.last mammogram which I have reviewed was back in January BI-RADS 1 benign she is a rescheduled for follow-up mammograms in January 2 20.  COMPLICATIONS OF TREATMENT: none  FOLLOW UP COMPLIANCE: keeps appointments   PHYSICAL EXAM:  BP (P) 132/65 (BP Location: Right Arm, Patient Position: Sitting)   Pulse (P) 67   Temp (!) (P) 96.6 F (35.9 C) (Tympanic)   Wt (P) 192 lb 14.4 oz (87.5 kg)   BMI (P) 35.28 kg/m  Lungs are clear to A&P cardiac examination essentially unremarkable with regular rate and rhythm. No dominant mass or nodularity is noted in either breast in 2 positions examined. Incision is well-healed. No axillary or supraclavicular adenopathy is appreciated. Cosmetic result is excellent.Well-developed well-nourished patient in NAD. HEENT reveals PERLA, EOMI, discs not visualized.  Oral cavity is clear. No oral mucosal lesions are identified. Neck is clear without evidence of cervical or supraclavicular adenopathy. Lungs are clear to A&P. Cardiac examination is essentially unremarkable with regular rate and rhythm without murmur rub or thrill. Abdomen is benign with no organomegaly or masses noted. Motor sensory and DTR levels are equal and  symmetric in the upper and lower extremities. Cranial nerves II through XII are grossly intact. Proprioception is intact. No peripheral adenopathy or edema is identified. No motor or sensory levels are noted. Crude visual fields are within normal range.  RADIOLOGY RESULTS: mammograms reviewed and compatible with the above-stated findings  PLAN: present time she continues to do well with no evidence of disease. I'm please were overall progress. I've asked to see her back in 1 year for follow-up. She or he has follow-up mammograms scheduled. She continues on Femara without side effect. Patient is to call with any concerns.  I would like to take this opportunity to thank you for allowing me to participate in the care of your patient.Noreene Filbert, MD

## 2018-03-05 DIAGNOSIS — I129 Hypertensive chronic kidney disease with stage 1 through stage 4 chronic kidney disease, or unspecified chronic kidney disease: Secondary | ICD-10-CM | POA: Diagnosis not present

## 2018-03-05 DIAGNOSIS — R809 Proteinuria, unspecified: Secondary | ICD-10-CM | POA: Diagnosis not present

## 2018-03-05 DIAGNOSIS — N184 Chronic kidney disease, stage 4 (severe): Secondary | ICD-10-CM | POA: Diagnosis not present

## 2018-03-05 DIAGNOSIS — E875 Hyperkalemia: Secondary | ICD-10-CM | POA: Diagnosis not present

## 2018-03-05 DIAGNOSIS — N259 Disorder resulting from impaired renal tubular function, unspecified: Secondary | ICD-10-CM | POA: Diagnosis not present

## 2018-03-09 DIAGNOSIS — E538 Deficiency of other specified B group vitamins: Secondary | ICD-10-CM | POA: Diagnosis not present

## 2018-04-09 ENCOUNTER — Ambulatory Visit (INDEPENDENT_AMBULATORY_CARE_PROVIDER_SITE_OTHER): Payer: Medicare HMO

## 2018-04-09 ENCOUNTER — Encounter (INDEPENDENT_AMBULATORY_CARE_PROVIDER_SITE_OTHER): Payer: Self-pay | Admitting: Vascular Surgery

## 2018-04-09 ENCOUNTER — Ambulatory Visit (INDEPENDENT_AMBULATORY_CARE_PROVIDER_SITE_OTHER): Payer: Medicare HMO | Admitting: Vascular Surgery

## 2018-04-09 VITALS — BP 167/88 | HR 73 | Resp 19 | Ht 63.0 in | Wt 192.0 lb

## 2018-04-09 DIAGNOSIS — E782 Mixed hyperlipidemia: Secondary | ICD-10-CM | POA: Diagnosis not present

## 2018-04-09 DIAGNOSIS — I872 Venous insufficiency (chronic) (peripheral): Secondary | ICD-10-CM

## 2018-04-09 DIAGNOSIS — K219 Gastro-esophageal reflux disease without esophagitis: Secondary | ICD-10-CM | POA: Diagnosis not present

## 2018-04-09 DIAGNOSIS — Z7982 Long term (current) use of aspirin: Secondary | ICD-10-CM | POA: Diagnosis not present

## 2018-04-09 DIAGNOSIS — E538 Deficiency of other specified B group vitamins: Secondary | ICD-10-CM | POA: Diagnosis not present

## 2018-04-09 DIAGNOSIS — I6523 Occlusion and stenosis of bilateral carotid arteries: Secondary | ICD-10-CM

## 2018-04-09 DIAGNOSIS — I10 Essential (primary) hypertension: Secondary | ICD-10-CM | POA: Diagnosis not present

## 2018-04-09 NOTE — Progress Notes (Signed)
MRN : [id number]  Rachel Jones is a 84 y.o. ([birth year]) female who presents with chief complaint of  Chief Complaint  Patient presents with  . Follow-up    Carotid f/u 1 year  .  History of Present Illness:   The patient is seen for follow up evaluation of carotid stenosis.  She is s/p left ICA stenting in January, 2016.  The carotid stenosis followed by ultrasound.   The patient denies amaurosis fugax. There is no recent history of TIA symptoms or focal motor deficits. There is no prior documented CVA.  The patient is taking enteric-coated aspirin 81 mg daily.  There is no history of migraine headaches. There is no history of seizures.  The patient has a history of coronary artery disease, no recent episodes of angina or shortness of breath. The patient denies PAD or claudication symptoms. There is a history of hyperlipidemia which is being treated with a statin.   Duplex ultrasound of the carotid arteries shows <30% ICA stenosis bilaterally  Current Meds  Medication Sig  . acetaminophen (TYLENOL) 500 MG tablet Take 1,000 mg by mouth daily. May take an additional 1000 mgs as needed for pain  . alendronate (FOSAMAX) 70 MG tablet Take 1 tablet (70 mg total) by mouth once a week. Take with a full glass of water on an empty stomach.  Marland Kitchen aspirin EC 81 MG tablet Take 81 mg by mouth at bedtime.   . bevacizumab (AVASTIN) 400 MG/16ML SOLN Inject into the vein.  . calcium-vitamin D (OSCAL WITH D) 500-200 MG-UNIT tablet Take 1 tablet by mouth 2 (two) times daily.  . calcium-vitamin D (OSCAL WITH D) 500-200 MG-UNIT TABS tablet TAKE 1 TABLET TWICE DAILY  . Cholecalciferol (VITAMIN D3) 1000 units CAPS Take 1,000 Units by mouth daily.   . ciprofloxacin (CIPRO) 250 MG tablet Take 1 tablet (250 mg total) by mouth 2 (two) times daily.  . clopidogrel (PLAVIX) 75 MG tablet TAKE 1 TABLET EVERY DAY  . Cranberry 500 MG TABS Take by mouth 2 (two) times daily.  . Cyanocobalamin (VITAMIN B-12  IJ) Inject 1,000 mcg as directed every 30 (thirty) days.  Marland Kitchen docusate sodium (STOOL SOFTENER) 100 MG capsule Take 100 mg by mouth 2 (two) times daily.  Marland Kitchen esomeprazole (NEXIUM) 40 MG capsule Take 40 mg by mouth daily.  . ferrous gluconate (FERGON) 324 MG tablet Take 324 mg by mouth daily.  . furosemide (LASIX) 20 MG tablet Take 20 mg by mouth daily as needed for edema.   Marland Kitchen losartan (COZAAR) 50 MG tablet Take 50 mg by mouth daily.  Marland Kitchen lovastatin (MEVACOR) 40 MG tablet Take 40 mg by mouth 2 (two) times daily.  . metoprolol succinate (TOPROL-XL) 25 MG 24 hr tablet Take by mouth.  . Probiotic Product (PROBIOTIC ADVANCED) CAPS Take by mouth.  . risedronate (ACTONEL) 35 MG tablet Take 1 tablet (35 mg total) by mouth every 14 (fourteen) days. Take 1 tablet by mouth every 14 days.  . tamoxifen (NOLVADEX) 20 MG tablet Take 1 tablet (20 mg total) by mouth daily.    Past Medical History:  Diagnosis Date  . Anemia    is followed by renal doctor and pcp  . Arthritis   . Breast cancer ([place]) 04/2016   Rt. breast ca, f/u with radiation  . Cancer [facility]) 1961   uterine cancer  . Coronary artery disease   . Dyspnea   . GERD (gastroesophageal reflux disease)   . Hypertension   .  Kidney disease    stage 4.  followed by dr. Sedonia Small  . Lower extremity edema   . S/P lumpectomy, right breast    having done on 04/26/16  . Sleep apnea    uses cpap    Past Surgical History:  Procedure Laterality Date  . BREAST BIOPSY Right 03/2016   invasive mammary carcinoma  . BREAST LUMPECTOMY Right    04/26/16, f/u with radiation  . CAROTID STENT Left 2016   was having pain down neck. carotid artery clogged. dr. Delana Meyer put stent in  . CAROTID STENT Left   . CATARACT EXTRACTION W/ INTRAOCULAR LENS  IMPLANT, BILATERAL Bilateral 2009  . EYE SURGERY  2009   cataracts  . PARTIAL HYSTERECTOMY  1961   uterus only removed  . PARTIAL MASTECTOMY WITH NEEDLE LOCALIZATION Right 04/26/2016   Procedure: PARTIAL MASTECTOMY WITH  NEEDLE LOCALIZATION;  Surgeon: Leonie Green, MD;  Location: [facility] ORS;  Service: General;  Laterality: Right;  . SENTINEL NODE BIOPSY Right 04/26/2016   Procedure: SENTINEL NODE BIOPSY;  Surgeon: Leonie Green, MD;  Location: [facility] ORS;  Service: General;  Laterality: Right;    Social History Social History   Tobacco Use  . Smoking status: Never Smoker  . Smokeless tobacco: Never Used  Substance Use Topics  . Alcohol use: No  . Drug use: No    Family History Family History  Problem Relation Age of Onset  . Bladder Cancer Neg Hx   . Kidney cancer Neg Hx   . Prolactinoma Neg Hx   . Prostate cancer Neg Hx     Allergies  Allergen Reactions  . Cefuroxime Axetil Diarrhea  . Codeine Nausea Only    Intolerance instead of true allerg  . Sulfa Antibiotics Swelling and Rash    swelling     REVIEW OF SYSTEMS (Negative unless checked)  Constitutional: [] Weight loss  [] Fever  [] Chills Cardiac: [] Chest pain   [] Chest pressure   [] Palpitations   [] Shortness of breath when laying flat   [] Shortness of breath with exertion. Vascular:  [] Pain in legs with walking   [] Pain in legs at rest  [] History of DVT   [] Phlebitis   [] Swelling in legs   [] Varicose veins   [] Non-healing ulcers Pulmonary:   [] Uses home oxygen   [] Productive cough   [] Hemoptysis   [] Wheeze  [] COPD   [] Asthma Neurologic:  [] Dizziness   [] Seizures   [x] History of stroke   [] History of TIA  [] Aphasia   [] Vissual changes   [] Weakness or numbness in arm   [] Weakness or numbness in leg Musculoskeletal:   [] Joint swelling   [x] Joint pain   [] Low back pain Hematologic:  [] Easy bruising  [] Easy bleeding   [] Hypercoagulable state   [] Anemic Gastrointestinal:  [] Diarrhea   [] Vomiting  [] Gastroesophageal reflux/heartburn   [] Difficulty swallowing. Genitourinary:  [] Chronic kidney disease   [] Difficult urination  [] Frequent urination   [] Blood in urine Skin:  [] Rashes   [] Ulcers  Psychological:  [] History of anxiety   []   History of major depression.  Physical Examination  Vitals:   04/09/18 1536  BP: (!) 167/88  Pulse: 73  Resp: 19  Weight: 192 lb (87.1 kg)  Height: 5\' 3"  (1.6 m)   Body mass index is 34.01 kg/m. Gen: WD/WN, NAD Head: [place]/AT, No temporalis wasting.  Ear/Nose/Throat: Hearing grossly intact, nares w/o erythema or drainage Eyes: PER, EOMI, sclera nonicteric.  Neck: Supple, no large masses.   Pulmonary:  Good air movement, no audible wheezing bilaterally, no  use of accessory muscles.  Cardiac: RRR, no JVD Vascular:  No carotid bruits Vessel Right Left  Radial Palpable Palpable  Brachial Palpable Palpable  Carotid Palpable Palpable  Gastrointestinal: Non-distended. No guarding/no peritoneal signs.  Musculoskeletal: M/S 5/5 throughout.  No deformity or atrophy.  Neurologic: CN 2-12 intact. Symmetrical.  Speech is fluent. Motor exam as listed above. Psychiatric: Judgment intact, Mood & affect appropriate for pt's clinical situation. Dermatologic: No rashes or ulcers noted.  No changes consistent with cellulitis. Lymph : No lichenification or skin changes of chronic lymphedema.  CBC Lab Results  Component Value Date   WBC 6.7 07/13/2016   HGB 12.2 07/13/2016   HCT 36.9 07/13/2016   MCV 92.9 07/13/2016   PLT 178 07/13/2016    BMET    Component Value Date/Time   NA 138 07/28/2016 1025   NA 142 05/28/2014 0548   K 4.8 07/28/2016 1025   K 4.5 05/28/2014 0548   CL 106 07/28/2016 1025   CL 106 05/28/2014 0548   CO2 22 07/28/2016 1025   CO2 29 05/28/2014 0548   GLUCOSE 96 07/28/2016 1025   GLUCOSE 99 05/28/2014 0548   BUN 39 (H) 07/28/2016 1025   BUN 27 (H) 05/28/2014 0548   CREATININE 1.74 (H) 07/28/2016 1025   CREATININE 1.91 (H) 05/28/2014 0548   CALCIUM 8.9 07/28/2016 1025   CALCIUM 7.4 (L) 05/28/2014 0548   GFRNONAA 27 (L) 07/28/2016 1025   GFRNONAA 27 (L) 05/28/2014 0548   GFRAA 31 (L) 07/28/2016 1025   GFRAA 33 (L) 05/28/2014 0548   CrCl cannot be calculated  (Patient's most recent lab result is older than the maximum 21 days allowed.).  COAG Lab Results  Component Value Date   INR 1.2 05/28/2014    Radiology Vas US Carotid  Result Date: 04/09/2018 Carotid Arterial Duplex Study Indications: Carotid artery disease. Performing Technologist: Almira Coaster RVS  Examination Guidelines: A complete evaluation includes B-mode imaging, spectral Doppler, color Doppler, and power Doppler as needed of all accessible portions of each vessel. Bilateral testing is considered an integral part of a complete examination. Limited examinations for reoccurring indications may be performed as noted.  Right Carotid Findings: +----------+--------+--------+--------+--------+--------+           PSV cm/sEDV cm/sStenosisDescribeComments +----------+--------+--------+--------+--------+--------+ CCA Prox  102     23                               +----------+--------+--------+--------+--------+--------+ CCA Mid   110     23                               +----------+--------+--------+--------+--------+--------+ CCA Distal74      17                               +----------+--------+--------+--------+--------+--------+ ICA Prox  72      14                               +----------+--------+--------+--------+--------+--------+ ICA Mid   130     37                      tortuous +----------+--------+--------+--------+--------+--------+ ICA Distal87      30                               +----------+--------+--------+--------+--------+--------+  ECA       96                                       +----------+--------+--------+--------+--------+--------+ +----------+--------+-------+--------+-------------------+           PSV cm/sEDV cmsDescribeArm Pressure (mmHG) +----------+--------+-------+--------+-------------------+ [id number]      0                                   +----------+--------+-------+--------+-------------------+ +---------+--------+--+--------+--+ VertebralPSV cm/s63EDV cm/s16 +---------+--------+--+--------+--+  Right Stent(s): +--------------+++++------+ Proximal Stentpatent +--------------+++++------+ Mid Stent     patent +--------------+++++------+ Distal Stent  patent +--------------+++++------+  Left Carotid Findings: +----------+--------+--------+--------+--------+----------+           PSV cm/sEDV cm/sStenosisDescribeComments   +----------+--------+--------+--------+--------+----------+ CCA Prox  103     23                                 +----------+--------+--------+--------+--------+----------+ CCA Mid   106     18                                 +----------+--------+--------+--------+--------+----------+ CCA Distal64      12                                 +----------+--------+--------+--------+--------+----------+ ICA Prox  74      18                      Prox stent +----------+--------+--------+--------+--------+----------+ ICA Mid   114     28                                 +----------+--------+--------+--------+--------+----------+ ICA Distal104     25                                 +----------+--------+--------+--------+--------+----------+ ECA       77      6                                  +----------+--------+--------+--------+--------+----------+ +----------+--------+--------+--------+-------------------+ SubclavianPSV cm/sEDV cm/sDescribeArm Pressure (mmHG) +----------+--------+--------+--------+-------------------+           69      0                                   +----------+--------+--------+--------+-------------------+ +---------+--------+--+--------+--+ VertebralPSV cm/s73EDV cm/s24 +---------+--------+--+--------+--+  Summary: Right Carotid: Velocities in the right ICA are consistent with a 1-39% stenosis. Left Carotid: Velocities  in the left ICA are consistent with a 1-39% stenosis. Vertebrals:  Bilateral vertebral arteries demonstrate antegrade flow. Subclavians: Normal flow hemodynamics were seen in bilateral subclavian              arteries. *See table(s) above for measurements and observations.  Electronically signed by Hortencia Pilar MD on 04/09/2018 at 4:49:53 PM.    Final      Assessment/Plan 1. Bilateral carotid artery  stenosis Recommend:  Given the patient's asymptomatic subcritical stenosis no further invasive testing or surgery at this time.  Duplex ultrasound shows <30% stenosis bilaterally.  Continue antiplatelet therapy as prescribed Continue management of CAD, HTN and Hyperlipidemia Healthy heart diet,  encouraged exercise at least 4 times per week Follow up in 12 months with duplex ultrasound and physical exam   - VAS US CAROTID; Future  2. Chronic venous insufficiency No surgery or intervention at this point in time.    I have had a long discussion with the patient regarding venous insufficiency and why it  causes symptoms. I have discussed with the patient the chronic skin changes that accompany venous insufficiency and the long term sequela such as infection and ulceration.  Patient will begin wearing graduated compression stockings class 1 (20-30 mmHg) or compression wraps on a daily basis a prescription was given. The patient will put the stockings on first thing in the morning and removing them in the evening. The patient is instructed specifically not to sleep in the stockings.    In addition, behavioral modification including several periods of elevation of the lower extremities during the day will be continued. I have demonstrated that proper elevation is a position with the ankles at heart level.  The patient is instructed to begin routine exercise, especially walking on a daily basis   3. Essential hypertension Continue antihypertensive medications as already ordered, these  medications have been reviewed and there are no changes at this time.   4. Gastroesophageal reflux disease without esophagitis Continue PPI as already ordered, this medication has been reviewed and there are no changes at this time.  Avoidence of caffeine and alcohol  Moderate elevation of the head of the bed   5. Mixed hyperlipidemia Continue statin as ordered and reviewed, no changes at this time     Hortencia Pilar, MD  04/09/2018 8:49 PM

## 2018-04-10 DIAGNOSIS — I6522 Occlusion and stenosis of left carotid artery: Secondary | ICD-10-CM | POA: Diagnosis not present

## 2018-04-10 DIAGNOSIS — R609 Edema, unspecified: Secondary | ICD-10-CM | POA: Diagnosis not present

## 2018-04-10 DIAGNOSIS — E782 Mixed hyperlipidemia: Secondary | ICD-10-CM | POA: Diagnosis not present

## 2018-04-10 DIAGNOSIS — H34831 Tributary (branch) retinal vein occlusion, right eye, with macular edema: Secondary | ICD-10-CM | POA: Diagnosis not present

## 2018-04-10 DIAGNOSIS — I10 Essential (primary) hypertension: Secondary | ICD-10-CM | POA: Diagnosis not present

## 2018-04-10 DIAGNOSIS — G4733 Obstructive sleep apnea (adult) (pediatric): Secondary | ICD-10-CM | POA: Diagnosis not present

## 2018-04-10 DIAGNOSIS — I739 Peripheral vascular disease, unspecified: Secondary | ICD-10-CM | POA: Diagnosis not present

## 2018-04-30 ENCOUNTER — Other Ambulatory Visit (INDEPENDENT_AMBULATORY_CARE_PROVIDER_SITE_OTHER): Payer: Self-pay

## 2018-04-30 MED ORDER — CLOPIDOGREL BISULFATE 75 MG PO TABS: 75.0000 mg | ORAL_TABLET | Freq: Every day | ORAL | 3 refills | Status: DC

## 2018-05-11 DIAGNOSIS — E782 Mixed hyperlipidemia: Secondary | ICD-10-CM | POA: Diagnosis not present

## 2018-05-11 DIAGNOSIS — N184 Chronic kidney disease, stage 4 (severe): Secondary | ICD-10-CM | POA: Diagnosis not present

## 2018-05-11 DIAGNOSIS — Z79899 Other long term (current) drug therapy: Secondary | ICD-10-CM | POA: Diagnosis not present

## 2018-05-11 DIAGNOSIS — I10 Essential (primary) hypertension: Secondary | ICD-10-CM | POA: Diagnosis not present

## 2018-05-11 DIAGNOSIS — D508 Other iron deficiency anemias: Secondary | ICD-10-CM | POA: Diagnosis not present

## 2018-05-11 DIAGNOSIS — R0602 Shortness of breath: Secondary | ICD-10-CM | POA: Diagnosis not present

## 2018-05-25 NOTE — Progress Notes (Deleted)
[place]  [phone] [phone]  ID: Rachel Jones OB: August 26, 1933  MR#: [id number]  KPQ#:[id number]  Patient Care Team: Idelle Crouch, MD as PCP - General (Internal Medicine)  CHIEF COMPLAINT: Pathologic stage IA ER positive, PR, HER-2 negative invasive carcinoma of the upper inner quadrant of right breast.  INTERVAL HISTORY: Patient returns to clinic today for routine six-month evaluation.  She continues to tolerate letrozole well without significant side effects.  She currently feels well and is asymptomatic.  She has no neurologic complaints. She denies any recent fevers or illnesses. She has a good appetite and denies weight loss. She has no chest pain or shortness of breath. She denies any nausea, vomiting, constipation, or diarrhea. She has no urinary complaints.  Patient feels at her baseline offers no specific complaints today.  REVIEW OF SYSTEMS:   Review of Systems  Constitutional: Negative.  Negative for fever, malaise/fatigue and weight loss.  Respiratory: Negative.  Negative for cough.   Cardiovascular: Negative.  Negative for chest pain and leg swelling.  Gastrointestinal: Negative.  Negative for abdominal pain and constipation.  Genitourinary: Negative.  Negative for dysuria.  Musculoskeletal: Negative.  Negative for myalgias.  Skin: Negative.  Negative for rash.  Neurological: Negative.  Negative for sensory change, focal weakness, weakness and headaches.  Psychiatric/Behavioral: Negative.  The patient is not nervous/anxious.     As per HPI. Otherwise, a complete review of systems is negative.  PAST MEDICAL HISTORY: Past Medical History:  Diagnosis Date  . Anemia    is followed by renal doctor and pcp  . Arthritis   . Breast cancer ([place]) 04/2016   Rt. breast ca, f/u with radiation  . Cancer [facility]) 1961   uterine cancer  . Coronary artery disease   . Dyspnea   . GERD (gastroesophageal reflux disease)   .  Hypertension   . Kidney disease    stage 4.  followed by dr. Sedonia Small  . Lower extremity edema   . S/P lumpectomy, right breast    having done on 04/26/16  . Sleep apnea    uses cpap    PAST SURGICAL HISTORY: Past Surgical History:  Procedure Laterality Date  . BREAST BIOPSY Right 03/2016   invasive mammary carcinoma  . BREAST LUMPECTOMY Right    04/26/16, f/u with radiation  . CAROTID STENT Left 2016   was having pain down neck. carotid artery clogged. dr. Delana Meyer put stent in  . CAROTID STENT Left   . CATARACT EXTRACTION W/ INTRAOCULAR LENS  IMPLANT, BILATERAL Bilateral 2009  . EYE SURGERY  2009   cataracts  . PARTIAL HYSTERECTOMY  1961   uterus only removed  . PARTIAL MASTECTOMY WITH NEEDLE LOCALIZATION Right 04/26/2016   Procedure: PARTIAL MASTECTOMY WITH NEEDLE LOCALIZATION;  Surgeon: Leonie Green, MD;  Location: [facility] ORS;  Service: General;  Laterality: Right;  . SENTINEL NODE BIOPSY Right 04/26/2016   Procedure: SENTINEL NODE BIOPSY;  Surgeon: Leonie Green, MD;  Location: [facility] ORS;  Service: General;  Laterality: Right;    FAMILY HISTORY: Family History  Problem Relation Age of Onset  . Bladder Cancer Neg Hx   . Kidney cancer Neg Hx   . Prolactinoma Neg Hx   . Prostate cancer Neg Hx     ADVANCED DIRECTIVES (Y/N):  N  HEALTH MAINTENANCE: Social History   Tobacco Use  . Smoking status: Never Smoker  . Smokeless tobacco: Never Used  Substance Use Topics  . Alcohol use: No  .  Drug use: No     Colonoscopy:  PAP:  Bone density:  Lipid panel:  Allergies  Allergen Reactions  . Cefuroxime Axetil Diarrhea  . Codeine Nausea Only    Intolerance instead of true allerg  . Sulfa Antibiotics Swelling and Rash    swelling    Current Outpatient Medications  Medication Sig Dispense Refill  . acetaminophen (TYLENOL) 500 MG tablet Take 1,000 mg by mouth daily. May take an additional 1000 mgs as needed for pain    . alendronate (FOSAMAX) 70 MG tablet  Take 1 tablet (70 mg total) by mouth once a week. Take with a full glass of water on an empty stomach. 30 tablet 0  . aspirin EC 81 MG tablet Take 81 mg by mouth at bedtime.     . bevacizumab (AVASTIN) 400 MG/16ML SOLN Inject into the vein.    . calcium-vitamin D (OSCAL WITH D) 500-200 MG-UNIT tablet Take 1 tablet by mouth 2 (two) times daily. 180 tablet 3  . calcium-vitamin D (OSCAL WITH D) 500-200 MG-UNIT TABS tablet TAKE 1 TABLET TWICE DAILY 180 tablet 3  . Cholecalciferol (VITAMIN D3) 1000 units CAPS Take 1,000 Units by mouth daily.     . ciprofloxacin (CIPRO) 250 MG tablet Take 1 tablet (250 mg total) by mouth 2 (two) times daily. 14 tablet 0  . clopidogrel (PLAVIX) 75 MG tablet Take 1 tablet (75 mg total) by mouth daily. 90 tablet 3  . Cranberry 500 MG TABS Take by mouth 2 (two) times daily.    . Cyanocobalamin (VITAMIN B-12 IJ) Inject 1,000 mcg as directed every 30 (thirty) days.    Marland Kitchen docusate sodium (STOOL SOFTENER) 100 MG capsule Take 100 mg by mouth 2 (two) times daily.    Marland Kitchen esomeprazole (NEXIUM) 40 MG capsule Take 40 mg by mouth daily.    . ferrous gluconate (FERGON) 324 MG tablet Take 324 mg by mouth daily.    . furosemide (LASIX) 20 MG tablet Take 20 mg by mouth daily as needed for edema.     Marland Kitchen losartan (COZAAR) 50 MG tablet Take 50 mg by mouth daily.    Marland Kitchen lovastatin (MEVACOR) 40 MG tablet Take 40 mg by mouth 2 (two) times daily.    . metoprolol succinate (TOPROL-XL) 25 MG 24 hr tablet Take by mouth.    . Probiotic Product (PROBIOTIC ADVANCED) CAPS Take by mouth.    . risedronate (ACTONEL) 35 MG tablet Take 1 tablet (35 mg total) by mouth every 14 (fourteen) days. Take 1 tablet by mouth every 14 days. 30 tablet 1  . tamoxifen (NOLVADEX) 20 MG tablet Take 1 tablet (20 mg total) by mouth daily. 90 tablet 2   No current facility-administered medications for this visit.     OBJECTIVE: There were no vitals filed for this visit.   There is no height or weight on file to calculate BMI.     ECOG FS:0 - Asymptomatic  General: Well-developed, well-nourished, no acute distress. Eyes: Pink conjunctiva, anicteric sclera. Breast: Bilateral breast and axilla without lumps or masses. Lungs: Clear to auscultation bilaterally. Heart: Regular rate and rhythm. No rubs, murmurs, or gallops. Abdomen: Soft, nontender, nondistended. No organomegaly noted, normoactive bowel sounds. Musculoskeletal: No edema, cyanosis, or clubbing. Neuro: Alert, answering all questions appropriately. Cranial nerves grossly intact. Skin: No rashes or petechiae noted. Psych: Normal affect.   LAB RESULTS:  Lab Results  Component Value Date   NA 138 07/28/2016   K 4.8 07/28/2016   CL 106 07/28/2016  CO2 22 07/28/2016   GLUCOSE 96 07/28/2016   BUN 39 (H) 07/28/2016   CREATININE 1.74 (H) 07/28/2016   CALCIUM 8.9 07/28/2016   GFRNONAA 27 (L) 07/28/2016   GFRAA 31 (L) 07/28/2016    Lab Results  Component Value Date   WBC 6.7 07/13/2016   NEUTROABS 5.0 05/28/2014   HGB 12.2 07/13/2016   HCT 36.9 07/13/2016   MCV 92.9 07/13/2016   PLT 178 07/13/2016     STUDIES: No results found.  ASSESSMENT: Pathologic stage IA ER positive, PR HER-2 negative invasive carcinoma of the upper inner quadrant of right breast. High-risk Mammoprint.  PLAN:    1. Pathologic stage IA ER positive, PR HER-2 negative invasive carcinoma of the upper inner quadrant of right breast: Final pathology was reviewed independently. Although patient was noted to have a high risk Mammoprint, it was determined given her advanced age as well as multiple comorbidities that chemotherapy was not in her best interest.  Patient's most recent mammogram on May 26, 2017 was reported as BI-RADS 2, repeat in January 2020.  Continue letrozole for a total of 5 years completing treatment in March 2023.  Return to clinic in 6 months for routine evaluation.  2.  Osteoporosis: Patient's bone marrow density in March 2018 reported T score of -2.5.   She is no longer taking Fosamax, but this may need to be reinitiated in the near future.  Patient did not go to her scheduled DEXA scan this past March, therefore will reschedule in the next 1 to 2 weeks.    Patient expressed understanding and was in agreement with this plan. She also understands that She can call clinic at any time with any questions, concerns, or complaints.   Cancer Staging Primary cancer of upper inner quadrant of right female breast [facility]) Staging form: Breast, AJCC 7th Edition - Pathologic stage from 05/15/2016: Stage IA (T1c, N0, cM0) - Signed by Lloyd Huger, MD on 05/15/2016   Lloyd Huger, MD   05/25/2018 1:57 PM

## 2018-05-29 ENCOUNTER — Ambulatory Visit
Admission: RE | Admit: 2018-05-29 | Discharge: 2018-05-29 | Disposition: A | Discharge status: Home / Self Care | Payer: Medicare HMO | Source: Ambulatory Visit | Attending: Oncology | Admitting: Oncology

## 2018-05-29 DIAGNOSIS — C50211 Malignant neoplasm of upper-inner quadrant of right female breast: Secondary | ICD-10-CM | POA: Diagnosis not present

## 2018-05-29 HISTORY — DX: Personal history of irradiation: Z92.3

## 2018-05-30 ENCOUNTER — Inpatient Hospital Stay: Payer: Medicare HMO | Admitting: Oncology

## 2018-05-30 ENCOUNTER — Ambulatory Visit: Payer: Medicare HMO | Admitting: Oncology

## 2018-06-04 ENCOUNTER — Ambulatory Visit: Payer: Medicare HMO | Admitting: Oncology

## 2018-06-10 NOTE — Progress Notes (Signed)
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  INTERVAL HISTORY: Patient returns to clinic today for routine 45-monthevaluation.  Given osteoporosis she was switched to tamoxifen and is tolerating this well.  She recently was on antibiotics for URI and a UTI, but these have resolved and she is back to her baseline.  She currently feels well and is asymptomatic.  She has no neurologic complaints.  She denies any fevers.  She has a good appetite and denies weight loss. She has no chest pain or shortness of breath. She denies any nausea, vomiting, constipation, or diarrhea. She has no urinary complaints.  Patient offers no specific complaints today.  REVIEW OF SYSTEMS:   Review of Systems  Constitutional: Negative.  Negative for fever, malaise/fatigue and weight loss.  Respiratory: Negative.  Negative for cough.   Cardiovascular: Negative.  Negative for chest pain and leg swelling.  Gastrointestinal: Negative.  Negative for abdominal pain and constipation.  Genitourinary: Negative.  Negative for dysuria.  Musculoskeletal: Negative.  Negative for myalgias.  Skin: Negative.  Negative for rash.  Neurological: Negative.  Negative for sensory change, focal weakness, weakness and headaches.  Psychiatric/Behavioral: Negative.  The patient is not nervous/anxious.     As per HPI. Otherwise, a complete review of systems is negative.  PAST MEDICAL HISTORY: Past Medical History:  Diagnosis Date  . Anemia    is followed by renal doctor and pcp  . Arthritis   . [place]   Rt. breast ca, f/u with radiation  . Cancer ([facility] 1961   uterine cancer  . Coronary artery disease     . Dyspnea   . GERD (gastroesophageal reflux disease)   . Hypertension   . Kidney disease    stage 4.  followed by dr. kSedonia Small . Lower extremity edema   . Personal history of radiation therapy   . S/P lumpectomy, right breast    having done on 04/26/16  . Sleep apnea    uses cpap    PAST SURGICAL HISTORY: Past Surgical History:  Procedure Laterality Date  . BREAST BIOPSY Right 03/2016   invasive mammary carcinoma  . BREAST LUMPECTOMY Right    04/26/16, f/u with radiation  . CAROTID STENT Left 2016   was having pain down neck. carotid artery clogged. dr. sDelana Meyerput stent in  . CAROTID STENT Left   . CATARACT EXTRACTION W/ INTRAOCULAR LENS  IMPLANT, BILATERAL Bilateral 2009  . EYE SURGERY  2009   cataracts  . PARTIAL HYSTERECTOMY  1961   uterus only removed  . PARTIAL MASTECTOMY WITH NEEDLE LOCALIZATION Right 04/26/2016   Procedure: PARTIAL MASTECTOMY WITH NEEDLE LOCALIZATION;  Surgeon: JLeonie Green MD;  Location: [facility] ORS;  Service: General;  Laterality: Right;  . SENTINEL NODE BIOPSY Right 04/26/2016   Procedure: SENTINEL NODE BIOPSY;  Surgeon: JLeonie Green MD;  Location: [facility] ORS;  Service: General;  Laterality: Right;    FAMILY HISTORY: Family History  Problem Relation Age of Onset  . Bladder Cancer Neg Hx   . Kidney cancer Neg Hx   . Prolactinoma Neg Hx   . Prostate cancer Neg Hx     ADVANCED DIRECTIVES (Y/N):  N  HEALTH MAINTENANCE: Social  History   Tobacco Use  . Smoking status: Never Smoker  . Smokeless tobacco: Never Used  Substance Use Topics  . Alcohol use: No  . Drug use: No     Colonoscopy:  PAP:  Bone density:  Lipid panel:  Allergies  Allergen Reactions  . Cefuroxime Axetil Diarrhea  . Codeine Nausea Only    Intolerance instead of true allerg  . Sulfa Antibiotics Swelling and Rash    swelling    Current Outpatient Medications  Medication Sig Dispense Refill  . acetaminophen (TYLENOL) 500 MG tablet Take 1,000 mg by  mouth daily. May take an additional 1000 mgs as needed for pain    . alendronate (FOSAMAX) 70 MG tablet Take 1 tablet (70 mg total) by mouth once a week. Take with a full glass of water on an empty stomach. 30 tablet 0  . aspirin EC 81 MG tablet Take 81 mg by mouth at bedtime.     . bevacizumab (AVASTIN) 400 MG/16ML SOLN Inject into the vein.    . calcium-vitamin D (OSCAL WITH D) 500-200 MG-UNIT tablet Take 1 tablet by mouth 2 (two) times daily. 180 tablet 3  . calcium-vitamin D (OSCAL WITH D) 500-200 MG-UNIT TABS tablet TAKE 1 TABLET TWICE DAILY 180 tablet 3  . Cholecalciferol (VITAMIN D3) 1000 units CAPS Take 1,000 Units by mouth daily.     . ciprofloxacin (CIPRO) 250 MG tablet Take 1 tablet (250 mg total) by mouth 2 (two) times daily. 14 tablet 0  . clopidogrel (PLAVIX) 75 MG tablet Take 1 tablet (75 mg total) by mouth daily. 90 tablet 3  . Cranberry 500 MG TABS Take by mouth 2 (two) times daily.    . Cyanocobalamin (VITAMIN B-12 IJ) Inject 1,000 mcg as directed every 30 (thirty) days.    Marland Kitchen docusate sodium (STOOL SOFTENER) 100 MG capsule Take 100 mg by mouth 2 (two) times daily.    Marland Kitchen esomeprazole (NEXIUM) 40 MG capsule Take 40 mg by mouth daily.    . ferrous gluconate (FERGON) 324 MG tablet Take 324 mg by mouth daily.    . furosemide (LASIX) 20 MG tablet Take 20 mg by mouth daily as needed for edema.     Marland Kitchen levofloxacin (LEVAQUIN) 500 MG tablet 1 tablet.    Marland Kitchen losartan (COZAAR) 50 MG tablet Take 50 mg by mouth daily.    Marland Kitchen lovastatin (MEVACOR) 40 MG tablet Take 40 mg by mouth 2 (two) times daily.    . metoprolol succinate (TOPROL-XL) 25 MG 24 hr tablet Take by mouth.    . predniSONE (DELTASONE) 10 MG tablet 1 tablet.    . Probiotic Product (PROBIOTIC ADVANCED) CAPS Take by mouth.    . risedronate (ACTONEL) 35 MG tablet Take 1 tablet (35 mg total) by mouth every 14 (fourteen) days. Take 1 tablet by mouth every 14 days. 30 tablet 1  . tamoxifen (NOLVADEX) 20 MG tablet Take 1 tablet (20 mg total)  by mouth daily. 90 tablet 2   No current facility-administered medications for this visit.     OBJECTIVE: Vitals:   06/11/18 1450  BP: 113/78  Pulse: 76  Temp: (!) 97.3 F (36.3 C)     Body mass index is 32.95 kg/m.    ECOG FS:0 - Asymptomatic  General: Well-developed, well-nourished, no acute distress. Eyes: Pink conjunctiva, anicteric sclera. HEENT: Normocephalic, moist mucous membranes. Lungs: Clear to auscultation bilaterally. Heart: Regular rate and rhythm. No rubs, murmurs, or gallops. Abdomen: Soft, nontender, nondistended. No organomegaly noted, normoactive  bowel sounds. Musculoskeletal: No edema, cyanosis, or clubbing. Neuro: Alert, answering all questions appropriately. Cranial nerves grossly intact. Skin: No rashes or petechiae noted. Psych: Normal affect.  LAB RESULTS:  Lab Results  Component Value Date   NA 138 07/28/2016   K 4.8 07/28/2016   CL 106 07/28/2016   CO2 22 07/28/2016   GLUCOSE 96 07/28/2016   BUN 39 (H) 07/28/2016   CREATININE 1.74 (H) 07/28/2016   CALCIUM 8.9 07/28/2016   GFRNONAA 27 (L) 07/28/2016   GFRAA 31 (L) 07/28/2016    Lab Results  Component Value Date   WBC 6.7 07/13/2016   NEUTROABS 5.0 05/28/2014   HGB 12.2 07/13/2016   HCT 36.9 07/13/2016   MCV 92.9 07/13/2016   PLT 178 07/13/2016     STUDIES: Mm Diag Breast Tomo Bilateral  Result Date: 05/29/2018 CLINICAL DATA:  History of treated right breast cancer, status post lumpectomy and radiation therapy in 2017. EXAM: DIGITAL DIAGNOSTIC BILATERAL MAMMOGRAM WITH CAD AND TOMO COMPARISON:  Previous exam(s). ACR Breast Density Category b: There are scattered areas of fibroglandular density. FINDINGS: Mammographically, there are no suspicious masses, areas of nonsurgical architectural distortion or microcalcifications in either breast. Stable posttreatment changes in the right breast. Mammographic images were processed with CAD. IMPRESSION: No mammographic evidence of malignancy in  either breast, status post right lumpectomy. RECOMMENDATION: Diagnostic mammogram is suggested in 1 year. (Code:[id number]-B-01Y) I have discussed the findings and recommendations with the patient. Results were also provided in writing at the conclusion of the visit. If applicable, a reminder letter will be sent to the patient regarding the next appointment. BI-RADS CATEGORY  2: Benign. Electronically Signed   By: Fidela Salisbury M.D.   On: 05/29/2018 11:18    ASSESSMENT: Pathologic stage IA ER positive, PR HER-2 negative invasive carcinoma of the upper inner quadrant of right breast. High-risk Mammoprint.  PLAN:    1. Pathologic stage IA ER positive, PR HER-2 negative invasive carcinoma of the upper inner quadrant of right breast: Final pathology was reviewed independently. Although patient was noted to have a high risk Mammoprint, it was determined given her advanced age as well as multiple comorbidities that chemotherapy was not in her best interest.  Patient was switched to tamoxifen given osteoporosis and is tolerating this well.  She will complete 5 years of treatment in March 2023.  Her most recent mammogram on May 29, 2018 was reported as BI-RADS 2, repeat in 1 year.  Return to clinic in 6 months for routine evaluation.    2.  Osteoporosis: Patient's most recent bone mineral density on December 14, 2017 reported a T score of -3.0.  This was significantly worse than March 2018 when her bone mineral density reported a T score of -2.5.  Letrozole was discontinued and patient was initiated on tamoxifen.  She initially was placed on weekly Fosamax, but this was switched to 35 mg risedronate every 2 weeks by her primary nephrologist.  Repeat bone mineral density in July 2020.  Return to clinic as above.    Patient expressed understanding and was in agreement with this plan. She also understands that She can call clinic at any time with any questions, concerns, or complaints.   Cancer Staging Primary cancer  of upper inner quadrant of right female breast [facility]) Staging form: Breast, AJCC 7th Edition - Pathologic stage from 05/15/2016: Stage IA (T1c, N0, cM0) - Signed by Lloyd Huger, MD on 05/15/2016   Lloyd Huger, MD   06/12/2018 1:30 PM

## 2018-06-11 ENCOUNTER — Inpatient Hospital Stay: Payer: Medicare HMO | Attending: Oncology | Admitting: Oncology

## 2018-06-11 ENCOUNTER — Other Ambulatory Visit: Payer: Self-pay

## 2018-06-11 VITALS — BP 113/78 | HR 76 | Temp 97.3°F | Ht 63.0 in | Wt 186.0 lb

## 2018-06-11 DIAGNOSIS — D649 Anemia, unspecified: Secondary | ICD-10-CM | POA: Diagnosis not present

## 2018-06-11 DIAGNOSIS — Z170 Estrogen receptor positive status [ER+]: Secondary | ICD-10-CM | POA: Insufficient documentation

## 2018-06-11 DIAGNOSIS — Z79899 Other long term (current) drug therapy: Secondary | ICD-10-CM | POA: Insufficient documentation

## 2018-06-11 DIAGNOSIS — Z7982 Long term (current) use of aspirin: Secondary | ICD-10-CM | POA: Diagnosis not present

## 2018-06-11 DIAGNOSIS — Z79810 Long term (current) use of selective estrogen receptor modulators (SERMs): Secondary | ICD-10-CM | POA: Diagnosis not present

## 2018-06-11 DIAGNOSIS — Z923 Personal history of irradiation: Secondary | ICD-10-CM | POA: Diagnosis not present

## 2018-06-11 DIAGNOSIS — K219 Gastro-esophageal reflux disease without esophagitis: Secondary | ICD-10-CM | POA: Diagnosis not present

## 2018-06-11 DIAGNOSIS — C50211 Malignant neoplasm of upper-inner quadrant of right female breast: Secondary | ICD-10-CM | POA: Insufficient documentation

## 2018-06-11 DIAGNOSIS — M810 Age-related osteoporosis without current pathological fracture: Secondary | ICD-10-CM | POA: Insufficient documentation

## 2018-06-11 DIAGNOSIS — R0602 Shortness of breath: Secondary | ICD-10-CM | POA: Insufficient documentation

## 2018-06-11 DIAGNOSIS — G4730 Sleep apnea, unspecified: Secondary | ICD-10-CM | POA: Insufficient documentation

## 2018-06-11 DIAGNOSIS — M129 Arthropathy, unspecified: Secondary | ICD-10-CM | POA: Diagnosis not present

## 2018-06-11 DIAGNOSIS — I10 Essential (primary) hypertension: Secondary | ICD-10-CM | POA: Insufficient documentation

## 2018-06-11 NOTE — Progress Notes (Signed)
Patient is here today to follow up on her primary cancer of upper inner quadrant of right breast. Patient stated that she had been doing well. Patient denied nipple discharge, skin discoloration and/or lumps or knots on her breasts. However, in the last two weeks she had been sick with a URI and UTI. Patient had been given antibiotics and had finished them. Patient stated that she is feeling better better from that.

## 2018-09-18 ENCOUNTER — Other Ambulatory Visit: Payer: Self-pay | Admitting: Oncology

## 2018-09-18 NOTE — Telephone Encounter (Signed)
Last OV 06/11/18, Next OV 12/19/18.  Last rx 12/26/17, #90 with 2 refills.

## 2018-09-21 ENCOUNTER — Other Ambulatory Visit: Payer: Self-pay

## 2018-09-21 ENCOUNTER — Other Ambulatory Visit: Payer: Self-pay | Admitting: Internal Medicine

## 2018-09-21 ENCOUNTER — Encounter (INDEPENDENT_AMBULATORY_CARE_PROVIDER_SITE_OTHER): Payer: Self-pay

## 2018-09-21 ENCOUNTER — Ambulatory Visit
Admission: RE | Admit: 2018-09-21 | Discharge: 2018-09-21 | Disposition: A | Discharge status: Home / Self Care | Payer: Medicare HMO | Source: Ambulatory Visit | Attending: Internal Medicine | Admitting: Internal Medicine

## 2018-09-21 DIAGNOSIS — M79661 Pain in right lower leg: Secondary | ICD-10-CM | POA: Diagnosis present

## 2018-09-21 DIAGNOSIS — M7989 Other specified soft tissue disorders: Secondary | ICD-10-CM | POA: Insufficient documentation

## 2018-11-05 DIAGNOSIS — N184 Chronic kidney disease, stage 4 (severe): Secondary | ICD-10-CM | POA: Diagnosis not present

## 2018-11-05 DIAGNOSIS — R809 Proteinuria, unspecified: Secondary | ICD-10-CM | POA: Diagnosis not present

## 2018-11-05 DIAGNOSIS — I129 Hypertensive chronic kidney disease with stage 1 through stage 4 chronic kidney disease, or unspecified chronic kidney disease: Secondary | ICD-10-CM | POA: Diagnosis not present

## 2018-11-05 DIAGNOSIS — N2581 Secondary hyperparathyroidism of renal origin: Secondary | ICD-10-CM | POA: Diagnosis not present

## 2018-11-05 DIAGNOSIS — D631 Anemia in chronic kidney disease: Secondary | ICD-10-CM | POA: Diagnosis not present

## 2018-11-05 DIAGNOSIS — E875 Hyperkalemia: Secondary | ICD-10-CM | POA: Diagnosis not present

## 2018-11-12 DIAGNOSIS — R809 Proteinuria, unspecified: Secondary | ICD-10-CM | POA: Diagnosis not present

## 2018-11-12 DIAGNOSIS — N184 Chronic kidney disease, stage 4 (severe): Secondary | ICD-10-CM | POA: Diagnosis not present

## 2018-11-12 DIAGNOSIS — D631 Anemia in chronic kidney disease: Secondary | ICD-10-CM | POA: Diagnosis not present

## 2018-11-12 DIAGNOSIS — I129 Hypertensive chronic kidney disease with stage 1 through stage 4 chronic kidney disease, or unspecified chronic kidney disease: Secondary | ICD-10-CM | POA: Diagnosis not present

## 2018-11-12 DIAGNOSIS — E875 Hyperkalemia: Secondary | ICD-10-CM | POA: Diagnosis not present

## 2018-11-12 DIAGNOSIS — N2581 Secondary hyperparathyroidism of renal origin: Secondary | ICD-10-CM | POA: Diagnosis not present

## 2018-11-28 DIAGNOSIS — N184 Chronic kidney disease, stage 4 (severe): Secondary | ICD-10-CM | POA: Diagnosis not present

## 2018-11-28 DIAGNOSIS — M0579 Rheumatoid arthritis with rheumatoid factor of multiple sites without organ or systems involvement: Secondary | ICD-10-CM | POA: Diagnosis not present

## 2018-11-28 DIAGNOSIS — I10 Essential (primary) hypertension: Secondary | ICD-10-CM | POA: Diagnosis not present

## 2018-12-02 DIAGNOSIS — D630 Anemia in neoplastic disease: Secondary | ICD-10-CM | POA: Diagnosis not present

## 2018-12-02 DIAGNOSIS — M109 Gout, unspecified: Secondary | ICD-10-CM | POA: Diagnosis not present

## 2018-12-02 DIAGNOSIS — R32 Unspecified urinary incontinence: Secondary | ICD-10-CM | POA: Diagnosis not present

## 2018-12-02 DIAGNOSIS — C50911 Malignant neoplasm of unspecified site of right female breast: Secondary | ICD-10-CM | POA: Diagnosis not present

## 2018-12-02 DIAGNOSIS — I6522 Occlusion and stenosis of left carotid artery: Secondary | ICD-10-CM | POA: Diagnosis not present

## 2018-12-02 DIAGNOSIS — M0579 Rheumatoid arthritis with rheumatoid factor of multiple sites without organ or systems involvement: Secondary | ICD-10-CM | POA: Diagnosis not present

## 2018-12-02 DIAGNOSIS — I129 Hypertensive chronic kidney disease with stage 1 through stage 4 chronic kidney disease, or unspecified chronic kidney disease: Secondary | ICD-10-CM | POA: Diagnosis not present

## 2018-12-02 DIAGNOSIS — K219 Gastro-esophageal reflux disease without esophagitis: Secondary | ICD-10-CM | POA: Diagnosis not present

## 2018-12-02 DIAGNOSIS — N184 Chronic kidney disease, stage 4 (severe): Secondary | ICD-10-CM | POA: Diagnosis not present

## 2018-12-03 DIAGNOSIS — I129 Hypertensive chronic kidney disease with stage 1 through stage 4 chronic kidney disease, or unspecified chronic kidney disease: Secondary | ICD-10-CM | POA: Diagnosis not present

## 2018-12-03 DIAGNOSIS — I6522 Occlusion and stenosis of left carotid artery: Secondary | ICD-10-CM | POA: Diagnosis not present

## 2018-12-03 DIAGNOSIS — C50911 Malignant neoplasm of unspecified site of right female breast: Secondary | ICD-10-CM | POA: Diagnosis not present

## 2018-12-03 DIAGNOSIS — R32 Unspecified urinary incontinence: Secondary | ICD-10-CM | POA: Diagnosis not present

## 2018-12-03 DIAGNOSIS — M0579 Rheumatoid arthritis with rheumatoid factor of multiple sites without organ or systems involvement: Secondary | ICD-10-CM | POA: Diagnosis not present

## 2018-12-03 DIAGNOSIS — M109 Gout, unspecified: Secondary | ICD-10-CM | POA: Diagnosis not present

## 2018-12-03 DIAGNOSIS — D630 Anemia in neoplastic disease: Secondary | ICD-10-CM | POA: Diagnosis not present

## 2018-12-03 DIAGNOSIS — N184 Chronic kidney disease, stage 4 (severe): Secondary | ICD-10-CM | POA: Diagnosis not present

## 2018-12-03 DIAGNOSIS — K219 Gastro-esophageal reflux disease without esophagitis: Secondary | ICD-10-CM | POA: Diagnosis not present

## 2018-12-05 DIAGNOSIS — K219 Gastro-esophageal reflux disease without esophagitis: Secondary | ICD-10-CM | POA: Diagnosis not present

## 2018-12-05 DIAGNOSIS — I6522 Occlusion and stenosis of left carotid artery: Secondary | ICD-10-CM | POA: Diagnosis not present

## 2018-12-05 DIAGNOSIS — I129 Hypertensive chronic kidney disease with stage 1 through stage 4 chronic kidney disease, or unspecified chronic kidney disease: Secondary | ICD-10-CM | POA: Diagnosis not present

## 2018-12-05 DIAGNOSIS — C50911 Malignant neoplasm of unspecified site of right female breast: Secondary | ICD-10-CM | POA: Diagnosis not present

## 2018-12-05 DIAGNOSIS — D630 Anemia in neoplastic disease: Secondary | ICD-10-CM | POA: Diagnosis not present

## 2018-12-05 DIAGNOSIS — R32 Unspecified urinary incontinence: Secondary | ICD-10-CM | POA: Diagnosis not present

## 2018-12-05 DIAGNOSIS — M0579 Rheumatoid arthritis with rheumatoid factor of multiple sites without organ or systems involvement: Secondary | ICD-10-CM | POA: Diagnosis not present

## 2018-12-05 DIAGNOSIS — M109 Gout, unspecified: Secondary | ICD-10-CM | POA: Diagnosis not present

## 2018-12-05 DIAGNOSIS — N184 Chronic kidney disease, stage 4 (severe): Secondary | ICD-10-CM | POA: Diagnosis not present

## 2018-12-10 DIAGNOSIS — M0579 Rheumatoid arthritis with rheumatoid factor of multiple sites without organ or systems involvement: Secondary | ICD-10-CM | POA: Diagnosis not present

## 2018-12-10 DIAGNOSIS — M109 Gout, unspecified: Secondary | ICD-10-CM | POA: Diagnosis not present

## 2018-12-10 DIAGNOSIS — D630 Anemia in neoplastic disease: Secondary | ICD-10-CM | POA: Diagnosis not present

## 2018-12-10 DIAGNOSIS — I6522 Occlusion and stenosis of left carotid artery: Secondary | ICD-10-CM | POA: Diagnosis not present

## 2018-12-10 DIAGNOSIS — N184 Chronic kidney disease, stage 4 (severe): Secondary | ICD-10-CM | POA: Diagnosis not present

## 2018-12-10 DIAGNOSIS — K219 Gastro-esophageal reflux disease without esophagitis: Secondary | ICD-10-CM | POA: Diagnosis not present

## 2018-12-10 DIAGNOSIS — I129 Hypertensive chronic kidney disease with stage 1 through stage 4 chronic kidney disease, or unspecified chronic kidney disease: Secondary | ICD-10-CM | POA: Diagnosis not present

## 2018-12-10 DIAGNOSIS — H34831 Tributary (branch) retinal vein occlusion, right eye, with macular edema: Secondary | ICD-10-CM | POA: Diagnosis not present

## 2018-12-10 DIAGNOSIS — C50911 Malignant neoplasm of unspecified site of right female breast: Secondary | ICD-10-CM | POA: Diagnosis not present

## 2018-12-10 DIAGNOSIS — R32 Unspecified urinary incontinence: Secondary | ICD-10-CM | POA: Diagnosis not present

## 2018-12-11 DIAGNOSIS — I6522 Occlusion and stenosis of left carotid artery: Secondary | ICD-10-CM | POA: Diagnosis not present

## 2018-12-11 DIAGNOSIS — C50911 Malignant neoplasm of unspecified site of right female breast: Secondary | ICD-10-CM | POA: Diagnosis not present

## 2018-12-11 DIAGNOSIS — N184 Chronic kidney disease, stage 4 (severe): Secondary | ICD-10-CM | POA: Diagnosis not present

## 2018-12-11 DIAGNOSIS — M109 Gout, unspecified: Secondary | ICD-10-CM | POA: Diagnosis not present

## 2018-12-11 DIAGNOSIS — R32 Unspecified urinary incontinence: Secondary | ICD-10-CM | POA: Diagnosis not present

## 2018-12-11 DIAGNOSIS — I129 Hypertensive chronic kidney disease with stage 1 through stage 4 chronic kidney disease, or unspecified chronic kidney disease: Secondary | ICD-10-CM | POA: Diagnosis not present

## 2018-12-11 DIAGNOSIS — M0579 Rheumatoid arthritis with rheumatoid factor of multiple sites without organ or systems involvement: Secondary | ICD-10-CM | POA: Diagnosis not present

## 2018-12-11 DIAGNOSIS — D630 Anemia in neoplastic disease: Secondary | ICD-10-CM | POA: Diagnosis not present

## 2018-12-11 DIAGNOSIS — K219 Gastro-esophageal reflux disease without esophagitis: Secondary | ICD-10-CM | POA: Diagnosis not present

## 2018-12-13 DIAGNOSIS — I6522 Occlusion and stenosis of left carotid artery: Secondary | ICD-10-CM | POA: Diagnosis not present

## 2018-12-13 DIAGNOSIS — N184 Chronic kidney disease, stage 4 (severe): Secondary | ICD-10-CM | POA: Diagnosis not present

## 2018-12-13 DIAGNOSIS — M0579 Rheumatoid arthritis with rheumatoid factor of multiple sites without organ or systems involvement: Secondary | ICD-10-CM | POA: Diagnosis not present

## 2018-12-13 DIAGNOSIS — C50911 Malignant neoplasm of unspecified site of right female breast: Secondary | ICD-10-CM | POA: Diagnosis not present

## 2018-12-13 DIAGNOSIS — R32 Unspecified urinary incontinence: Secondary | ICD-10-CM | POA: Diagnosis not present

## 2018-12-13 DIAGNOSIS — K219 Gastro-esophageal reflux disease without esophagitis: Secondary | ICD-10-CM | POA: Diagnosis not present

## 2018-12-13 DIAGNOSIS — D630 Anemia in neoplastic disease: Secondary | ICD-10-CM | POA: Diagnosis not present

## 2018-12-13 DIAGNOSIS — I129 Hypertensive chronic kidney disease with stage 1 through stage 4 chronic kidney disease, or unspecified chronic kidney disease: Secondary | ICD-10-CM | POA: Diagnosis not present

## 2018-12-13 DIAGNOSIS — M109 Gout, unspecified: Secondary | ICD-10-CM | POA: Diagnosis not present

## 2018-12-14 DIAGNOSIS — N184 Chronic kidney disease, stage 4 (severe): Secondary | ICD-10-CM | POA: Diagnosis not present

## 2018-12-14 DIAGNOSIS — I6522 Occlusion and stenosis of left carotid artery: Secondary | ICD-10-CM | POA: Diagnosis not present

## 2018-12-14 DIAGNOSIS — C50911 Malignant neoplasm of unspecified site of right female breast: Secondary | ICD-10-CM | POA: Diagnosis not present

## 2018-12-14 DIAGNOSIS — K219 Gastro-esophageal reflux disease without esophagitis: Secondary | ICD-10-CM | POA: Diagnosis not present

## 2018-12-14 DIAGNOSIS — D630 Anemia in neoplastic disease: Secondary | ICD-10-CM | POA: Diagnosis not present

## 2018-12-14 DIAGNOSIS — M0579 Rheumatoid arthritis with rheumatoid factor of multiple sites without organ or systems involvement: Secondary | ICD-10-CM | POA: Diagnosis not present

## 2018-12-14 DIAGNOSIS — M109 Gout, unspecified: Secondary | ICD-10-CM | POA: Diagnosis not present

## 2018-12-14 DIAGNOSIS — I129 Hypertensive chronic kidney disease with stage 1 through stage 4 chronic kidney disease, or unspecified chronic kidney disease: Secondary | ICD-10-CM | POA: Diagnosis not present

## 2018-12-14 DIAGNOSIS — R32 Unspecified urinary incontinence: Secondary | ICD-10-CM | POA: Diagnosis not present

## 2018-12-16 NOTE — Progress Notes (Signed)
[name]  [phone] [phone]) [phone]  ID: Rachel Jones OB: 1933-08-28  MR#: [id number]  AOZ#:[id number]  Patient Care Team: Idelle Crouch, MD as PCP - General (Internal Medicine)  CHIEF COMPLAINT: Pathologic stage IA ER positive, PR, HER-2 negative invasive carcinoma of the upper inner quadrant of right breast.  INTERVAL HISTORY: Patient returns to clinic today for routine 61-monthevaluation.  She recently was found to have a right lower extremity DVT on Sep 21, 2018 and is now on Xarelto.  She otherwise has felt well and is asymptomatic. She has no neurologic complaints.  She denies any fevers.  She has a good appetite and denies weight loss. She has no chest pain or shortness of breath. She denies any nausea, vomiting, constipation, or diarrhea. She has no urinary complaints.  Patient offers no further specific complaints today.  REVIEW OF SYSTEMS:   Review of Systems  Constitutional: Negative.  Negative for fever, malaise/fatigue and weight loss.  Respiratory: Negative.  Negative for cough.   Cardiovascular: Negative.  Negative for chest pain and leg swelling.  Gastrointestinal: Negative.  Negative for abdominal pain and constipation.  Genitourinary: Negative.  Negative for dysuria.  Musculoskeletal: Positive for joint pain. Negative for myalgias.  Skin: Negative.  Negative for rash.  Neurological: Negative.  Negative for sensory change, focal weakness, weakness and headaches.  Psychiatric/Behavioral: Negative.  The patient is not nervous/anxious.     As per HPI. Otherwise, a complete review of systems is negative.  PAST MEDICAL HISTORY: Past Medical History:  Diagnosis Date   Anemia    is followed by renal doctor and pcp   Arthritis    [place]   Rt. breast ca, f/u with radiation   [place]   [place] cancer   Coronary artery disease    Dyspnea    GERD (gastroesophageal reflux disease)     Hypertension    Kidney disease    stage 4.  followed by dr. kSedonia Small  Lower extremity edema    Personal history of radiation therapy    S/P lumpectomy, right breast    having done on 04/26/16   Sleep apnea    uses cpap    PAST SURGICAL HISTORY: Past Surgical History:  Procedure Laterality Date   BREAST BIOPSY Right 03/2016   invasive mammary carcinoma   BREAST LUMPECTOMY Right    04/26/16, f/u with radiation   CAROTID STENT Left 2016   was having pain down neck. carotid artery clogged. dr. sDelana Meyerput stent in   CAROTID STENT Left    CATARACT EXTRACTION W/ INTRAOCULAR LENS  IMPLANT, BILATERAL Bilateral 2009   EYE SURGERY  2009   cataracts   PARTIAL HYSTERECTOMY  1961   uterus only removed   PARTIAL MASTECTOMY WITH NEEDLE LOCALIZATION Right 04/26/2016   Procedure: PARTIAL MASTECTOMY WITH NEEDLE LOCALIZATION;  Surgeon: JLeonie Green MD;  Location: [facility] ORS;  Service: General;  Laterality: Right;   SENTINEL NODE BIOPSY Right 04/26/2016   Procedure: SENTINEL NODE BIOPSY;  Surgeon: JLeonie Green MD;  Location: [facility] ORS;  Service: General;  Laterality: Right;    FAMILY HISTORY: Family History  Problem Relation Age of Onset   Bladder Cancer Neg Hx    Kidney cancer Neg Hx    Prolactinoma Neg Hx    Prostate cancer Neg Hx     ADVANCED DIRECTIVES (Y/N):  N  HEALTH MAINTENANCE: Social History   Tobacco Use   Smoking status: Never Smoker  Smokeless tobacco: Never Used  Substance Use Topics   Alcohol use: No   Drug use: No     Colonoscopy:  PAP:  Bone density:  Lipid panel:  Allergies  Allergen Reactions   Cefuroxime Axetil Diarrhea   Codeine Nausea Only    Intolerance instead of true allerg   Sulfa Antibiotics Swelling and Rash    swelling    Current Outpatient Medications  Medication Sig Dispense Refill   acetaminophen (TYLENOL) 500 MG tablet Take 1,000 mg by mouth daily. May take an additional 1000 mgs as needed for pain      alendronate (FOSAMAX) 70 MG tablet Take 1 tablet (70 mg total) by mouth once a week. Take with a full glass of water on an empty stomach. 30 tablet 0   apixaban (ELIQUIS) 2.5 MG TABS tablet Take 1 tablet by mouth 2 (two) times a day.     calcium-vitamin D (OSCAL WITH D) 500-200 MG-UNIT tablet Take 1 tablet by mouth 2 (two) times daily. 180 tablet 3   Cholecalciferol (VITAMIN D3) 1000 units CAPS Take 1,000 Units by mouth daily.      Cranberry 500 MG TABS Take by mouth 2 (two) times daily.     Cyanocobalamin (VITAMIN B-12 IJ) Inject 1,000 mcg as directed every 30 (thirty) days.     docusate sodium (STOOL SOFTENER) 100 MG capsule Take 100 mg by mouth 2 (two) times daily.     esomeprazole (NEXIUM) 40 MG capsule Take 40 mg by mouth daily.     ferrous gluconate (FERGON) 324 MG tablet Take 324 mg by mouth daily.     furosemide (LASIX) 20 MG tablet Take 20 mg by mouth daily as needed for edema.      losartan (COZAAR) 50 MG tablet Take 50 mg by mouth daily.     lovastatin (MEVACOR) 40 MG tablet Take 40 mg by mouth 2 (two) times daily.     metoprolol succinate (TOPROL-XL) 25 MG 24 hr tablet Take by mouth.     Probiotic Product (PROBIOTIC ADVANCED) CAPS Take by mouth.     risedronate (ACTONEL) 35 MG tablet Take 1 tablet (35 mg total) by mouth every 14 (fourteen) days. Take 1 tablet by mouth every 14 days. 30 tablet 1   tamoxifen (NOLVADEX) 20 MG tablet TAKE 1 TABLET (20 MG TOTAL) BY MOUTH DAILY. 90 tablet 2   No current facility-administered medications for this visit.     OBJECTIVE: Vitals:   12/19/18 1114  BP: (!) 159/82  Pulse: 60  Resp: 18  Temp: 98.1 F (36.7 C)     There is no height or weight on file to calculate BMI.    ECOG FS:0 - Asymptomatic  General: Well-developed, well-nourished, no acute distress. Eyes: Pink conjunctiva, anicteric sclera. HEENT: Normocephalic, moist mucous membranes. Breast: Patient declined breast exam today. Lungs: Clear to auscultation  bilaterally. Heart: Regular rate and rhythm. No rubs, murmurs, or gallops. Abdomen: Soft, nontender, nondistended. No organomegaly noted, normoactive bowel sounds. Musculoskeletal: No edema, cyanosis, or clubbing. Neuro: Alert, answering all questions appropriately. Cranial nerves grossly intact. Skin: No rashes or petechiae noted. Psych: Normal affect.   LAB RESULTS:  Lab Results  Component Value Date   NA 138 07/28/2016   K 4.8 07/28/2016   CL 106 07/28/2016   CO2 22 07/28/2016   GLUCOSE 96 07/28/2016   BUN 39 (H) 07/28/2016   CREATININE 1.74 (H) 07/28/2016   CALCIUM 8.9 07/28/2016   GFRNONAA 27 (L) 07/28/2016   GFRAA 31 (L)  07/28/2016    Lab Results  Component Value Date   WBC 6.7 07/13/2016   NEUTROABS 5.0 05/28/2014   HGB 12.2 07/13/2016   HCT 36.9 07/13/2016   MCV 92.9 07/13/2016   PLT 178 07/13/2016     STUDIES: Dg Bone Density  Result Date: 12/17/2018 EXAM: DUAL X-RAY ABSORPTIOMETRY (DXA) FOR BONE MINERAL DENSITY IMPRESSION: Technologist: Marquette Old Your patient Rachel Jones completed a BMD test on 12/17/2018 using the [place] (analysis version: 14.10) manufactured by [employer]. The following summarizes the results of our evaluation. PATIENT BIOGRAPHICAL: Name: Carlin, Mamone Patient ID: [id number] Birth Date: December 06, 1933 Height: 63.0 in. Gender: Female Exam Date: 12/17/2018 Weight: 186.0 lbs. Indications: Caucasian, High Risk Meds, History of Radiation, Advanced Age, Hysterectomy, Postmenopausal, Height Loss, History of Breast Cancer Fractures: Treatments: Actonel, Calcium, Tamoxifen, Vitamin D ASSESSMENT: The BMD measured at Forearm Radius 33% is 0.630 g/cm2 with a T-score of -2.8. This patient is considered OSTEOPOROTIC according to [facility]) criteria. Lumbar spine was not utilized due to advanced degenerative changes. The quality of the scan is good. Site Region Measured Measured WHO Young Adult BMD Date       Age      Classification  T-score DualFemur Neck Left 12/17/2018 83.3 Osteoporosis -2.6 0.679 g/cm2 DualFemur Neck Left 12/14/2017 82.3 Osteopenia -2.3 0.722 g/cm2 DualFemur Total Mean 12/17/2018 83.3 Osteopenia -1.9 0.770 g/cm2 DualFemur Total Mean 12/14/2017 82.3 Osteopenia -1.7 0.788 g/cm2 Left Forearm Radius 33% 12/17/2018 83.3 Osteoporosis -2.8 0.630 g/cm2 Left Forearm Radius 33% 12/14/2017 82.3 Osteoporosis -3.0 0.610 g/cm2 World Health Organization [facility]) criteria for post-menopausal, Caucasian Women: Normal:       T-score at or above -1 SD Osteopenia:   T-score between -1 and -2.5 SD Osteoporosis: T-score at or below -2.5 SD RECOMMENDATIONS: 1. All patients should optimize calcium and vitamin D intake. 2. Consider FDA-approved medical therapies in postmenopausal women and men aged 69 years and older, based on the following: a. A hip or vertebral(clinical or morphometric) fracture b. T-score < -2.5 at the femoral neck or spine after appropriate evaluation to exclude secondary causes c. Low bone mass (T-score between -1.0 and -2.5 at the femoral neck or spine) and a 10-year probability of a hip fracture > 3% or a 10-year probability of a major osteoporosis-related fracture > 20% based on the [place]-adapted WHO algorithm d. Clinician judgment and/or patient preferences may indicate treatment for people with 10-year fracture probabilities above or below these levels FOLLOW-UP: People with diagnosed cases of osteoporosis or at high risk for fracture should have regular bone mineral density tests. For patients eligible for Medicare, routine testing is allowed once every 2 years. The testing frequency can be increased to one year for patients who have rapidly progressing disease, those who are receiving or discontinuing medical therapy to restore bone mass, or have additional risk factors. I have reviewed this report, and agree with the above findings. Mark A. Thornton Papas, M.D. [facility] Electronically Signed   By: Lavonia Dana M.D.   On:  12/17/2018 13:54    ASSESSMENT: Pathologic stage IA ER positive, PR HER-2 negative invasive carcinoma of the upper inner quadrant of right breast. High-risk Mammoprint.  PLAN:    1. Pathologic stage IA ER positive, PR HER-2 negative invasive carcinoma of the upper inner quadrant of right breast: Final pathology was reviewed independently. Although patient was noted to have a high risk Mammoprint, it was determined given her advanced age as well as multiple comorbidities that chemotherapy was not  in her best interest.  Patient was switched to tamoxifen given osteoporosis and is tolerating this well.  She will complete 5 years of treatment in March 2023. Her most recent mammogram on May 29, 2018 was reported as BI-RADS 2.  Repeat in January 2021.  Return to clinic in 6 months for routine evaluation. 2.  Osteoporosis: Patient's most recent bone mineral density on December 17, 2018 reported T score of -2.8 which is mildly improved when 1 year prior where she had a T score of -3.0.  Continue tamoxifen as above.  Patient has also been instructed to continue 35 mg risedronate every 2 weeks by her primary nephrologist.  Continue calcium and vitamin D supplementation.  Repeat in July 2021. 3.  DVT: Diagnosed on Sep 21, 2018.  Patient will require a minimum of 3 months of anticoagulation. 4.  Joint pain: Likely secondary to arthritis and not tamoxifen.  Monitor.  Patient expressed understanding and was in agreement with this plan. She also understands that She can call clinic at any time with any questions, concerns, or complaints.   Cancer Staging Primary cancer of upper inner quadrant of right female breast [facility]) Staging form: Breast, AJCC 7th Edition - Pathologic stage from 05/15/2016: Stage IA (T1c, N0, cM0) - Signed by Lloyd Huger, MD on 05/15/2016   Lloyd Huger, MD   12/20/2018 10:08 AM

## 2018-12-17 ENCOUNTER — Ambulatory Visit
Admission: RE | Admit: 2018-12-17 | Discharge: 2018-12-17 | Disposition: A | Discharge status: Home / Self Care | Payer: Medicare HMO | Source: Ambulatory Visit | Attending: Oncology | Admitting: Oncology

## 2018-12-17 ENCOUNTER — Other Ambulatory Visit: Payer: Self-pay

## 2018-12-17 DIAGNOSIS — C50211 Malignant neoplasm of upper-inner quadrant of right female breast: Secondary | ICD-10-CM | POA: Insufficient documentation

## 2018-12-17 DIAGNOSIS — M810 Age-related osteoporosis without current pathological fracture: Secondary | ICD-10-CM | POA: Insufficient documentation

## 2018-12-17 DIAGNOSIS — Z780 Asymptomatic menopausal state: Secondary | ICD-10-CM | POA: Diagnosis not present

## 2018-12-18 ENCOUNTER — Other Ambulatory Visit: Payer: Self-pay

## 2018-12-18 DIAGNOSIS — D630 Anemia in neoplastic disease: Secondary | ICD-10-CM | POA: Diagnosis not present

## 2018-12-18 DIAGNOSIS — R32 Unspecified urinary incontinence: Secondary | ICD-10-CM | POA: Diagnosis not present

## 2018-12-18 DIAGNOSIS — I129 Hypertensive chronic kidney disease with stage 1 through stage 4 chronic kidney disease, or unspecified chronic kidney disease: Secondary | ICD-10-CM | POA: Diagnosis not present

## 2018-12-18 DIAGNOSIS — I6522 Occlusion and stenosis of left carotid artery: Secondary | ICD-10-CM | POA: Diagnosis not present

## 2018-12-18 DIAGNOSIS — C50911 Malignant neoplasm of unspecified site of right female breast: Secondary | ICD-10-CM | POA: Diagnosis not present

## 2018-12-18 DIAGNOSIS — N184 Chronic kidney disease, stage 4 (severe): Secondary | ICD-10-CM | POA: Diagnosis not present

## 2018-12-18 DIAGNOSIS — M109 Gout, unspecified: Secondary | ICD-10-CM | POA: Diagnosis not present

## 2018-12-18 DIAGNOSIS — M0579 Rheumatoid arthritis with rheumatoid factor of multiple sites without organ or systems involvement: Secondary | ICD-10-CM | POA: Diagnosis not present

## 2018-12-18 DIAGNOSIS — K219 Gastro-esophageal reflux disease without esophagitis: Secondary | ICD-10-CM | POA: Diagnosis not present

## 2018-12-19 ENCOUNTER — Other Ambulatory Visit: Payer: Self-pay

## 2018-12-19 ENCOUNTER — Inpatient Hospital Stay: Payer: Medicare HMO | Attending: Oncology | Admitting: Oncology

## 2018-12-19 ENCOUNTER — Encounter: Payer: Self-pay | Admitting: Oncology

## 2018-12-19 VITALS — BP 159/82 | HR 60 | Temp 98.1°F | Resp 18

## 2018-12-19 DIAGNOSIS — Z923 Personal history of irradiation: Secondary | ICD-10-CM | POA: Insufficient documentation

## 2018-12-19 DIAGNOSIS — Z79810 Long term (current) use of selective estrogen receptor modulators (SERMs): Secondary | ICD-10-CM | POA: Diagnosis not present

## 2018-12-19 DIAGNOSIS — M810 Age-related osteoporosis without current pathological fracture: Secondary | ICD-10-CM | POA: Insufficient documentation

## 2018-12-19 DIAGNOSIS — Z86718 Personal history of other venous thrombosis and embolism: Secondary | ICD-10-CM | POA: Diagnosis not present

## 2018-12-19 DIAGNOSIS — Z170 Estrogen receptor positive status [ER+]: Secondary | ICD-10-CM | POA: Diagnosis not present

## 2018-12-19 DIAGNOSIS — Z79899 Other long term (current) drug therapy: Secondary | ICD-10-CM | POA: Diagnosis not present

## 2018-12-19 DIAGNOSIS — Z9011 Acquired absence of right breast and nipple: Secondary | ICD-10-CM | POA: Insufficient documentation

## 2018-12-19 DIAGNOSIS — D630 Anemia in neoplastic disease: Secondary | ICD-10-CM | POA: Diagnosis not present

## 2018-12-19 DIAGNOSIS — R609 Edema, unspecified: Secondary | ICD-10-CM | POA: Diagnosis not present

## 2018-12-19 DIAGNOSIS — M109 Gout, unspecified: Secondary | ICD-10-CM | POA: Diagnosis not present

## 2018-12-19 DIAGNOSIS — M2550 Pain in unspecified joint: Secondary | ICD-10-CM | POA: Insufficient documentation

## 2018-12-19 DIAGNOSIS — R0602 Shortness of breath: Secondary | ICD-10-CM | POA: Diagnosis not present

## 2018-12-19 DIAGNOSIS — I6522 Occlusion and stenosis of left carotid artery: Secondary | ICD-10-CM | POA: Diagnosis not present

## 2018-12-19 DIAGNOSIS — M0579 Rheumatoid arthritis with rheumatoid factor of multiple sites without organ or systems involvement: Secondary | ICD-10-CM | POA: Diagnosis not present

## 2018-12-19 DIAGNOSIS — C50211 Malignant neoplasm of upper-inner quadrant of right female breast: Secondary | ICD-10-CM | POA: Diagnosis not present

## 2018-12-19 DIAGNOSIS — I129 Hypertensive chronic kidney disease with stage 1 through stage 4 chronic kidney disease, or unspecified chronic kidney disease: Secondary | ICD-10-CM | POA: Insufficient documentation

## 2018-12-19 DIAGNOSIS — K219 Gastro-esophageal reflux disease without esophagitis: Secondary | ICD-10-CM | POA: Diagnosis not present

## 2018-12-19 DIAGNOSIS — I2510 Atherosclerotic heart disease of native coronary artery without angina pectoris: Secondary | ICD-10-CM

## 2018-12-19 DIAGNOSIS — Z7901 Long term (current) use of anticoagulants: Secondary | ICD-10-CM

## 2018-12-19 DIAGNOSIS — R32 Unspecified urinary incontinence: Secondary | ICD-10-CM | POA: Diagnosis not present

## 2018-12-19 DIAGNOSIS — N184 Chronic kidney disease, stage 4 (severe): Secondary | ICD-10-CM | POA: Diagnosis not present

## 2018-12-19 DIAGNOSIS — D649 Anemia, unspecified: Secondary | ICD-10-CM | POA: Diagnosis not present

## 2018-12-19 DIAGNOSIS — C50911 Malignant neoplasm of unspecified site of right female breast: Secondary | ICD-10-CM | POA: Diagnosis not present

## 2018-12-19 NOTE — Progress Notes (Signed)
Pt here for follow up. Pt states has had a few falls over the past few months due to lower extremity weakness. Pt is receiving PT with home health services. Also, pt is having joint pain and specifically has more pain in right hip.

## 2018-12-21 DIAGNOSIS — R32 Unspecified urinary incontinence: Secondary | ICD-10-CM | POA: Diagnosis not present

## 2018-12-21 DIAGNOSIS — K219 Gastro-esophageal reflux disease without esophagitis: Secondary | ICD-10-CM | POA: Diagnosis not present

## 2018-12-21 DIAGNOSIS — I129 Hypertensive chronic kidney disease with stage 1 through stage 4 chronic kidney disease, or unspecified chronic kidney disease: Secondary | ICD-10-CM | POA: Diagnosis not present

## 2018-12-21 DIAGNOSIS — M0579 Rheumatoid arthritis with rheumatoid factor of multiple sites without organ or systems involvement: Secondary | ICD-10-CM | POA: Diagnosis not present

## 2018-12-21 DIAGNOSIS — D630 Anemia in neoplastic disease: Secondary | ICD-10-CM | POA: Diagnosis not present

## 2018-12-21 DIAGNOSIS — M109 Gout, unspecified: Secondary | ICD-10-CM | POA: Diagnosis not present

## 2018-12-21 DIAGNOSIS — N184 Chronic kidney disease, stage 4 (severe): Secondary | ICD-10-CM | POA: Diagnosis not present

## 2018-12-21 DIAGNOSIS — I6522 Occlusion and stenosis of left carotid artery: Secondary | ICD-10-CM | POA: Diagnosis not present

## 2018-12-21 DIAGNOSIS — C50911 Malignant neoplasm of unspecified site of right female breast: Secondary | ICD-10-CM | POA: Diagnosis not present

## 2018-12-24 DIAGNOSIS — I10 Essential (primary) hypertension: Secondary | ICD-10-CM | POA: Diagnosis not present

## 2018-12-24 DIAGNOSIS — Z79899 Other long term (current) drug therapy: Secondary | ICD-10-CM | POA: Diagnosis not present

## 2018-12-26 DIAGNOSIS — R32 Unspecified urinary incontinence: Secondary | ICD-10-CM | POA: Diagnosis not present

## 2018-12-26 DIAGNOSIS — K219 Gastro-esophageal reflux disease without esophagitis: Secondary | ICD-10-CM | POA: Diagnosis not present

## 2018-12-26 DIAGNOSIS — I6522 Occlusion and stenosis of left carotid artery: Secondary | ICD-10-CM | POA: Diagnosis not present

## 2018-12-26 DIAGNOSIS — N184 Chronic kidney disease, stage 4 (severe): Secondary | ICD-10-CM | POA: Diagnosis not present

## 2018-12-26 DIAGNOSIS — I129 Hypertensive chronic kidney disease with stage 1 through stage 4 chronic kidney disease, or unspecified chronic kidney disease: Secondary | ICD-10-CM | POA: Diagnosis not present

## 2018-12-26 DIAGNOSIS — D630 Anemia in neoplastic disease: Secondary | ICD-10-CM | POA: Diagnosis not present

## 2018-12-26 DIAGNOSIS — M109 Gout, unspecified: Secondary | ICD-10-CM | POA: Diagnosis not present

## 2018-12-26 DIAGNOSIS — M0579 Rheumatoid arthritis with rheumatoid factor of multiple sites without organ or systems involvement: Secondary | ICD-10-CM | POA: Diagnosis not present

## 2018-12-26 DIAGNOSIS — C50911 Malignant neoplasm of unspecified site of right female breast: Secondary | ICD-10-CM | POA: Diagnosis not present

## 2018-12-27 DIAGNOSIS — D630 Anemia in neoplastic disease: Secondary | ICD-10-CM | POA: Diagnosis not present

## 2018-12-27 DIAGNOSIS — I6522 Occlusion and stenosis of left carotid artery: Secondary | ICD-10-CM | POA: Diagnosis not present

## 2018-12-27 DIAGNOSIS — C50911 Malignant neoplasm of unspecified site of right female breast: Secondary | ICD-10-CM | POA: Diagnosis not present

## 2018-12-27 DIAGNOSIS — K219 Gastro-esophageal reflux disease without esophagitis: Secondary | ICD-10-CM | POA: Diagnosis not present

## 2018-12-27 DIAGNOSIS — I129 Hypertensive chronic kidney disease with stage 1 through stage 4 chronic kidney disease, or unspecified chronic kidney disease: Secondary | ICD-10-CM | POA: Diagnosis not present

## 2018-12-27 DIAGNOSIS — M109 Gout, unspecified: Secondary | ICD-10-CM | POA: Diagnosis not present

## 2018-12-27 DIAGNOSIS — N184 Chronic kidney disease, stage 4 (severe): Secondary | ICD-10-CM | POA: Diagnosis not present

## 2018-12-27 DIAGNOSIS — M0579 Rheumatoid arthritis with rheumatoid factor of multiple sites without organ or systems involvement: Secondary | ICD-10-CM | POA: Diagnosis not present

## 2018-12-27 DIAGNOSIS — R32 Unspecified urinary incontinence: Secondary | ICD-10-CM | POA: Diagnosis not present

## 2018-12-31 DIAGNOSIS — M0579 Rheumatoid arthritis with rheumatoid factor of multiple sites without organ or systems involvement: Secondary | ICD-10-CM | POA: Diagnosis not present

## 2018-12-31 DIAGNOSIS — N184 Chronic kidney disease, stage 4 (severe): Secondary | ICD-10-CM | POA: Diagnosis not present

## 2018-12-31 DIAGNOSIS — E782 Mixed hyperlipidemia: Secondary | ICD-10-CM | POA: Diagnosis not present

## 2018-12-31 DIAGNOSIS — I10 Essential (primary) hypertension: Secondary | ICD-10-CM | POA: Diagnosis not present

## 2018-12-31 DIAGNOSIS — R0602 Shortness of breath: Secondary | ICD-10-CM | POA: Diagnosis not present

## 2019-01-16 DIAGNOSIS — H524 Presbyopia: Secondary | ICD-10-CM | POA: Diagnosis not present

## 2019-01-16 DIAGNOSIS — H34831 Tributary (branch) retinal vein occlusion, right eye, with macular edema: Secondary | ICD-10-CM | POA: Diagnosis not present

## 2019-01-29 DIAGNOSIS — H34831 Tributary (branch) retinal vein occlusion, right eye, with macular edema: Secondary | ICD-10-CM | POA: Diagnosis not present

## 2019-02-06 ENCOUNTER — Other Ambulatory Visit: Payer: Self-pay | Admitting: Internal Medicine

## 2019-02-06 ENCOUNTER — Ambulatory Visit
Admission: RE | Admit: 2019-02-06 | Discharge: 2019-02-06 | Disposition: A | Discharge status: Home / Self Care | Payer: Medicare HMO | Source: Ambulatory Visit | Attending: Internal Medicine | Admitting: Internal Medicine

## 2019-02-06 ENCOUNTER — Other Ambulatory Visit: Payer: Self-pay

## 2019-02-06 DIAGNOSIS — I825Y1 Chronic embolism and thrombosis of unspecified deep veins of right proximal lower extremity: Secondary | ICD-10-CM | POA: Diagnosis not present

## 2019-02-06 DIAGNOSIS — M79604 Pain in right leg: Secondary | ICD-10-CM | POA: Diagnosis not present

## 2019-02-06 DIAGNOSIS — Z7901 Long term (current) use of anticoagulants: Secondary | ICD-10-CM | POA: Diagnosis not present

## 2019-02-06 DIAGNOSIS — Z86718 Personal history of other venous thrombosis and embolism: Secondary | ICD-10-CM | POA: Insufficient documentation

## 2019-02-06 DIAGNOSIS — M7989 Other specified soft tissue disorders: Secondary | ICD-10-CM | POA: Diagnosis not present

## 2019-02-06 DIAGNOSIS — I825Y9 Chronic embolism and thrombosis of unspecified deep veins of unspecified proximal lower extremity: Secondary | ICD-10-CM | POA: Insufficient documentation

## 2019-02-06 DIAGNOSIS — M79661 Pain in right lower leg: Secondary | ICD-10-CM | POA: Diagnosis not present

## 2019-02-13 DIAGNOSIS — M25551 Pain in right hip: Secondary | ICD-10-CM | POA: Diagnosis not present

## 2019-02-13 DIAGNOSIS — M1611 Unilateral primary osteoarthritis, right hip: Secondary | ICD-10-CM | POA: Diagnosis not present

## 2019-03-11 ENCOUNTER — Ambulatory Visit: Payer: Medicare HMO | Admitting: Radiation Oncology

## 2019-03-14 ENCOUNTER — Other Ambulatory Visit: Payer: Self-pay | Admitting: Oncology

## 2019-03-27 DIAGNOSIS — M79604 Pain in right leg: Secondary | ICD-10-CM | POA: Diagnosis not present

## 2019-03-27 DIAGNOSIS — I10 Essential (primary) hypertension: Secondary | ICD-10-CM | POA: Diagnosis not present

## 2019-03-27 DIAGNOSIS — I825Y1 Chronic embolism and thrombosis of unspecified deep veins of right proximal lower extremity: Secondary | ICD-10-CM | POA: Diagnosis not present

## 2019-03-27 DIAGNOSIS — E782 Mixed hyperlipidemia: Secondary | ICD-10-CM | POA: Diagnosis not present

## 2019-03-27 DIAGNOSIS — N184 Chronic kidney disease, stage 4 (severe): Secondary | ICD-10-CM | POA: Diagnosis not present

## 2019-03-27 DIAGNOSIS — Z79899 Other long term (current) drug therapy: Secondary | ICD-10-CM | POA: Diagnosis not present

## 2019-03-27 DIAGNOSIS — R8290 Unspecified abnormal findings in urine: Secondary | ICD-10-CM | POA: Diagnosis not present

## 2019-03-27 DIAGNOSIS — R0602 Shortness of breath: Secondary | ICD-10-CM | POA: Diagnosis not present

## 2019-03-27 DIAGNOSIS — Z23 Encounter for immunization: Secondary | ICD-10-CM | POA: Diagnosis not present

## 2019-04-02 DIAGNOSIS — H34831 Tributary (branch) retinal vein occlusion, right eye, with macular edema: Secondary | ICD-10-CM | POA: Diagnosis not present

## 2019-04-08 DIAGNOSIS — I825Y1 Chronic embolism and thrombosis of unspecified deep veins of right proximal lower extremity: Secondary | ICD-10-CM | POA: Diagnosis not present

## 2019-04-08 DIAGNOSIS — J189 Pneumonia, unspecified organism: Secondary | ICD-10-CM | POA: Diagnosis not present

## 2019-04-08 DIAGNOSIS — I779 Disorder of arteries and arterioles, unspecified: Secondary | ICD-10-CM | POA: Diagnosis not present

## 2019-04-08 DIAGNOSIS — G4733 Obstructive sleep apnea (adult) (pediatric): Secondary | ICD-10-CM | POA: Diagnosis not present

## 2019-04-08 DIAGNOSIS — M1611 Unilateral primary osteoarthritis, right hip: Secondary | ICD-10-CM | POA: Diagnosis not present

## 2019-04-08 DIAGNOSIS — M25451 Effusion, right hip: Secondary | ICD-10-CM | POA: Diagnosis not present

## 2019-04-08 DIAGNOSIS — I6522 Occlusion and stenosis of left carotid artery: Secondary | ICD-10-CM | POA: Diagnosis not present

## 2019-04-08 DIAGNOSIS — I10 Essential (primary) hypertension: Secondary | ICD-10-CM | POA: Diagnosis not present

## 2019-04-08 DIAGNOSIS — M25551 Pain in right hip: Secondary | ICD-10-CM | POA: Diagnosis not present

## 2019-04-11 ENCOUNTER — Other Ambulatory Visit: Payer: Self-pay

## 2019-04-11 ENCOUNTER — Ambulatory Visit (INDEPENDENT_AMBULATORY_CARE_PROVIDER_SITE_OTHER): Payer: Medicare HMO

## 2019-04-11 ENCOUNTER — Ambulatory Visit (INDEPENDENT_AMBULATORY_CARE_PROVIDER_SITE_OTHER): Payer: Medicare HMO | Admitting: Nurse Practitioner

## 2019-04-11 ENCOUNTER — Encounter (INDEPENDENT_AMBULATORY_CARE_PROVIDER_SITE_OTHER): Payer: Self-pay | Admitting: Nurse Practitioner

## 2019-04-11 VITALS — BP 148/81 | HR 66 | Resp 20 | Ht 62.0 in | Wt 184.0 lb

## 2019-04-11 DIAGNOSIS — I6523 Occlusion and stenosis of bilateral carotid arteries: Secondary | ICD-10-CM | POA: Diagnosis not present

## 2019-04-11 DIAGNOSIS — K219 Gastro-esophageal reflux disease without esophagitis: Secondary | ICD-10-CM | POA: Diagnosis not present

## 2019-04-14 ENCOUNTER — Encounter (INDEPENDENT_AMBULATORY_CARE_PROVIDER_SITE_OTHER): Payer: Self-pay | Admitting: Nurse Practitioner

## 2019-04-14 NOTE — Progress Notes (Signed)
SUBJECTIVE:  Patient ID: Rachel Jones, female    DOB: 18-Nov-1933, 85 y.o.   MRN: [id number] Chief Complaint  Patient presents with  . Follow-up    HPI  Rachel Jones is a 85 y.o. female The patient is seen for follow up evaluation of carotid stenosis. The carotid stenosis followed by ultrasound.   The patient denies amaurosis fugax. There is no recent history of TIA symptoms or focal motor deficits. There is no prior documented CVA.  The patient is taking enteric-coated aspirin 81 mg daily.  There is no history of migraine headaches. There is no history of seizures.  The patient has a history of coronary artery disease, no recent episodes of angina or shortness of breath. The patient denies PAD or claudication symptoms. There is a history of hyperlipidemia which is being treated with a statin.    Carotid Duplex done today shows 1-39% stenosis bilaterally.  No change compared to last study on 04/09/2018. Left stent patent  Past Medical History:  Diagnosis Date  . Anemia    is followed by renal doctor and pcp  . Arthritis   . Breast cancer ([place]) 04/2016   Rt. breast ca, f/u with radiation  . Cancer [facility]) 1961   uterine cancer  . Coronary artery disease   . Dyspnea   . GERD (gastroesophageal reflux disease)   . Hypertension   . Kidney disease    stage 4.  followed by dr. Sedonia Small  . Lower extremity edema   . Personal history of radiation therapy   . S/P lumpectomy, right breast    having done on 04/26/16  . Sleep apnea    uses cpap    Past Surgical History:  Procedure Laterality Date  . BREAST BIOPSY Right 03/2016   invasive mammary carcinoma  . BREAST LUMPECTOMY Right    04/26/16, f/u with radiation  . CAROTID STENT Left 2016   was having pain down neck. carotid artery clogged. dr. Delana Meyer put stent in  . CAROTID STENT Left   . CATARACT EXTRACTION W/ INTRAOCULAR LENS  IMPLANT, BILATERAL Bilateral 2009  . EYE SURGERY  2009   cataracts  . PARTIAL HYSTERECTOMY   1961   uterus only removed  . PARTIAL MASTECTOMY WITH NEEDLE LOCALIZATION Right 04/26/2016   Procedure: PARTIAL MASTECTOMY WITH NEEDLE LOCALIZATION;  Surgeon: Leonie Green, MD;  Location: [facility] ORS;  Service: General;  Laterality: Right;  . SENTINEL NODE BIOPSY Right 04/26/2016   Procedure: SENTINEL NODE BIOPSY;  Surgeon: Leonie Green, MD;  Location: [facility] ORS;  Service: General;  Laterality: Right;    Social History   Socioeconomic History  . Marital status: Married    Spouse name: Not on file  . Number of children: Not on file  . Years of education: Not on file  . Highest education level: Not on file  Occupational History  . Not on file  Social Needs  . Financial resource strain: Not on file  . Food insecurity    Worry: Not on file    Inability: Not on file  . Transportation needs    Medical: Not on file    Non-medical: Not on file  Tobacco Use  . Smoking status: Never Smoker  . Smokeless tobacco: Never Used  Substance and Sexual Activity  . Alcohol use: No  . Drug use: No  . Sexual activity: Never  Lifestyle  . Physical activity    Days per week: Not on file    Minutes per session:  Not on file  . Stress: Not on file  Relationships  . Social Herbalist on phone: Not on file    Gets together: Not on file    Attends religious service: Not on file    Active member of club or organization: Not on file    Attends meetings of clubs or organizations: Not on file    Relationship status: Not on file  . Intimate partner violence    Fear of current or ex partner: Not on file    Emotionally abused: Not on file    Physically abused: Not on file    Forced sexual activity: Not on file  Other Topics Concern  . Not on file  Social History Narrative  . Not on file    Family History  Problem Relation Age of Onset  . Bladder Cancer Neg Hx   . Kidney cancer Neg Hx   . Prolactinoma Neg Hx   . Prostate cancer Neg Hx     Allergies  Allergen Reactions   . Cefuroxime Axetil Diarrhea  . Codeine Nausea Only    Intolerance instead of true allerg  . Sulfa Antibiotics Swelling and Rash    swelling     Review of Systems   Review of Systems: Negative Unless Checked Constitutional: [] Weight loss  [] Fever  [] Chills Cardiac: [] Chest pain   []  Atrial Fibrillation  [] Palpitations   [] Shortness of breath when laying flat   [] Shortness of breath with exertion. [] Shortness of breath at rest Vascular:  [] Pain in legs with walking   [] Pain in legs with standing [] Pain in legs when laying flat   [] Claudication    [] Pain in feet when laying flat    [] History of DVT   [] Phlebitis   [x] Swelling in legs   [] Varicose veins   [] Non-healing ulcers Pulmonary:   [] Uses home oxygen   [] Productive cough   [] Hemoptysis   [] Wheeze  [] COPD   [] Asthma Neurologic:  [] Dizziness   [] Seizures  [] Blackouts [] History of stroke   [] History of TIA  [] Aphasia   [] Temporary Blindness   [] Weakness or numbness in arm   [] Weakness or numbness in leg Musculoskeletal:   [] Joint swelling   [] Joint pain   [] Low back pain  []  History of Knee Replacement [x] Arthritis [] back Surgeries  []  Spinal Stenosis    Hematologic:  [] Easy bruising  [] Easy bleeding   [] Hypercoagulable state   [x] Anemic Gastrointestinal:  [] Diarrhea   [] Vomiting  [] Gastroesophageal reflux/heartburn   [] Difficulty swallowing. [] Abdominal pain Genitourinary:  [x] Chronic kidney disease   [] Difficult urination  [] Anuric   [] Blood in urine [] Frequent urination  [] Burning with urination   [] Hematuria Skin:  [] Rashes   [] Ulcers [] Wounds Psychological:  [] History of anxiety   []  History of major depression  []  Memory Difficulties      OBJECTIVE:   Physical Exam  BP (!) 148/81 (BP Location: Left Arm)   Pulse 66   Resp 20   Ht 5\' 2"  (1.575 m)   Wt 184 lb (83.5 kg)   BMI 33.65 kg/m   Gen: WD/WN, NAD Head: [place]/AT, No temporalis wasting.  Ear/Nose/Throat: Hearing grossly intact, nares w/o erythema or drainage Eyes: PER,  EOMI, sclera nonicteric.  Neck: Supple, no masses.  No JVD.  Pulmonary:  Good air movement, no use of accessory muscles.  Cardiac: RRR Vascular: no bruit present, 2+ edema bilaterally Vessel Right Left  Radial Palpable Palpable   Gastrointestinal: soft, non-distended. No guarding/no peritoneal signs.  Musculoskeletal: M/S 5/5 throughout.  No deformity or atrophy.  Neurologic: Pain and light touch intact in extremities.  Symmetrical.  Speech is fluent. Motor exam as listed above. Psychiatric: Judgment intact, Mood & affect appropriate for pt's clinical situation. Dermatologic: No Venous rashes. No Ulcers Noted.  No changes consistent with cellulitis. Lymph : No Cervical lymphadenopathy, no lichenification or skin changes of chronic lymphedema.       ASSESSMENT AND PLAN:  1. Bilateral carotid artery stenosis Recommend:  Given the patient's asymptomatic subcritical stenosis no further invasive testing or surgery at this time.  Duplex ultrasound shows 1-39% stenosis bilaterally.  Continue antiplatelet therapy as prescribed Continue management of CAD, HTN and Hyperlipidemia Healthy heart diet,  encouraged exercise at least 4 times per week Follow up in 12 months with duplex ultrasound and physical exam  - VAS US CAROTID; Future  2. Gastroesophageal reflux disease without esophagitis Continue PPI as already ordered, this medication has been reviewed and there are no changes at this time.  Avoidence of caffeine and alcohol  Moderate elevation of the head of the bed    Current Outpatient Medications on File Prior to Visit  Medication Sig Dispense Refill  . acetaminophen (TYLENOL) 500 MG tablet Take 1,000 mg by mouth daily. May take an additional 1000 mgs as needed for pain    . alendronate (FOSAMAX) 70 MG tablet Take 1 tablet (70 mg total) by mouth once a week. Take with a full glass of water on an empty stomach. 30 tablet 0  . apixaban (ELIQUIS) 2.5 MG TABS tablet Take 1 tablet  by mouth 2 (two) times a day.    . calcium-vitamin D (OSCAL WITH D) 500-200 MG-UNIT tablet Take 1 tablet by mouth 2 (two) times daily. 180 tablet 3  . Cholecalciferol (VITAMIN D3) 1000 units CAPS Take 1,000 Units by mouth daily.     . Cranberry 500 MG TABS Take by mouth 2 (two) times daily.    . Cyanocobalamin (VITAMIN B-12 IJ) Inject 1,000 mcg as directed every 30 (thirty) days.    Marland Kitchen docusate sodium (STOOL SOFTENER) 100 MG capsule Take 100 mg by mouth 2 (two) times daily.    Marland Kitchen esomeprazole (NEXIUM) 40 MG capsule Take 40 mg by mouth daily.    . ferrous gluconate (FERGON) 324 MG tablet Take 324 mg by mouth daily.    . furosemide (LASIX) 20 MG tablet Take 20 mg by mouth daily as needed for edema.     . lovastatin (MEVACOR) 40 MG tablet Take 40 mg by mouth 2 (two) times daily.    . metoprolol succinate (TOPROL-XL) 25 MG 24 hr tablet Take by mouth.    . Probiotic Product (PROBIOTIC ADVANCED) CAPS Take by mouth.    . risedronate (ACTONEL) 35 MG tablet TAKE 1 TABLET EVERY 14 DAYS 4 tablet 6  . tamoxifen (NOLVADEX) 20 MG tablet TAKE 1 TABLET (20 MG TOTAL) BY MOUTH DAILY. 90 tablet 2  . losartan (COZAAR) 50 MG tablet Take 50 mg by mouth daily.     No current facility-administered medications on file prior to visit.     There are no Patient Instructions on file for this visit. No follow-ups on file.   Kris Hartmann, NP  This note was completed with Sales executive.  Any errors are purely unintentional.

## 2019-05-01 DIAGNOSIS — N184 Chronic kidney disease, stage 4 (severe): Secondary | ICD-10-CM | POA: Diagnosis not present

## 2019-05-01 DIAGNOSIS — E875 Hyperkalemia: Secondary | ICD-10-CM | POA: Diagnosis not present

## 2019-05-01 DIAGNOSIS — R809 Proteinuria, unspecified: Secondary | ICD-10-CM | POA: Diagnosis not present

## 2019-05-01 DIAGNOSIS — I129 Hypertensive chronic kidney disease with stage 1 through stage 4 chronic kidney disease, or unspecified chronic kidney disease: Secondary | ICD-10-CM | POA: Diagnosis not present

## 2019-05-01 DIAGNOSIS — N2581 Secondary hyperparathyroidism of renal origin: Secondary | ICD-10-CM | POA: Diagnosis not present

## 2019-05-01 DIAGNOSIS — D631 Anemia in chronic kidney disease: Secondary | ICD-10-CM | POA: Diagnosis not present

## 2019-05-06 DIAGNOSIS — N2581 Secondary hyperparathyroidism of renal origin: Secondary | ICD-10-CM | POA: Insufficient documentation

## 2019-05-06 DIAGNOSIS — I129 Hypertensive chronic kidney disease with stage 1 through stage 4 chronic kidney disease, or unspecified chronic kidney disease: Secondary | ICD-10-CM | POA: Insufficient documentation

## 2019-05-06 DIAGNOSIS — R809 Proteinuria, unspecified: Secondary | ICD-10-CM | POA: Insufficient documentation

## 2019-05-06 DIAGNOSIS — N184 Chronic kidney disease, stage 4 (severe): Secondary | ICD-10-CM | POA: Diagnosis not present

## 2019-05-22 DIAGNOSIS — R21 Rash and other nonspecific skin eruption: Secondary | ICD-10-CM | POA: Diagnosis not present

## 2019-05-29 DIAGNOSIS — J40 Bronchitis, not specified as acute or chronic: Secondary | ICD-10-CM | POA: Diagnosis not present

## 2019-05-29 DIAGNOSIS — I825Y1 Chronic embolism and thrombosis of unspecified deep veins of right proximal lower extremity: Secondary | ICD-10-CM | POA: Diagnosis not present

## 2019-05-29 DIAGNOSIS — Z7901 Long term (current) use of anticoagulants: Secondary | ICD-10-CM | POA: Diagnosis not present

## 2019-05-29 DIAGNOSIS — R21 Rash and other nonspecific skin eruption: Secondary | ICD-10-CM | POA: Diagnosis not present

## 2019-05-31 ENCOUNTER — Ambulatory Visit
Admission: RE | Admit: 2019-05-31 | Discharge: 2019-05-31 | Disposition: A | Discharge status: Home / Self Care | Payer: Medicare HMO | Source: Ambulatory Visit | Attending: Oncology | Admitting: Oncology

## 2019-05-31 DIAGNOSIS — R921 Mammographic calcification found on diagnostic imaging of breast: Secondary | ICD-10-CM | POA: Diagnosis not present

## 2019-05-31 DIAGNOSIS — R928 Other abnormal and inconclusive findings on diagnostic imaging of breast: Secondary | ICD-10-CM | POA: Diagnosis not present

## 2019-05-31 DIAGNOSIS — C50211 Malignant neoplasm of upper-inner quadrant of right female breast: Secondary | ICD-10-CM | POA: Diagnosis not present

## 2019-06-04 DIAGNOSIS — H34831 Tributary (branch) retinal vein occlusion, right eye, with macular edema: Secondary | ICD-10-CM | POA: Diagnosis not present

## 2019-06-27 ENCOUNTER — Ambulatory Visit: Payer: Medicare HMO | Admitting: Oncology

## 2019-06-28 DIAGNOSIS — D508 Other iron deficiency anemias: Secondary | ICD-10-CM | POA: Diagnosis not present

## 2019-06-28 DIAGNOSIS — Z79899 Other long term (current) drug therapy: Secondary | ICD-10-CM | POA: Diagnosis not present

## 2019-06-28 DIAGNOSIS — N184 Chronic kidney disease, stage 4 (severe): Secondary | ICD-10-CM | POA: Diagnosis not present

## 2019-06-28 DIAGNOSIS — I129 Hypertensive chronic kidney disease with stage 1 through stage 4 chronic kidney disease, or unspecified chronic kidney disease: Secondary | ICD-10-CM | POA: Diagnosis not present

## 2019-06-28 NOTE — Progress Notes (Signed)
[place]  Telephone:([phone]) [phone] Fax:([phone]) [phone]  ID: Rachel Jones OB: 05-01-1934  MR#: [id number]  MPN#:[id number]  Patient Care Team: Idelle Crouch, MD as PCP - General (Internal Medicine)  I connected with Rachel Jones on 07/01/19 at  2:45 PM EST by video enabled telemedicine visit and verified that I am speaking with the correct person using two identifiers.   I discussed the limitations, risks, security and privacy concerns of performing an evaluation and management service by telemedicine and the availability of in-person appointments. I also discussed with the patient that there may be a patient responsible charge related to this service. The patient expressed understanding and agreed to proceed.   Other persons participating in the visit and their role in the encounter: Patient, patient's daughter, MD.  Patient's location: Home. Provider's location: Clinic.  CHIEF COMPLAINT: Pathologic stage IA ER positive, PR, HER-2 negative invasive carcinoma of the upper inner quadrant of right breast.  INTERVAL HISTORY: Patient agreed to video enabled telemedicine visit for routine 74-monthevaluation.  She continues to tolerate tamoxifen well without significant side effects.  He has no neurologic complaints.  She denies any recent fevers or illnesses.  She has a good appetite and denies weight loss.  She has no chest pain, shortness of breath, cough, or hemoptysis.  She denies any nausea, vomiting, constipation, or diarrhea. She has no urinary complaints.  Patient offers no specific complaints today.  REVIEW OF SYSTEMS:   Review of Systems  Constitutional: Negative.  Negative for fever, malaise/fatigue and weight loss.  Respiratory: Negative.  Negative for cough, hemoptysis and shortness of breath.   Cardiovascular: Negative.  Negative for chest pain and leg swelling.  Gastrointestinal: Negative.  Negative for abdominal pain and constipation.  Genitourinary:  Negative.  Negative for dysuria.  Musculoskeletal: Negative.  Negative for joint pain and myalgias.  Skin: Negative.  Negative for rash.  Neurological: Negative.  Negative for sensory change, focal weakness, weakness and headaches.  Psychiatric/Behavioral: Negative.  The patient is not nervous/anxious.     As per HPI. Otherwise, a complete review of systems is negative.  PAST MEDICAL HISTORY: Past Medical History:  Diagnosis Date  . Anemia    is followed by renal doctor and pcp  . Arthritis   . [place]   Rt. breast ca, f/u with radiation  . Cancer ([facility] 1961   uterine cancer  . Coronary artery disease   . Dyspnea   . GERD (gastroesophageal reflux disease)   . Hypertension   . Kidney disease    stage 4.  followed by dr. kSedonia Small . Lower extremity edema   . Personal history of radiation therapy   . S/P lumpectomy, right breast    having done on 04/26/16  . Sleep apnea    uses cpap    PAST SURGICAL HISTORY: Past Surgical History:  Procedure Laterality Date  . BREAST BIOPSY Right 03/2016   invasive mammary carcinoma  . BREAST LUMPECTOMY Right    04/26/16, f/u with radiation  . CAROTID STENT Left 2016   was having pain down neck. carotid artery clogged. dr. sDelana Meyerput stent in  . CAROTID STENT Left   . CATARACT EXTRACTION W/ INTRAOCULAR LENS  IMPLANT, BILATERAL Bilateral 2009  . EYE SURGERY  2009   cataracts  . PARTIAL HYSTERECTOMY  1961   uterus only removed  . PARTIAL MASTECTOMY WITH NEEDLE LOCALIZATION Right 04/26/2016   Procedure: PARTIAL MASTECTOMY WITH NEEDLE LOCALIZATION;  Surgeon: JHillery Aldo  Tamala Julian, MD;  Location: [facility] ORS;  Service: General;  Laterality: Right;  . SENTINEL NODE BIOPSY Right 04/26/2016   Procedure: SENTINEL NODE BIOPSY;  Surgeon: Leonie Green, MD;  Location: [facility] ORS;  Service: General;  Laterality: Right;    FAMILY HISTORY: Family History  Problem Relation Age of Onset  . Bladder Cancer Neg Hx   . Kidney cancer  Neg Hx   . Prolactinoma Neg Hx   . Prostate cancer Neg Hx     ADVANCED DIRECTIVES (Y/N):  N  HEALTH MAINTENANCE: Social History   Tobacco Use  . Smoking status: Never Smoker  . Smokeless tobacco: Never Used  Substance Use Topics  . Alcohol use: No  . Drug use: No     Colonoscopy:  PAP:  Bone density:  Lipid panel:  Allergies  Allergen Reactions  . Cefuroxime Axetil Diarrhea  . Codeine Nausea Only    Intolerance instead of true allerg  . Sulfa Antibiotics Swelling and Rash    swelling    Current Outpatient Medications  Medication Sig Dispense Refill  . acetaminophen (TYLENOL) 500 MG tablet Take 1,000 mg by mouth daily. May take an additional 1000 mgs as needed for pain    . alendronate (FOSAMAX) 70 MG tablet Take 1 tablet (70 mg total) by mouth once a week. Take with a full glass of water on an empty stomach. 30 tablet 0  . apixaban (ELIQUIS) 2.5 MG TABS tablet Take 1 tablet by mouth 2 (two) times a day.    . calcium-vitamin D (OSCAL WITH D) 500-200 MG-UNIT tablet Take 1 tablet by mouth 2 (two) times daily. 180 tablet 3  . Cholecalciferol (VITAMIN D3) 1000 units CAPS Take 1,000 Units by mouth daily.     . Cranberry 500 MG TABS Take by mouth 2 (two) times daily.    . Cyanocobalamin (VITAMIN B-12 IJ) Inject 1,000 mcg as directed every 30 (thirty) days.    Marland Kitchen docusate sodium (STOOL SOFTENER) 100 MG capsule Take 100 mg by mouth 2 (two) times daily.    Marland Kitchen esomeprazole (NEXIUM) 40 MG capsule Take 40 mg by mouth daily.    . ferrous gluconate (FERGON) 324 MG tablet Take 324 mg by mouth daily.    . furosemide (LASIX) 20 MG tablet Take 20 mg by mouth daily as needed for edema.     Marland Kitchen losartan (COZAAR) 50 MG tablet Take 50 mg by mouth daily.    Marland Kitchen lovastatin (MEVACOR) 40 MG tablet Take 40 mg by mouth 2 (two) times daily.    . metoprolol succinate (TOPROL-XL) 25 MG 24 hr tablet Take by mouth.    . Probiotic Product (PROBIOTIC ADVANCED) CAPS Take by mouth.    . risedronate (ACTONEL) 35  MG tablet TAKE 1 TABLET EVERY 14 DAYS 4 tablet 6  . tamoxifen (NOLVADEX) 20 MG tablet TAKE 1 TABLET (20 MG TOTAL) BY MOUTH DAILY. 90 tablet 2   No current facility-administered medications for this visit.    OBJECTIVE: There were no vitals filed for this visit.   There is no height or weight on file to calculate BMI.    ECOG FS:0 - Asymptomatic  General: Well-developed, well-nourished, no acute distress. HEENT: Normocephalic. Neuro: Alert, answering all questions appropriately. Cranial nerves grossly intact. Psych: Normal affect.   LAB RESULTS:  Lab Results  Component Value Date   NA 138 07/28/2016   K 4.8 07/28/2016   CL 106 07/28/2016   CO2 22 07/28/2016   GLUCOSE 96 07/28/2016   BUN  39 (H) 07/28/2016   CREATININE 1.74 (H) 07/28/2016   CALCIUM 8.9 07/28/2016   GFRNONAA 27 (L) 07/28/2016   GFRAA 31 (L) 07/28/2016    Lab Results  Component Value Date   WBC 6.7 07/13/2016   NEUTROABS 5.0 05/28/2014   HGB 12.2 07/13/2016   HCT 36.9 07/13/2016   MCV 92.9 07/13/2016   PLT 178 07/13/2016     STUDIES: No results found.  ASSESSMENT: Pathologic stage IA ER positive, PR HER-2 negative invasive carcinoma of the upper inner quadrant of right breast. High-risk Mammoprint.  PLAN:    1. Pathologic stage IA ER positive, PR HER-2 negative invasive carcinoma of the upper inner quadrant of right breast: Final pathology was reviewed independently. Although patient was noted to have a high risk Mammoprint, it was determined given her advanced age as well as multiple comorbidities that chemotherapy was not in her best interest.  Patient was switched to tamoxifen given osteoporosis and is tolerating this well.  She will complete 5 years of treatment in March 2023.  Her most recent mammogram on May 31, 2019 was reported as BI-RADS 2.  Repeat in January 2022.  Return to clinic in 6 months for routine evaluation. 2.  Osteoporosis: Patient's most recent bone mineral density on December 17, 2018 reported T score of -2.8 which is mildly improved when 1 year prior where she had a T score of -3.0.  Continue tamoxifen as above.  Continue Fosamax as prescribed.  Repeat bone mineral density in July 2021. 3.  DVT: Diagnosed on Sep 21, 2018.  Patient reports her cardiologist and primary care have instructed her to remain on Eliquis as prescribed.  Patient expressed understanding and was in agreement with this plan. She also understands that She can call clinic at any time with any questions, concerns, or complaints.   Cancer Staging Primary cancer of upper inner quadrant of right female breast [facility]) Staging form: Breast, AJCC 7th Edition - Pathologic stage from 05/15/2016: Stage IA (T1c, N0, cM0) - Signed by Lloyd Huger, MD on 05/15/2016   Lloyd Huger, MD   07/01/2019 4:01 PM

## 2019-07-01 ENCOUNTER — Other Ambulatory Visit: Payer: Self-pay

## 2019-07-01 ENCOUNTER — Inpatient Hospital Stay: Payer: Medicare HMO | Attending: Oncology | Admitting: Oncology

## 2019-07-01 DIAGNOSIS — C50211 Malignant neoplasm of upper-inner quadrant of right female breast: Secondary | ICD-10-CM

## 2019-07-04 ENCOUNTER — Other Ambulatory Visit: Payer: Self-pay | Admitting: Oncology

## 2019-07-16 DIAGNOSIS — M25451 Effusion, right hip: Secondary | ICD-10-CM | POA: Diagnosis not present

## 2019-07-16 DIAGNOSIS — M25551 Pain in right hip: Secondary | ICD-10-CM | POA: Diagnosis not present

## 2019-07-16 DIAGNOSIS — M1611 Unilateral primary osteoarthritis, right hip: Secondary | ICD-10-CM | POA: Diagnosis not present

## 2019-07-17 DIAGNOSIS — H40003 Preglaucoma, unspecified, bilateral: Secondary | ICD-10-CM | POA: Diagnosis not present

## 2019-07-30 DIAGNOSIS — H34831 Tributary (branch) retinal vein occlusion, right eye, with macular edema: Secondary | ICD-10-CM | POA: Diagnosis not present

## 2019-09-03 DIAGNOSIS — H34831 Tributary (branch) retinal vein occlusion, right eye, with macular edema: Secondary | ICD-10-CM | POA: Diagnosis not present

## 2019-10-08 DIAGNOSIS — H34831 Tributary (branch) retinal vein occlusion, right eye, with macular edema: Secondary | ICD-10-CM | POA: Diagnosis not present

## 2019-10-14 DIAGNOSIS — M25451 Effusion, right hip: Secondary | ICD-10-CM | POA: Diagnosis not present

## 2019-10-14 DIAGNOSIS — M1611 Unilateral primary osteoarthritis, right hip: Secondary | ICD-10-CM | POA: Diagnosis not present

## 2019-10-14 DIAGNOSIS — M25551 Pain in right hip: Secondary | ICD-10-CM | POA: Diagnosis not present

## 2019-10-29 DIAGNOSIS — R8290 Unspecified abnormal findings in urine: Secondary | ICD-10-CM | POA: Diagnosis not present

## 2019-10-29 DIAGNOSIS — I129 Hypertensive chronic kidney disease with stage 1 through stage 4 chronic kidney disease, or unspecified chronic kidney disease: Secondary | ICD-10-CM | POA: Diagnosis not present

## 2019-10-29 DIAGNOSIS — N184 Chronic kidney disease, stage 4 (severe): Secondary | ICD-10-CM | POA: Diagnosis not present

## 2019-11-04 DIAGNOSIS — I129 Hypertensive chronic kidney disease with stage 1 through stage 4 chronic kidney disease, or unspecified chronic kidney disease: Secondary | ICD-10-CM | POA: Diagnosis not present

## 2019-11-04 DIAGNOSIS — R809 Proteinuria, unspecified: Secondary | ICD-10-CM | POA: Diagnosis not present

## 2019-11-04 DIAGNOSIS — D631 Anemia in chronic kidney disease: Secondary | ICD-10-CM | POA: Diagnosis not present

## 2019-11-04 DIAGNOSIS — E875 Hyperkalemia: Secondary | ICD-10-CM | POA: Diagnosis not present

## 2019-11-04 DIAGNOSIS — N184 Chronic kidney disease, stage 4 (severe): Secondary | ICD-10-CM | POA: Diagnosis not present

## 2019-11-04 DIAGNOSIS — N2581 Secondary hyperparathyroidism of renal origin: Secondary | ICD-10-CM | POA: Diagnosis not present

## 2019-11-26 DIAGNOSIS — Z85820 Personal history of malignant melanoma of skin: Secondary | ICD-10-CM | POA: Diagnosis not present

## 2019-11-26 DIAGNOSIS — L570 Actinic keratosis: Secondary | ICD-10-CM | POA: Diagnosis not present

## 2019-12-03 DIAGNOSIS — H34831 Tributary (branch) retinal vein occlusion, right eye, with macular edema: Secondary | ICD-10-CM | POA: Diagnosis not present

## 2019-12-18 ENCOUNTER — Other Ambulatory Visit: Payer: Medicare HMO

## 2019-12-31 ENCOUNTER — Other Ambulatory Visit: Payer: Medicare HMO

## 2020-01-01 ENCOUNTER — Inpatient Hospital Stay
Admission: EM | Admit: 2020-01-01 | Discharge: 2020-01-07 | DRG: 481 | Disposition: A | Discharge status: Skilled Nursing Facility | Payer: Medicare HMO | Attending: Internal Medicine | Admitting: Internal Medicine

## 2020-01-01 ENCOUNTER — Encounter: Payer: Self-pay | Admitting: Emergency Medicine

## 2020-01-01 ENCOUNTER — Emergency Department: Payer: Medicare HMO

## 2020-01-01 ENCOUNTER — Other Ambulatory Visit: Payer: Self-pay

## 2020-01-01 DIAGNOSIS — N184 Chronic kidney disease, stage 4 (severe): Secondary | ICD-10-CM | POA: Diagnosis present

## 2020-01-01 DIAGNOSIS — Z79899 Other long term (current) drug therapy: Secondary | ICD-10-CM

## 2020-01-01 DIAGNOSIS — Z90711 Acquired absence of uterus with remaining cervical stump: Secondary | ICD-10-CM | POA: Diagnosis not present

## 2020-01-01 DIAGNOSIS — M25474 Effusion, right foot: Secondary | ICD-10-CM | POA: Diagnosis not present

## 2020-01-01 DIAGNOSIS — K219 Gastro-esophageal reflux disease without esophagitis: Secondary | ICD-10-CM | POA: Diagnosis present

## 2020-01-01 DIAGNOSIS — Z20822 Contact with and (suspected) exposure to covid-19: Secondary | ICD-10-CM | POA: Diagnosis present

## 2020-01-01 DIAGNOSIS — Z7983 Long term (current) use of bisphosphonates: Secondary | ICD-10-CM | POA: Diagnosis not present

## 2020-01-01 DIAGNOSIS — M25551 Pain in right hip: Secondary | ICD-10-CM

## 2020-01-01 DIAGNOSIS — I129 Hypertensive chronic kidney disease with stage 1 through stage 4 chronic kidney disease, or unspecified chronic kidney disease: Secondary | ICD-10-CM | POA: Diagnosis present

## 2020-01-01 DIAGNOSIS — S72034A Nondisplaced midcervical fracture of right femur, initial encounter for closed fracture: Secondary | ICD-10-CM | POA: Diagnosis not present

## 2020-01-01 DIAGNOSIS — Z79810 Long term (current) use of selective estrogen receptor modulators (SERMs): Secondary | ICD-10-CM

## 2020-01-01 DIAGNOSIS — M25451 Effusion, right hip: Secondary | ICD-10-CM | POA: Diagnosis not present

## 2020-01-01 DIAGNOSIS — M62838 Other muscle spasm: Secondary | ICD-10-CM | POA: Diagnosis present

## 2020-01-01 DIAGNOSIS — R600 Localized edema: Secondary | ICD-10-CM | POA: Diagnosis not present

## 2020-01-01 DIAGNOSIS — W010XXA Fall on same level from slipping, tripping and stumbling without subsequent striking against object, initial encounter: Secondary | ICD-10-CM | POA: Diagnosis present

## 2020-01-01 DIAGNOSIS — M810 Age-related osteoporosis without current pathological fracture: Secondary | ICD-10-CM | POA: Diagnosis present

## 2020-01-01 DIAGNOSIS — S8991XA Unspecified injury of right lower leg, initial encounter: Secondary | ICD-10-CM | POA: Diagnosis not present

## 2020-01-01 DIAGNOSIS — M1611 Unilateral primary osteoarthritis, right hip: Secondary | ICD-10-CM | POA: Diagnosis present

## 2020-01-01 DIAGNOSIS — R52 Pain, unspecified: Secondary | ICD-10-CM | POA: Diagnosis not present

## 2020-01-01 DIAGNOSIS — C50911 Malignant neoplasm of unspecified site of right female breast: Secondary | ICD-10-CM | POA: Diagnosis not present

## 2020-01-01 DIAGNOSIS — R1312 Dysphagia, oropharyngeal phase: Secondary | ICD-10-CM | POA: Diagnosis not present

## 2020-01-01 DIAGNOSIS — I672 Cerebral atherosclerosis: Secondary | ICD-10-CM | POA: Diagnosis not present

## 2020-01-01 DIAGNOSIS — S72001D Fracture of unspecified part of neck of right femur, subsequent encounter for closed fracture with routine healing: Secondary | ICD-10-CM | POA: Diagnosis not present

## 2020-01-01 DIAGNOSIS — S72009A Fracture of unspecified part of neck of unspecified femur, initial encounter for closed fracture: Secondary | ICD-10-CM

## 2020-01-01 DIAGNOSIS — Z86718 Personal history of other venous thrombosis and embolism: Secondary | ICD-10-CM

## 2020-01-01 DIAGNOSIS — R05 Cough: Secondary | ICD-10-CM | POA: Diagnosis not present

## 2020-01-01 DIAGNOSIS — M2550 Pain in unspecified joint: Secondary | ICD-10-CM | POA: Diagnosis not present

## 2020-01-01 DIAGNOSIS — S79921A Unspecified injury of right thigh, initial encounter: Secondary | ICD-10-CM | POA: Diagnosis not present

## 2020-01-01 DIAGNOSIS — Z8542 Personal history of malignant neoplasm of other parts of uterus: Secondary | ICD-10-CM

## 2020-01-01 DIAGNOSIS — S99921A Unspecified injury of right foot, initial encounter: Secondary | ICD-10-CM | POA: Diagnosis not present

## 2020-01-01 DIAGNOSIS — M7989 Other specified soft tissue disorders: Secondary | ICD-10-CM | POA: Diagnosis not present

## 2020-01-01 DIAGNOSIS — Z923 Personal history of irradiation: Secondary | ICD-10-CM | POA: Diagnosis not present

## 2020-01-01 DIAGNOSIS — E785 Hyperlipidemia, unspecified: Secondary | ICD-10-CM | POA: Diagnosis present

## 2020-01-01 DIAGNOSIS — S79911A Unspecified injury of right hip, initial encounter: Secondary | ICD-10-CM | POA: Diagnosis not present

## 2020-01-01 DIAGNOSIS — Z7901 Long term (current) use of anticoagulants: Secondary | ICD-10-CM | POA: Diagnosis not present

## 2020-01-01 DIAGNOSIS — I10 Essential (primary) hypertension: Secondary | ICD-10-CM | POA: Diagnosis not present

## 2020-01-01 DIAGNOSIS — W19XXXD Unspecified fall, subsequent encounter: Secondary | ICD-10-CM | POA: Diagnosis not present

## 2020-01-01 DIAGNOSIS — E875 Hyperkalemia: Secondary | ICD-10-CM | POA: Diagnosis not present

## 2020-01-01 DIAGNOSIS — Z853 Personal history of malignant neoplasm of breast: Secondary | ICD-10-CM

## 2020-01-01 DIAGNOSIS — Y92009 Unspecified place in unspecified non-institutional (private) residence as the place of occurrence of the external cause: Secondary | ICD-10-CM

## 2020-01-01 DIAGNOSIS — S72044D Nondisplaced fracture of base of neck of right femur, subsequent encounter for closed fracture with routine healing: Secondary | ICD-10-CM | POA: Diagnosis not present

## 2020-01-01 DIAGNOSIS — R059 Cough, unspecified: Secondary | ICD-10-CM

## 2020-01-01 DIAGNOSIS — D649 Anemia, unspecified: Secondary | ICD-10-CM | POA: Diagnosis present

## 2020-01-01 DIAGNOSIS — S99911A Unspecified injury of right ankle, initial encounter: Secondary | ICD-10-CM | POA: Diagnosis not present

## 2020-01-01 DIAGNOSIS — S72001A Fracture of unspecified part of neck of right femur, initial encounter for closed fracture: Secondary | ICD-10-CM | POA: Diagnosis present

## 2020-01-01 DIAGNOSIS — N179 Acute kidney failure, unspecified: Secondary | ICD-10-CM | POA: Diagnosis not present

## 2020-01-01 DIAGNOSIS — N390 Urinary tract infection, site not specified: Secondary | ICD-10-CM | POA: Diagnosis present

## 2020-01-01 DIAGNOSIS — M25571 Pain in right ankle and joints of right foot: Secondary | ICD-10-CM

## 2020-01-01 DIAGNOSIS — I2510 Atherosclerotic heart disease of native coronary artery without angina pectoris: Secondary | ICD-10-CM | POA: Diagnosis present

## 2020-01-01 DIAGNOSIS — S3993XA Unspecified injury of pelvis, initial encounter: Secondary | ICD-10-CM | POA: Diagnosis not present

## 2020-01-01 DIAGNOSIS — W19XXXA Unspecified fall, initial encounter: Secondary | ICD-10-CM | POA: Diagnosis not present

## 2020-01-01 DIAGNOSIS — S0990XA Unspecified injury of head, initial encounter: Secondary | ICD-10-CM | POA: Diagnosis not present

## 2020-01-01 DIAGNOSIS — G4733 Obstructive sleep apnea (adult) (pediatric): Secondary | ICD-10-CM | POA: Diagnosis not present

## 2020-01-01 DIAGNOSIS — R609 Edema, unspecified: Secondary | ICD-10-CM | POA: Diagnosis not present

## 2020-01-01 DIAGNOSIS — Z419 Encounter for procedure for purposes other than remedying health state, unspecified: Secondary | ICD-10-CM

## 2020-01-01 DIAGNOSIS — G319 Degenerative disease of nervous system, unspecified: Secondary | ICD-10-CM | POA: Diagnosis not present

## 2020-01-01 DIAGNOSIS — M19071 Primary osteoarthritis, right ankle and foot: Secondary | ICD-10-CM | POA: Diagnosis not present

## 2020-01-01 DIAGNOSIS — Z7401 Bed confinement status: Secondary | ICD-10-CM | POA: Diagnosis not present

## 2020-01-01 LAB — BASIC METABOLIC PANEL
Anion gap: 11 (ref 5–15)
BUN: 27 mg/dL — ABNORMAL HIGH (ref 8–23)
CO2: 21 mmol/L — ABNORMAL LOW (ref 22–32)
Calcium: 7.9 mg/dL — ABNORMAL LOW (ref 8.9–10.3)
Chloride: 108 mmol/L (ref 98–111)
Creatinine, Ser: 1.95 mg/dL — ABNORMAL HIGH (ref 0.44–1.00)
GFR calc Af Amer: 27 mL/min — ABNORMAL LOW (ref >60)
GFR calc non Af Amer: 23 mL/min — ABNORMAL LOW (ref >60)
Glucose, Bld: 101 mg/dL — ABNORMAL HIGH (ref 70–99)
Potassium: 4.5 mmol/L (ref 3.5–5.1)
Sodium: 140 mmol/L (ref 135–145)

## 2020-01-01 LAB — URINALYSIS, COMPLETE (UACMP) WITH MICROSCOPIC
Bilirubin Urine: NEGATIVE
Glucose, UA: NEGATIVE mg/dL
Hgb urine dipstick: NEGATIVE
Ketones, ur: NEGATIVE mg/dL
Nitrite: NEGATIVE
Protein, ur: NEGATIVE mg/dL
Specific Gravity, Urine: 1.012 (ref 1.005–1.030)
pH: 5 (ref 5.0–8.0)

## 2020-01-01 LAB — CBC
HCT: 29.5 % — ABNORMAL LOW (ref 36.0–46.0)
Hemoglobin: 9.4 g/dL — ABNORMAL LOW (ref 12.0–15.0)
MCH: 31.4 pg (ref 26.0–34.0)
MCHC: 31.9 g/dL (ref 30.0–36.0)
MCV: 98.7 fL (ref 80.0–100.0)
Platelets: 166 10*3/uL (ref 150–400)
RBC: 2.99 MIL/uL — ABNORMAL LOW (ref 3.87–5.11)
RDW: 12.7 % (ref 11.5–15.5)
WBC: 8 10*3/uL (ref 4.0–10.5)
nRBC: 0 % (ref 0.0–0.2)

## 2020-01-01 MED ORDER — FOSFOMYCIN TROMETHAMINE 3 G PO PACK
3.0000 g | PACK | Freq: Once | ORAL | Status: AC
  Administered 2020-01-01: 3 g via ORAL
  Filled 2020-01-01: qty 3

## 2020-01-01 MED ORDER — TRAMADOL HCL 50 MG PO TABS
50.0000 mg | ORAL_TABLET | ORAL | Status: AC
  Administered 2020-01-01: 50 mg via ORAL
  Filled 2020-01-01: qty 1

## 2020-01-01 NOTE — ED Provider Notes (Signed)
Radiology calls me and says they believe that the MRI showing an incomplete fracture of the proximal femur.  Radiologist says he and 2 other radiologist has looked at this and they all agree.  I will call orthopedics and plan on getting her in the hospital.   Nena Polio, MD 01/01/20 2348

## 2020-01-01 NOTE — ED Triage Notes (Addendum)
Pt from home via [facility]. Per EMS, pt arrives with CC of mechanical fall this Friday 8/6, pt st "my keens just gave out" fell and hit "my head on the walker". Pt on eloquis.  Arrives today with c/o right hip/foot pain. Shortening/ rotation/ swelling noted in right foot noted upon arrival.  EDP Quale at [facility] upon arrival.

## 2020-01-01 NOTE — ED Notes (Signed)
Pt given meal tray at this time 

## 2020-01-01 NOTE — ED Notes (Signed)
This Rn attempted to contact daughter Dennard Nip w/o success.

## 2020-01-01 NOTE — ED Notes (Signed)
This rn attempted to estcblish 2PIV w/o success. Able to draw blood.

## 2020-01-01 NOTE — ED Notes (Signed)
Pt to CT and Xray at this time 

## 2020-01-01 NOTE — ED Notes (Signed)
Pt would like to try pure wick before attempting in-out at this time.

## 2020-01-01 NOTE — ED Notes (Signed)
Pt st unable to bear weigh/UA to stand on her own at home. Pt st 'it 3 people to help me" have not been able to get up or stand since fall on Friday 8/6. Pt A/Ox4. Daughter at bedside.    EDP Malinda made aware.

## 2020-01-01 NOTE — ED Provider Notes (Signed)
Patient's UA is positive for what appears to be UTI.  We will treat her with some antibiotics.  I have gotten some fosfomycin for her.  Patient refuses to bear weight on her leg on the right leg.  She says it hurts too much.  She says she cannot do it.  CT is negative.  I will get an MRI of her hip just to be 100% sure and then will get social work and physical therapy involved.  She lives by herself so she cannot go home at the present time.   Nena Polio, MD 01/01/20 1820

## 2020-01-01 NOTE — ED Notes (Signed)
Family at bedside. 

## 2020-01-01 NOTE — ED Notes (Signed)
Pt placed in hospital bed at this time

## 2020-01-01 NOTE — ED Notes (Signed)
Pt to CT

## 2020-01-01 NOTE — ED Provider Notes (Signed)
[facility] Emergency Department Provider Note   ____________________________________________   None    (approximate)  I have reviewed the triage vital signs and the nursing notes.   HISTORY  Chief Complaint Fall    HPI NILZA Jones is a 86 y.o. female here for evaluation of right hip and right ankle pain  Patient reports on Friday she got tripped up in her walker.  Her son had to come over to help her up but she was having pain in her right hip and right foot.  She really has not been able to walk or bear pressure on the right foot since Friday.  Nothing led to her fall other than she stumbled, reports that she mishandled one of the grips on the walker that caused her to fall  Not have any chest pain or trouble breathing.  No recent illness.  She did strike her head on the side of the walker and had a small abrasion over the right side of her scalp.  Does take blood thinners for known right lower leg blood clot.  Right leg always slightly swollen since being diagnosed with blood clot.  No injury to the arms or left leg, but reports pain and limitation with the right lower extremity.  Took Tylenol earlier today that provided pretty good relief but still too sore to bear weight on that leg   Past Medical History:  Diagnosis Date  . Anemia    is followed by renal doctor and pcp  . Arthritis   . Breast cancer ([place]) 04/2016   Rt. breast ca, f/u with radiation  . Cancer [facility]) 1961   uterine cancer  . Coronary artery disease   . Dyspnea   . GERD (gastroesophageal reflux disease)   . Hypertension   . Kidney disease    stage 4.  followed by dr. Sedonia Small  . Lower extremity edema   . Personal history of radiation therapy   . S/P lumpectomy, right breast    having done on 04/26/16  . Sleep apnea    uses cpap    Patient Active Problem List   Diagnosis Date Noted  . Chronic deep vein thrombosis (DVT) of proximal vein of lower extremity ([place]) 02/06/2019   . B12 deficiency 10/23/2017  . Hydronephrosis 11/18/2016  . Gout 11/18/2016  . Cervical cancer ([place]) 11/18/2016  . Carotid stenosis 04/04/2016  . Primary cancer of upper inner quadrant of right female breast ([place]) 04/04/2016  . Lymphedema 04/04/2016  . Chronic venous insufficiency 04/04/2016  . Chest tightness 02/15/2016  . Obstructive sleep apnea syndrome 08/13/2015  . SOB (shortness of breath) 12/18/2014  . Chronic fatigue 07/09/2014  . CKD (chronic kidney disease) stage 4, GFR 15-29 ml/min (HCC) 05/09/2014  . Atypical chest pain 05/09/2014  . Chronic tension-type headache, intractable 03/06/2014  . Headache 02/06/2014  . Facial numbness 02/06/2014  . Rheumatoid arthritis involving multiple sites with positive rheumatoid factor ([place]) 11/01/2013  . Peripheral edema 11/01/2013  . Post herpetic neuralgia 11/01/2013  . Hyperlipidemia 11/01/2013  . HTN (hypertension) 11/01/2013  . History of recurrent UTIs 11/01/2013  . H/O: hysterectomy 11/01/2013  . GERD (gastroesophageal reflux disease) 11/01/2013  . Chronic headache 11/01/2013  . Anemia 11/01/2013    Past Surgical History:  Procedure Laterality Date  . BREAST BIOPSY Right 03/2016   invasive mammary carcinoma  . BREAST LUMPECTOMY Right    04/26/16, f/u with radiation  . CAROTID STENT Left 2016   was having pain down neck.  carotid artery clogged. dr. Delana Meyer put stent in  . CAROTID STENT Left   . CATARACT EXTRACTION W/ INTRAOCULAR LENS  IMPLANT, BILATERAL Bilateral 2009  . EYE SURGERY  2009   cataracts  . PARTIAL HYSTERECTOMY  1961   uterus only removed  . PARTIAL MASTECTOMY WITH NEEDLE LOCALIZATION Right 04/26/2016   Procedure: PARTIAL MASTECTOMY WITH NEEDLE LOCALIZATION;  Surgeon: Leonie Green, MD;  Location: [facility] ORS;  Service: General;  Laterality: Right;  . SENTINEL NODE BIOPSY Right 04/26/2016   Procedure: SENTINEL NODE BIOPSY;  Surgeon: Leonie Green, MD;  Location: [facility] ORS;  Service: General;   Laterality: Right;    Prior to Admission medications   Medication Sig Start Date End Date Taking? Authorizing Provider  acetaminophen (TYLENOL) 500 MG tablet Take 1,000 mg by mouth daily. May take an additional 1000 mgs as needed for pain    [provider]  alendronate (FOSAMAX) 70 MG tablet Take 1 tablet (70 mg total) by mouth once a week. Take with a full glass of water on an empty stomach. 12/18/17   Jacquelin Hawking, NP  apixaban (ELIQUIS) 2.5 MG TABS tablet Take 1 tablet by mouth 2 (two) times a day. 12/09/18   [provider]  calcium-vitamin D (OSCAL WITH D) 500-200 MG-UNIT tablet Take 1 tablet by mouth 2 (two) times daily. 09/01/16   Lloyd Huger, MD  Cholecalciferol (VITAMIN D3) 1000 units CAPS Take 1,000 Units by mouth daily.     [provider]  Cranberry 500 MG TABS Take by mouth 2 (two) times daily.    [provider]  Cyanocobalamin (VITAMIN B-12 IJ) Inject 1,000 mcg as directed every 30 (thirty) days.    [provider]  docusate sodium (STOOL SOFTENER) 100 MG capsule Take 100 mg by mouth 2 (two) times daily.    [provider]  esomeprazole (NEXIUM) 40 MG capsule Take 40 mg by mouth daily.    [provider]  ferrous gluconate (FERGON) 324 MG tablet Take 324 mg by mouth daily.    [provider]  furosemide (LASIX) 20 MG tablet Take 20 mg by mouth daily as needed for edema.     [provider]  losartan (COZAAR) 50 MG tablet Take 50 mg by mouth daily.    [provider]  lovastatin (MEVACOR) 40 MG tablet Take 40 mg by mouth 2 (two) times daily.    [provider]  metoprolol succinate (TOPROL-XL) 25 MG 24 hr tablet Take by mouth. 02/22/18   [provider]  Probiotic Product (PROBIOTIC ADVANCED) CAPS Take by mouth.    [provider]  risedronate (ACTONEL) 35 MG tablet TAKE 1 TABLET EVERY 14 DAYS 03/14/19   Lloyd Huger, MD  tamoxifen (NOLVADEX) 20 MG  tablet TAKE 1 TABLET (20 MG TOTAL) BY MOUTH DAILY. 07/05/19   Lloyd Huger, MD    Allergies Cefuroxime axetil, Codeine, and Sulfa antibiotics  Family History  Problem Relation Age of Onset  . Bladder Cancer Neg Hx   . Kidney cancer Neg Hx   . Prolactinoma Neg Hx   . Prostate cancer Neg Hx     Social History Social History   Tobacco Use  . Smoking status: Never Smoker  . Smokeless tobacco: Never Used  Substance Use Topics  . Alcohol use: No  . Drug use: No    Review of Systems Constitutional: No fever/chills Eyes: No visual changes. ENT: No neck pain Cardiovascular: Denies chest pain. Respiratory: Denies shortness  of breath. Gastrointestinal: No abdominal pain.   Musculoskeletal: Negative for back pain. Skin: Negative for rash. Neurological: Negative for headaches, areas of focal weakness or numbness.    ____________________________________________   PHYSICAL EXAM:  VITAL SIGNS: ED Triage Vitals  Enc Vitals Group     BP      Pulse      Resp      Temp      Temp src      SpO2      Weight      Height      Head Circumference      Peak Flow      Pain Score      Pain Loc      Pain Edu?      Excl. in [place]?     Constitutional: Alert and oriented. Well appearing and in no acute distress.  Very pleasant. Eyes: Conjunctivae are normal. Head: Atraumatic. Nose: No congestion/rhinnorhea. Mouth/Throat: Mucous membranes are moist. Neck: No stridor.  Cardiovascular: Normal rate, regular rhythm. Grossly normal heart sounds.  Good peripheral circulation. Respiratory: Normal respiratory effort.  No retractions. Lungs CTAB. Gastrointestinal: Soft and nontender. No distention. Musculoskeletal:   Lower Extremities  No edema. Normal DP/PT pulses bilateral with good cap refill.  Normal neuro-motor function lower extremities bilateral.  RIGHT Right lower extremity demonstrates normal strength, good use of all muscles though limitation due to pain at the right  ankle and foot and some in the right hip. No edema bruising or contusions of the right hip, right knee, right ankle.  Pain on axial loading in the right ankle and also in the right hip area.  There is slight external rotation, and apparently some swelling or slight deformity noted over the medial and lateral ankle joints top of the foot.  No erythema.  Strong palpable pulses and normal toe wiggle and sensation over the right foot.   LEFT Left lower extremity demonstrates normal strength, good use of all muscles. No edema bruising or contusions of the hip,  knee, ankle. Full range of motion of the left lower extremity without pain. No pain on axial loading. No evidence of trauma.   Neurologic:  Normal speech and language. No gross focal neurologic deficits are appreciated.  Skin:  Skin is warm, dry and intact. No rash noted. Psychiatric: Mood and affect are normal. Speech and behavior are normal.  ____________________________________________   LABS (all labs ordered are listed, but only abnormal results are displayed)  Labs Reviewed  CBC - Abnormal; Notable for the following components:      Result Value   RBC 2.99 (*)    Hemoglobin 9.4 (*)    HCT 29.5 (*)    All other components within normal limits  BASIC METABOLIC PANEL - Abnormal; Notable for the following components:   CO2 21 (*)    Glucose, Bld 101 (*)    BUN 27 (*)    Creatinine, Ser 1.95 (*)    Calcium 7.9 (*)    GFR calc non Af Amer 23 (*)    GFR calc Af Amer 27 (*)    All other components within normal limits  URINE CULTURE  URINALYSIS, COMPLETE (UACMP) WITH MICROSCOPIC  CBG MONITORING, ED   ____________________________________________  EKG  Reviewed enterotomy at 1350 Heart rate 69 QRS 100 QTc 400 Normal sinus rhythm, slight baseline artifact.  No evidence of acute ischemia ____________________________________________  RADIOLOGY  DG Pelvis 1-2 Views  Result Date: 01/01/2020 CLINICAL DATA:  Pain following  fall  EXAM: PELVIS - 1-2 VIEW COMPARISON:  None. FINDINGS: There is no evidence of pelvic fracture or dislocation. There is marked narrowing of the right hip joint. Left hip joint appears essentially unremarkable. Sacroiliac joints appear unremarkable bilaterally. Bones osteoporotic. IMPRESSION: Severe osteoarthritic change in the right hip joint. Bones osteoporotic. No fracture or dislocation. Electronically Signed   By: Lowella Grip III M.D.   On: 01/01/2020 14:02   DG Tibia/Fibula Right  Result Date: 01/01/2020 CLINICAL DATA:  Pain following fall EXAM: RIGHT TIBIA AND FIBULA - 2 VIEW COMPARISON:  None. FINDINGS: Frontal and lateral views were obtained. No appreciable fracture or dislocation. Bones are osteoporotic. No abnormal periosteal reaction. Mild generalized joint space narrowing. IMPRESSION: No fracture or dislocation. Bones osteoporotic. Knee and ankle joint osteoarthritic changes noted. Electronically Signed   By: Lowella Grip III M.D.   On: 01/01/2020 14:03   CT Head Wo Contrast  Result Date: 01/01/2020 CLINICAL DATA:  Status post fall 5 ago 12/27/2019 EXAM: CT HEAD WITHOUT CONTRAST TECHNIQUE: Contiguous axial images were obtained from the base of the skull through the vertex without intravenous contrast. COMPARISON:  08/05/2010 FINDINGS: Brain: No evidence of acute infarction, hemorrhage, extra-axial collection, ventriculomegaly, or mass effect. Generalized cerebral atrophy. Periventricular white matter low attenuation likely secondary to microangiopathy. Vascular: Cerebrovascular atherosclerotic calcifications are noted. Skull: Negative for fracture or focal lesion. Sinuses/Orbits: Visualized portions of the orbits are unremarkable. Visualized portions of the paranasal sinuses are unremarkable. Visualized portions of the mastoid air cells are unremarkable. Other: None. IMPRESSION: 1. No acute intracranial pathology. 2. Chronic microvascular disease and cerebral atrophy. Electronically  Signed   By: Kathreen Devoid   On: 01/01/2020 13:39   DG Foot Complete Right  Result Date: 01/01/2020 CLINICAL DATA:  Pain following fall EXAM: RIGHT FOOT COMPLETE - 3+ VIEW COMPARISON:  None. FINDINGS: Frontal, oblique, and lateral views were obtained. There is no acute fracture or dislocation. Remodeling of the calcaneus suggests prior trauma in this area. Bones are diffusely osteoporotic. There is calcification in the medial aspect of the first MTP joint. There is diffuse osteoarthritic change in the subtalar joints. Other joint spaces appear unremarkable. There is pes planus. IMPRESSION: Suspect residua of old trauma in the calcaneus. No acute fracture or dislocation. Pes planus. Calcification in the medial first MTP joint likely is due to calcium pyrophosphate deposition disease. Bones osteoporotic. Electronically Signed   By: Lowella Grip III M.D.   On: 01/01/2020 14:01   DG Femur Min 2 Views Right  Result Date: 01/01/2020 CLINICAL DATA:  Pain following fall EXAM: RIGHT FEMUR 2 VIEWS COMPARISON:  None. FINDINGS: Frontal and lateral views were obtained. No fracture or dislocation. No abnormal periosteal reaction. There is severe narrowing of the right hip joint. There is milder arthropathy in the knee region. IMPRESSION: No fracture or dislocation. Osteoarthritic change, most severe in the hip joint region. Electronically Signed   By: Lowella Grip III M.D.   On: 01/01/2020 14:00     Plain x-ray imaging reviewed, no obvious acute fracture, though there is significant osteoarthritic changes.  There is a suspicion of a possible old traumatic injury to the calcaneus.  Significant arthritic changes around the right hip. ____________________________________________   PROCEDURES  Procedure(s) performed: None  Procedures  Critical Care performed: No  ____________________________________________   INITIAL IMPRESSION / ASSESSMENT AND PLAN / ED COURSE  Pertinent labs & imaging results  that were available during my care of the patient were reviewed by me and considered in my medical  decision making (see chart for details).   Patient presents after mechanical fall.  She denies any precipitating symptoms.  She has however noticed the last days she has had slight burning with urination does have a history of UTIs.  Will obtain urinalysis.  Her lab work reassuring, and hemodynamics are normal.  She is alert well oriented, minimal discomfort but when she does move she reports pain in the right ankle right hip region.  Clinical Course as of Dec 31 1452  Wed Jan 01, 2020  1300 Patient does report she has had some slight burning or discomfort with urination over the last couple days as well.  History of UTIs.  Will check urinalysis   [MQ]  1443 Labs reviewed, slight anemia, patient does not have any symptoms suggest acute bleeding.  She does have chronic renal disease, which she and her family who are at the bedside are well aware of.  This appears to be baseline.  She is requesting something additional for pain, discussed risk benefits and potential adverse effects of tramadol with the patient and family, will provide tramadol.  Thus far her imaging reassuring without clear evidence of acute fracture, but given her frailty and significant osteoarthritis will obtain noncontrast images of the right hip right ankle and right foot to further evaluate the cause.   [MQ]    Clinical Course User Index [MQ] Delman Kitten, MD    ----------------------------------------- 2:53 PM on 01/01/2020 -----------------------------------------  Ongoing care assigned to Dr. Rip Harbour.  Follow-up on CT imaging and urinalysis.  Evaluating for evidence of right hip or right foot or ankle injury.  If imaging reassuring, would test ambulation after the patient is received tramadol.  If she is able to do well with this, would anticipate likely be able to be discharged home assuming no acute fractures or pathology  noted on the CT.  Urinalysis pending as well as patient reporting some slight dysuria ____________________________________________   FINAL CLINICAL IMPRESSION(S) / ED DIAGNOSES  Final diagnoses:  Right hip pain  Acute right ankle pain        Note:  This document was prepared using Dragon voice recognition software and may include unintentional dictation errors       Delman Kitten, MD 01/01/20 1513

## 2020-01-02 DIAGNOSIS — M810 Age-related osteoporosis without current pathological fracture: Secondary | ICD-10-CM | POA: Diagnosis present

## 2020-01-02 DIAGNOSIS — E785 Hyperlipidemia, unspecified: Secondary | ICD-10-CM | POA: Diagnosis present

## 2020-01-02 DIAGNOSIS — Z923 Personal history of irradiation: Secondary | ICD-10-CM | POA: Diagnosis not present

## 2020-01-02 DIAGNOSIS — E875 Hyperkalemia: Secondary | ICD-10-CM | POA: Diagnosis not present

## 2020-01-02 DIAGNOSIS — M1611 Unilateral primary osteoarthritis, right hip: Secondary | ICD-10-CM | POA: Diagnosis present

## 2020-01-02 DIAGNOSIS — Y92009 Unspecified place in unspecified non-institutional (private) residence as the place of occurrence of the external cause: Secondary | ICD-10-CM | POA: Diagnosis not present

## 2020-01-02 DIAGNOSIS — Z7901 Long term (current) use of anticoagulants: Secondary | ICD-10-CM

## 2020-01-02 DIAGNOSIS — N184 Chronic kidney disease, stage 4 (severe): Secondary | ICD-10-CM

## 2020-01-02 DIAGNOSIS — Z86718 Personal history of other venous thrombosis and embolism: Secondary | ICD-10-CM | POA: Diagnosis not present

## 2020-01-02 DIAGNOSIS — N390 Urinary tract infection, site not specified: Secondary | ICD-10-CM

## 2020-01-02 DIAGNOSIS — D649 Anemia, unspecified: Secondary | ICD-10-CM | POA: Diagnosis present

## 2020-01-02 DIAGNOSIS — Z8542 Personal history of malignant neoplasm of other parts of uterus: Secondary | ICD-10-CM | POA: Diagnosis not present

## 2020-01-02 DIAGNOSIS — I10 Essential (primary) hypertension: Secondary | ICD-10-CM | POA: Diagnosis not present

## 2020-01-02 DIAGNOSIS — K219 Gastro-esophageal reflux disease without esophagitis: Secondary | ICD-10-CM | POA: Diagnosis present

## 2020-01-02 DIAGNOSIS — M25551 Pain in right hip: Secondary | ICD-10-CM | POA: Diagnosis present

## 2020-01-02 DIAGNOSIS — I129 Hypertensive chronic kidney disease with stage 1 through stage 4 chronic kidney disease, or unspecified chronic kidney disease: Secondary | ICD-10-CM | POA: Diagnosis present

## 2020-01-02 DIAGNOSIS — S72001A Fracture of unspecified part of neck of right femur, initial encounter for closed fracture: Secondary | ICD-10-CM | POA: Diagnosis present

## 2020-01-02 DIAGNOSIS — Z90711 Acquired absence of uterus with remaining cervical stump: Secondary | ICD-10-CM | POA: Diagnosis not present

## 2020-01-02 DIAGNOSIS — I2510 Atherosclerotic heart disease of native coronary artery without angina pectoris: Secondary | ICD-10-CM | POA: Diagnosis present

## 2020-01-02 DIAGNOSIS — M62838 Other muscle spasm: Secondary | ICD-10-CM | POA: Diagnosis present

## 2020-01-02 DIAGNOSIS — Z853 Personal history of malignant neoplasm of breast: Secondary | ICD-10-CM | POA: Diagnosis not present

## 2020-01-02 DIAGNOSIS — Z7983 Long term (current) use of bisphosphonates: Secondary | ICD-10-CM | POA: Diagnosis not present

## 2020-01-02 DIAGNOSIS — Z79810 Long term (current) use of selective estrogen receptor modulators (SERMs): Secondary | ICD-10-CM | POA: Diagnosis not present

## 2020-01-02 DIAGNOSIS — S72001D Fracture of unspecified part of neck of right femur, subsequent encounter for closed fracture with routine healing: Secondary | ICD-10-CM | POA: Diagnosis not present

## 2020-01-02 DIAGNOSIS — N179 Acute kidney failure, unspecified: Secondary | ICD-10-CM | POA: Diagnosis not present

## 2020-01-02 DIAGNOSIS — Z20822 Contact with and (suspected) exposure to covid-19: Secondary | ICD-10-CM | POA: Diagnosis present

## 2020-01-02 DIAGNOSIS — Z79899 Other long term (current) drug therapy: Secondary | ICD-10-CM | POA: Diagnosis not present

## 2020-01-02 DIAGNOSIS — W010XXA Fall on same level from slipping, tripping and stumbling without subsequent striking against object, initial encounter: Secondary | ICD-10-CM | POA: Diagnosis present

## 2020-01-02 LAB — CBC
HCT: 28.9 % — ABNORMAL LOW (ref 36.0–46.0)
Hemoglobin: 9.6 g/dL — ABNORMAL LOW (ref 12.0–15.0)
MCH: 31.7 pg (ref 26.0–34.0)
MCHC: 33.2 g/dL (ref 30.0–36.0)
MCV: 95.4 fL (ref 80.0–100.0)
Platelets: 173 10*3/uL (ref 150–400)
RBC: 3.03 MIL/uL — ABNORMAL LOW (ref 3.87–5.11)
RDW: 12.6 % (ref 11.5–15.5)
WBC: 7.7 10*3/uL (ref 4.0–10.5)
nRBC: 0 % (ref 0.0–0.2)

## 2020-01-02 LAB — BASIC METABOLIC PANEL
Anion gap: 10 (ref 5–15)
BUN: 27 mg/dL — ABNORMAL HIGH (ref 8–23)
CO2: 25 mmol/L (ref 22–32)
Calcium: 8.2 mg/dL — ABNORMAL LOW (ref 8.9–10.3)
Chloride: 106 mmol/L (ref 98–111)
Creatinine, Ser: 1.74 mg/dL — ABNORMAL HIGH (ref 0.44–1.00)
GFR calc Af Amer: 31 mL/min — ABNORMAL LOW (ref >60)
GFR calc non Af Amer: 26 mL/min — ABNORMAL LOW (ref >60)
Glucose, Bld: 92 mg/dL (ref 70–99)
Potassium: 4.7 mmol/L (ref 3.5–5.1)
Sodium: 141 mmol/L (ref 135–145)

## 2020-01-02 LAB — APTT
aPTT: 41 seconds — ABNORMAL HIGH (ref 24–36)
aPTT: 134 seconds — ABNORMAL HIGH (ref 24–36)

## 2020-01-02 LAB — SARS CORONAVIRUS 2 BY RT PCR (HOSPITAL ORDER, PERFORMED IN ~~LOC~~ HOSPITAL LAB): SARS Coronavirus 2: NEGATIVE

## 2020-01-02 LAB — PROTIME-INR
INR: 1.2 (ref 0.8–1.2)
Prothrombin Time: 14.8 seconds (ref 11.4–15.2)

## 2020-01-02 LAB — HEPARIN LEVEL (UNFRACTIONATED): Heparin Unfractionated: 1.7 IU/mL — ABNORMAL HIGH (ref 0.30–0.70)

## 2020-01-02 MED ORDER — CRANBERRY 500 MG PO TABS: ORAL_TABLET | Freq: Two times a day (BID) | ORAL | Status: DC

## 2020-01-02 MED ORDER — ONDANSETRON HCL 4 MG/2ML IJ SOLN
4.0000 mg | Freq: Four times a day (QID) | INTRAMUSCULAR | Status: DC | PRN
  Administered 2020-01-02: 4 mg via INTRAVENOUS
  Filled 2020-01-02: qty 2

## 2020-01-02 MED ORDER — SODIUM CHLORIDE 0.9 % IV SOLN: INTRAVENOUS | Status: DC

## 2020-01-02 MED ORDER — ONDANSETRON HCL 4 MG PO TABS: 4.0000 mg | ORAL_TABLET | Freq: Four times a day (QID) | ORAL | Status: DC | PRN

## 2020-01-02 MED ORDER — PROBIOTIC ADVANCED PO CAPS: 1.0000 | ORAL_CAPSULE | Freq: Every day | ORAL | Status: DC

## 2020-01-02 MED ORDER — TAMOXIFEN CITRATE 10 MG PO TABS
20.0000 mg | ORAL_TABLET | Freq: Every day | ORAL | Status: DC
  Administered 2020-01-02 – 2020-01-06 (×5): 20 mg via ORAL
  Filled 2020-01-02 (×6): qty 2

## 2020-01-02 MED ORDER — DOCUSATE SODIUM 100 MG PO CAPS
100.0000 mg | ORAL_CAPSULE | Freq: Two times a day (BID) | ORAL | Status: DC
  Administered 2020-01-02 – 2020-01-07 (×8): 100 mg via ORAL
  Filled 2020-01-02 (×8): qty 1

## 2020-01-02 MED ORDER — FERROUS GLUCONATE 324 (38 FE) MG PO TABS
324.0000 mg | ORAL_TABLET | Freq: Every day | ORAL | Status: DC
  Administered 2020-01-02 – 2020-01-07 (×5): 324 mg via ORAL
  Filled 2020-01-02 (×7): qty 1

## 2020-01-02 MED ORDER — LOSARTAN POTASSIUM 50 MG PO TABS
50.0000 mg | ORAL_TABLET | Freq: Every day | ORAL | Status: DC
  Administered 2020-01-02 – 2020-01-03 (×2): 50 mg via ORAL
  Filled 2020-01-02 (×3): qty 1

## 2020-01-02 MED ORDER — FUROSEMIDE 20 MG PO TABS: 20.0000 mg | ORAL_TABLET | Freq: Every day | ORAL | Status: DC | PRN

## 2020-01-02 MED ORDER — ACETAMINOPHEN 650 MG RE SUPP: 650.0000 mg | Freq: Four times a day (QID) | RECTAL | Status: DC | PRN

## 2020-01-02 MED ORDER — VITAMIN D 25 MCG (1000 UNIT) PO TABS
1000.0000 [IU] | ORAL_TABLET | Freq: Every day | ORAL | Status: DC
  Administered 2020-01-02 – 2020-01-07 (×5): 1000 [IU] via ORAL
  Filled 2020-01-02 (×5): qty 1

## 2020-01-02 MED ORDER — PANTOPRAZOLE SODIUM 40 MG PO TBEC
40.0000 mg | DELAYED_RELEASE_TABLET | Freq: Every day | ORAL | Status: DC
  Administered 2020-01-02 – 2020-01-07 (×5): 40 mg via ORAL
  Filled 2020-01-02 (×5): qty 1

## 2020-01-02 MED ORDER — PRAVASTATIN SODIUM 20 MG PO TABS
40.0000 mg | ORAL_TABLET | Freq: Every day | ORAL | Status: DC
  Administered 2020-01-02 – 2020-01-06 (×5): 40 mg via ORAL
  Filled 2020-01-02 (×2): qty 1
  Filled 2020-01-02 (×4): qty 2
  Filled 2020-01-02: qty 1
  Filled 2020-01-02: qty 2

## 2020-01-02 MED ORDER — TRAZODONE HCL 50 MG PO TABS: 25.0000 mg | ORAL_TABLET | Freq: Every evening | ORAL | Status: DC | PRN

## 2020-01-02 MED ORDER — APIXABAN 2.5 MG PO TABS: 2.5000 mg | ORAL_TABLET | Freq: Two times a day (BID) | ORAL | Status: DC

## 2020-01-02 MED ORDER — ACETAMINOPHEN 325 MG PO TABS
650.0000 mg | ORAL_TABLET | Freq: Four times a day (QID) | ORAL | Status: DC | PRN
  Administered 2020-01-04 – 2020-01-06 (×4): 650 mg via ORAL
  Filled 2020-01-02 (×4): qty 2

## 2020-01-02 MED ORDER — METOPROLOL SUCCINATE ER 25 MG PO TB24
25.0000 mg | ORAL_TABLET | Freq: Every day | ORAL | Status: DC
  Administered 2020-01-02 – 2020-01-06 (×5): 25 mg via ORAL
  Filled 2020-01-02 (×5): qty 1

## 2020-01-02 MED ORDER — ALENDRONATE SODIUM 70 MG PO TABS: 70.0000 mg | ORAL_TABLET | ORAL | Status: DC

## 2020-01-02 MED ORDER — OXYCODONE HCL 5 MG PO TABS
5.0000 mg | ORAL_TABLET | ORAL | Status: DC | PRN
  Administered 2020-01-02 – 2020-01-04 (×6): 5 mg via ORAL
  Filled 2020-01-02 (×6): qty 1

## 2020-01-02 MED ORDER — MAGNESIUM HYDROXIDE 400 MG/5ML PO SUSP: 30.0000 mL | Freq: Every day | ORAL | Status: DC | PRN

## 2020-01-02 MED ORDER — MORPHINE SULFATE (PF) 2 MG/ML IV SOLN
2.0000 mg | INTRAVENOUS | Status: DC | PRN
  Administered 2020-01-02 (×3): 2 mg via INTRAVENOUS
  Filled 2020-01-02 (×3): qty 1

## 2020-01-02 MED ORDER — HEPARIN (PORCINE) 25000 UT/250ML-% IV SOLN
800.0000 [IU]/h | INTRAVENOUS | Status: DC
  Administered 2020-01-02: 1000 [IU]/h via INTRAVENOUS
  Filled 2020-01-02: qty 250

## 2020-01-02 MED ORDER — CALCIUM CARBONATE-VITAMIN D 500-200 MG-UNIT PO TABS
1.0000 | ORAL_TABLET | Freq: Two times a day (BID) | ORAL | Status: DC
  Administered 2020-01-02 – 2020-01-07 (×10): 1 via ORAL
  Filled 2020-01-02 (×10): qty 1

## 2020-01-02 NOTE — H&P (Signed)
[facility] NAME: Arlett Goold    MR#:  [id number]  DATE OF BIRTH:  September 03, 1933  DATE OF ADMISSION:  01/01/2020  PRIMARY CARE PHYSICIAN: Idelle Crouch, MD   REQUESTING/REFERRING PHYSICIAN: Conni Slipper, MD  CHIEF COMPLAINT:   Chief Complaint  Patient presents with  . Fall    HISTORY OF PRESENT ILLNESS:  Jameyah Fennewald  is a 86 y.o. Caucasian female with a known history of coronary artery disease, GERD, hypertension, stage IV chronic kidney disease and obstructive sleep apnea, who presented to the emergency room with acute onset of accidental mechanical fall on Friday.  She stumbled and got tripped up in her walker.  She was having right hip and right foot pain after falling.  She was not able to bear weight on her right foot.  She denies any headache or dizziness or blurred vision, paresthesias or focal muscle weakness.  No chest pain or dyspnea or palpitations.  No head injuries.  She denies any fever or chills.  No dysuria, urinary frequency or urgency, oliguria or hematuria or flank pain.  Upon presentation to the emergency room, vital signs were within normal.  Labs revealed a BUN of 27 creatinine 1.95 close to her baseline CBC showed anemia with hemoglobin 9.4 and hematocrit 29.5 worse than her previous levels in 2018.  UA with suspicious for UTI.  Head CT scan showed chronic microvascular disease and cerebral atrophy with no acute intracranial abnormalities.  Right femur x-ray showed no fracture or dislocation but just osteoarthritic change most severe in the hip joint.  Foot x-ray showed suspected residua of old trauma in the calcaneus with no fracture or dislocation.  It showed pes planus and calcification the medial first MTP joint likely due to to calcium pyrophosphate deposition disease.  It shows osteoporosis.  Pelvic x-ray showed severe osteoarthritic change in the right hip joint with osteoporotic bones without fracture or dislocation.   Right hip/fib x-ray showed osteoporotic bones, knee and ankle joint osteoarthritic changes with no fracture or dislocation.  CT of the right hip showed asymmetric osteoarthritis in the right hip with no acute fracture or dislocation.  Right foot CT showed no fracture.  Right hip MRI showed: 1. Positive for severe asymmetric right hip osteoarthritis, with both low level degenerative appearing subchondral marrow edema about the joint, but also a suspicious area of bone edema at the lower right femoral neck. Suspect an impending and/or incomplete fracture of the lower neck.  2. Small but asymmetric right hip joint effusion.  3. Elsewhere the pelvis and left hip appear intact.  The patient was given 50 mg p.o. tramadol and 3 g packet of fosfomycin for suspected UTI.  She will be admitted to a med-surg bed for further evaluation and management.  PAST MEDICAL HISTORY:   Past Medical History:  Diagnosis Date  . Anemia    is followed by renal doctor and pcp  . Arthritis   . Breast cancer ([facility]) 04/2016   Rt. breast ca, f/u with radiation  . Cancer [facility]) 1961   uterine cancer  . Coronary artery disease   . Dyspnea   . GERD (gastroesophageal reflux disease)   . Hypertension   . Kidney disease    stage 4.  followed by dr. Sedonia Small  . Lower extremity edema   . Personal history of radiation therapy   . S/P lumpectomy, right breast    having done on 04/26/16  . Sleep apnea  uses cpap    PAST SURGICAL HISTORY:   Past Surgical History:  Procedure Laterality Date  . BREAST BIOPSY Right 03/2016   invasive mammary carcinoma  . BREAST LUMPECTOMY Right    04/26/16, f/u with radiation  . CAROTID STENT Left 2016   was having pain down neck. carotid artery clogged. dr. Delana Meyer put stent in  . CAROTID STENT Left   . CATARACT EXTRACTION W/ INTRAOCULAR LENS  IMPLANT, BILATERAL Bilateral 2009  . EYE SURGERY  2009   cataracts  . PARTIAL HYSTERECTOMY  1961   uterus only removed  . PARTIAL  MASTECTOMY WITH NEEDLE LOCALIZATION Right 04/26/2016   Procedure: PARTIAL MASTECTOMY WITH NEEDLE LOCALIZATION;  Surgeon: Leonie Green, MD;  Location: [facility] ORS;  Service: General;  Laterality: Right;  . SENTINEL NODE BIOPSY Right 04/26/2016   Procedure: SENTINEL NODE BIOPSY;  Surgeon: Leonie Green, MD;  Location: [facility] ORS;  Service: General;  Laterality: Right;    SOCIAL HISTORY:   Social History   Tobacco Use  . Smoking status: Never Smoker  . Smokeless tobacco: Never Used  Substance Use Topics  . Alcohol use: No    FAMILY HISTORY:   Family History  Problem Relation Age of Onset  . Bladder Cancer Neg Hx   . Kidney cancer Neg Hx   . Prolactinoma Neg Hx   . Prostate cancer Neg Hx     DRUG ALLERGIES:   Allergies  Allergen Reactions  . Cefuroxime Axetil Diarrhea  . Codeine Nausea Only    Intolerance instead of true allerg  . Sulfa Antibiotics Swelling and Rash    swelling    REVIEW OF SYSTEMS:   ROS As per history of present illness. All pertinent systems were reviewed above. Constitutional, HEENT, cardiovascular, respiratory, GI, GU, musculoskeletal, neuro, psychiatric, endocrine, integumentary and hematologic systems were reviewed and are otherwise negative/unremarkable except for positive findings mentioned above in the HPI.   MEDICATIONS AT HOME:   Prior to Admission medications   Medication Sig Start Date End Date Taking? Authorizing Provider  acetaminophen (TYLENOL) 500 MG tablet Take 1,000 mg by mouth daily. May take an additional 1000 mgs as needed for pain    [provider]  alendronate (FOSAMAX) 70 MG tablet Take 1 tablet (70 mg total) by mouth once a week. Take with a full glass of water on an empty stomach. 12/18/17   Jacquelin Hawking, NP  apixaban (ELIQUIS) 2.5 MG TABS tablet Take 1 tablet by mouth 2 (two) times a day. 12/09/18   [provider]  calcium-vitamin D (OSCAL WITH D) 500-200 MG-UNIT tablet Take 1 tablet by mouth 2  (two) times daily. 09/01/16   Lloyd Huger, MD  Cholecalciferol (VITAMIN D3) 1000 units CAPS Take 1,000 Units by mouth daily.     [provider]  Cranberry 500 MG TABS Take by mouth 2 (two) times daily.    [provider]  Cyanocobalamin (VITAMIN B-12 IJ) Inject 1,000 mcg as directed every 30 (thirty) days.    [provider]  docusate sodium (STOOL SOFTENER) 100 MG capsule Take 100 mg by mouth 2 (two) times daily.    [provider]  esomeprazole (NEXIUM) 40 MG capsule Take 40 mg by mouth daily.    [provider]  ferrous gluconate (FERGON) 324 MG tablet Take 324 mg by mouth daily.    [provider]  furosemide (LASIX) 20 MG tablet Take 20 mg by mouth daily as needed for edema.  [provider]  losartan (COZAAR) 50 MG tablet Take 50 mg by mouth daily.    [provider]  lovastatin (MEVACOR) 40 MG tablet Take 40 mg by mouth 2 (two) times daily.    [provider]  metoprolol succinate (TOPROL-XL) 25 MG 24 hr tablet Take by mouth. 02/22/18   [provider]  Probiotic Product (PROBIOTIC ADVANCED) CAPS Take by mouth.    [provider]  risedronate (ACTONEL) 35 MG tablet TAKE 1 TABLET EVERY 14 DAYS 03/14/19   Lloyd Huger, MD  tamoxifen (NOLVADEX) 20 MG tablet TAKE 1 TABLET (20 MG TOTAL) BY MOUTH DAILY. 07/05/19   Lloyd Huger, MD      VITAL SIGNS:  Blood pressure 132/70, pulse 61, temperature 98 F (36.7 C), temperature source Oral, resp. rate 18, height 5\' 3"  (1.6 m), weight 81.6 kg, SpO2 97 %.  PHYSICAL EXAMINATION:  Physical Exam  GENERAL:  86 y.o.-year-old Caucasian female patient lying in the bed with no acute distress.  EYES: Pupils equal, round, reactive to light and accommodation. No scleral icterus. Extraocular muscles intact.  HEENT: Head atraumatic, normocephalic. Oropharynx and nasopharynx clear.  NECK:  Supple, no jugular venous distention. No thyroid  enlargement, no tenderness.  LUNGS: Normal breath sounds bilaterally, no wheezing, rales,rhonchi or crepitation. No use of accessory muscles of respiration.  CARDIOVASCULAR: Regular rate and rhythm, S1, S2 normal. No murmurs, rubs, or gallops.  ABDOMEN: Soft, nondistended, nontender. Bowel sounds present. No organomegaly or mass.  EXTREMITIES: No pedal edema, cyanosis, or clubbing.  NEUROLOGIC: Cranial nerves II through XII are intact. Muscle strength 5/5 in all extremities. Sensation intact. Gait not checked. Musculoskeletal: Right hip tenderness. PSYCHIATRIC: The patient is alert and oriented x 3.  Normal affect and good eye contact. SKIN: No obvious rash, lesion, or ulcer.   LABORATORY PANEL:   CBC Recent Labs  Lab 01/01/20 1344  WBC 8.0  HGB 9.4*  HCT 29.5*  PLT 166   ------------------------------------------------------------------------------------------------------------------  Chemistries  Recent Labs  Lab 01/01/20 1344  NA 140  K 4.5  CL 108  CO2 21*  GLUCOSE 101*  BUN 27*  CREATININE 1.95*  CALCIUM 7.9*   ------------------------------------------------------------------------------------------------------------------  Cardiac Enzymes No results for input(s): TROPONINI in the last 168 hours. ------------------------------------------------------------------------------------------------------------------  RADIOLOGY:  DG Pelvis 1-2 Views  Result Date: 01/01/2020 CLINICAL DATA:  Pain following fall EXAM: PELVIS - 1-2 VIEW COMPARISON:  None. FINDINGS: There is no evidence of pelvic fracture or dislocation. There is marked narrowing of the right hip joint. Left hip joint appears essentially unremarkable. Sacroiliac joints appear unremarkable bilaterally. Bones osteoporotic. IMPRESSION: Severe osteoarthritic change in the right hip joint. Bones osteoporotic. No fracture or dislocation. Electronically Signed   By: Lowella Grip III M.D.   On: 01/01/2020 14:02    DG Tibia/Fibula Right  Result Date: 01/01/2020 CLINICAL DATA:  Pain following fall EXAM: RIGHT TIBIA AND FIBULA - 2 VIEW COMPARISON:  None. FINDINGS: Frontal and lateral views were obtained. No appreciable fracture or dislocation. Bones are osteoporotic. No abnormal periosteal reaction. Mild generalized joint space narrowing. IMPRESSION: No fracture or dislocation. Bones osteoporotic. Knee and ankle joint osteoarthritic changes noted. Electronically Signed   By: Lowella Grip III M.D.   On: 01/01/2020 14:03   CT Head Wo Contrast  Result Date: 01/01/2020 CLINICAL DATA:  Status post fall 5 ago 12/27/2019 EXAM: CT HEAD WITHOUT CONTRAST TECHNIQUE: Contiguous axial images were obtained from the base of the skull through the vertex without intravenous contrast. COMPARISON:  08/05/2010 FINDINGS: Brain: No evidence of acute infarction, hemorrhage, extra-axial collection, ventriculomegaly, or mass effect. Generalized cerebral atrophy. Periventricular white matter low attenuation likely secondary to microangiopathy. Vascular: Cerebrovascular atherosclerotic calcifications are noted. Skull: Negative for fracture or focal lesion. Sinuses/Orbits: Visualized portions of the orbits are unremarkable. Visualized portions of the paranasal sinuses are unremarkable. Visualized portions of the mastoid air cells are unremarkable. Other: None. IMPRESSION: 1. No acute intracranial pathology. 2. Chronic microvascular disease and cerebral atrophy. Electronically Signed   By: Kathreen Devoid   On: 01/01/2020 13:39   CT Hip Right Wo Contrast  Result Date: 01/01/2020 CLINICAL DATA:  Right hip and foot pain with shortening, rotation and swelling in the right foot after falling 5 days ago. EXAM: CT OF THE RIGHT HIP WITHOUT CONTRAST TECHNIQUE: Multidetector CT imaging of the right hip was performed according to the standard protocol. Multiplanar CT image reconstructions were also generated. COMPARISON:  Radiographs same date.   Pelvic CT 12/27/2013. FINDINGS: Bones/Joint/Cartilage There are advanced asymmetric osteoarthritic changes at the right hip which have progressed compared with the prior pelvic CT. There is advanced joint space narrowing with subchondral sclerosis, osteophytes and cyst formation, especially anteriorly. No evidence of acute fracture or dislocation. There is a small nonspecific right hip joint effusion with associated synovial thickening. No discrete intra-articular loose bodies are seen. Ligaments Suboptimally assessed by CT. Muscles and Tendons Mild gluteus muscular atrophy. No evidence of muscular hematoma or periarticular tendon tear. Soft tissues No evidence of periarticular hematoma or other fluid collection. Mild iliofemoral atherosclerosis. Probable pelvic floor laxity. IMPRESSION: 1. No evidence of acute fracture or dislocation. 2. Advanced asymmetric osteoarthritic changes at the right hip with small nonspecific right hip joint effusion and synovial thickening. 3. No focal periarticular hematoma. Electronically Signed   By: Richardean Sale M.D.   On: 01/01/2020 16:25   CT Foot Right Wo Contrast  Result Date: 01/01/2020 CLINICAL DATA:  Right foot pain since fall last Friday. EXAM: CT OF THE RIGHT FOOT WITHOUT CONTRAST TECHNIQUE: Multidetector CT imaging of the right foot was performed according to the standard protocol. Multiplanar CT image reconstructions were also generated. COMPARISON:  Right foot x-rays from same day. FINDINGS: Bones/Joint/Cartilage No acute fracture or dislocation. The ankle mortise is symmetric. The talar dome is intact. Lisfranc alignment is maintained. Severe pes planus with bony cystic change in the lateral talar process and adjacent calcaneus. Mild subtalar and midfoot osteoarthritis. Mild hallux valgus deformity and first MTP joint osteoarthritis. Osteopenia. Small tibiotalar and posterior subtalar joint effusions. Scattered increased periarticular mineralization adjacent to  the medial and lateral malleoli and first MTP and first IP joints. Ligaments Ligaments are suboptimally evaluated by CT. Muscles and Tendons Grossly intact.  Atrophy of the intrinsic foot muscles. Soft tissue Diffuse soft tissue swelling. No fluid collection or hematoma. No soft tissue mass. IMPRESSION: 1. No acute osseous abnormality. 2. Severe pes planus with evidence of lateral hindfoot impingement. Electronically Signed   By: Titus Dubin M.D.   On: 01/01/2020 16:36   MR HIP RIGHT WO CONTRAST  Result Date: 01/01/2020 CLINICAL DATA:  86 year old female with fall and unable to weightbear on the right hip. However, CT of the right hip earlier today negative for acute fracture or dislocation. EXAM: MR OF THE RIGHT HIP WITHOUT CONTRAST TECHNIQUE: Multiplanar, multisequence MR imaging was performed. No intravenous contrast was administered. COMPARISON:  CT right hip 1544 hours today. FINDINGS: T1 weighted and fluid sensitive sequences of the pelvis demonstrate normal background bone marrow signal. Sacrum  appears intact. The left hip appears normal. Lower lumbar disc and endplate degeneration incidentally noted. Severe right hip osteoarthritis is redemonstrated with a 15 mm area of degenerative appearing marrow edema and/or fluid fluid at the anterior superior acetabulum (series 8, image 6 and series 5, image 22) that most resembles reactive changes around a degenerative subchondral cyst. But furthermore, there is conspicuous marrow edema forming a ring around the right lower femoral neck, junction with the intertrochanteric segment (series 6, image 18 and series 9, image 18). And close scrutiny of this area on the earlier CT (series 3, images 38 and 39 of that exam) raises the possibility of a nondisplaced, incomplete fracture. However, on these coronal T1 weighted images of this study there is no obvious corresponding horizontal fracture plane. Superimposed small but asymmetric right hip joint effusion is  again noted. No muscle edema identified about the right hip or pelvis. Negative visible lower abdominal and pelvic viscera. IMPRESSION: 1. Positive for severe asymmetric right hip osteoarthritis, with both low level degenerative appearing subchondral marrow edema about the joint, but also a suspicious area of bone edema at the lower right femoral neck. Suspect an impending and/or incomplete fracture of the lower neck. 2. Small but asymmetric right hip joint effusion. 3. Elsewhere the pelvis and left hip appear intact. Study discussed by telephone with Dr. Conni Slipper on 01/01/2020 at 23:44 . Electronically Signed   By: Genevie Ann M.D.   On: 01/01/2020 23:43   DG Foot Complete Right  Result Date: 01/01/2020 CLINICAL DATA:  Pain following fall EXAM: RIGHT FOOT COMPLETE - 3+ VIEW COMPARISON:  None. FINDINGS: Frontal, oblique, and lateral views were obtained. There is no acute fracture or dislocation. Remodeling of the calcaneus suggests prior trauma in this area. Bones are diffusely osteoporotic. There is calcification in the medial aspect of the first MTP joint. There is diffuse osteoarthritic change in the subtalar joints. Other joint spaces appear unremarkable. There is pes planus. IMPRESSION: Suspect residua of old trauma in the calcaneus. No acute fracture or dislocation. Pes planus. Calcification in the medial first MTP joint likely is due to calcium pyrophosphate deposition disease. Bones osteoporotic. Electronically Signed   By: Lowella Grip III M.D.   On: 01/01/2020 14:01   DG Femur Min 2 Views Right  Result Date: 01/01/2020 CLINICAL DATA:  Pain following fall EXAM: RIGHT FEMUR 2 VIEWS COMPARISON:  None. FINDINGS: Frontal and lateral views were obtained. No fracture or dislocation. No abnormal periosteal reaction. There is severe narrowing of the right hip joint. There is milder arthropathy in the knee region. IMPRESSION: No fracture or dislocation. Osteoarthritic change, most severe in the hip joint  region. Electronically Signed   By: Lowella Grip III M.D.   On: 01/01/2020 14:00      IMPRESSION AND PLAN:   1.  Suspected right hip fracture. -The patient will be admitted to  a medical-surgical bed. -Pain management will be provided. -Orthopedic consultation will be obtained. -Dr. Mack Guise was notified but the patient.  2.  Possible UTI that could be asymptomatic bacteriuria. -The patient was already given p.o. fosfomycin. -Urine culture will be followed.  3.  Hypertension. -We will continue her antihypertensives.  4.  Stage IV chronic kidney disease, stable. -She will be hydrated with IV normal saline.  5.  GERD. -PPI therapy will be resumed.  6.  Dyslipidemia. -We will continue statin therapy.  7.  DVT prophylaxis. -SCDs.  Medical prophylaxis is currently postponed for potential surgical intervention.  All the records are reviewed and case discussed with ED provider. The plan of care was discussed in details with the patient (and family). I answered all questions. The patient agreed to proceed with the above mentioned plan. Further management will depend upon hospital course.   CODE STATUS: Full code  Status is: Inpatient  Remains inpatient appropriate because:Ongoing active pain requiring inpatient pain management, Ongoing diagnostic testing needed not appropriate for outpatient work up, Unsafe d/c plan, IV treatments appropriate due to intensity of illness or inability to take PO and Inpatient level of care appropriate due to severity of illness   Dispo: The patient is from: Home              Anticipated d/c is to: SNF              Anticipated d/c date is: 3 days              Patient currently is not medically stable to d/c.   TOTAL TIME TAKING CARE OF THIS PATIENT: 55 minutes.    Christel Mormon M.D on 01/02/2020 at 1:19 AM  [facility]   From 7 PM-7 AM, contact night-coverage [email or web address]  CC: Primary care physician; Idelle Crouch,  MD   Note: This dictation was prepared with Dragon dictation along with smaller phrase technology. Any transcriptional typo errors that result from this process are unintentional.

## 2020-01-02 NOTE — ED Notes (Signed)
Attempted to call report at this time 

## 2020-01-02 NOTE — Progress Notes (Signed)
PT Cancellation Note  Patient Details Name: Rachel Jones MRN: [id number] DOB: 1933-11-05   Cancelled Treatment:    Reason Eval/Treat Not Completed: Patient not medically ready. Patient has order for ortho consult which has not been completed yet. Will continue to follow and evaluate as appropriate.      Lejuan Botto 01/02/2020, 10:07 AM

## 2020-01-02 NOTE — TOC Progression Note (Signed)
Transition of Care [facility]) - Progression Note    Patient Details  Name: Rachel Jones MRN: [id number] Date of Birth: [birth year]  Transition of Care [facility]) CM/SW Contact  Sumeet Geter, Gardiner Rhyme, LCSW Phone Number: 01/02/2020, 10:47 AM  Clinical Narrative:  Met with pt and daughter in-law who was in the room, waiting for Dr. Tanja Port to see regarding possible surgery. She lives alone and was independent prior to admission with her rolling walker. She has a lifeline she doesn;t always use. Her knees give out and she falls, she is aware of the safety issues with her frequent falls. She has two son's and daughter in-law who are supportive and involved but can not provide 24 hr care at discharge. Pt may need rehab before returning home and discussed the process with this option. Pt has been vaccinated and would prefer [facility] if she can when this is needed. Will await ortho consult and work on best and safest plan for pt. Daughter in-law aware another social worker will be covering due to my absence           Expected Discharge Plan and Services                                                 Social Determinants of Health (SDOH) Interventions    Readmission Risk Interventions No flowsheet data found.

## 2020-01-02 NOTE — Progress Notes (Addendum)
[facility] [place] NAME: Rachel Jones    MR#:  [id number]  DATE OF BIRTH:  Jan 26, 1934  SUBJECTIVE:  patient came in after patient came in after fall and right hip pain. She has been going to orthopedic for ongoing right hip arthritis. She also has been receiving some corticosteroid injections. Denies any tingling numbness.  MRI suggested nondisplaced fracture of the right femoral neck  Daughter in the room patient reports taking eliquis for more than a year for her lower extremity DVT. She is recommended by her primary care and cardiologist to continue it indefinitely REVIEW OF SYSTEMS:   Review of Systems  Constitutional: Negative for chills, fever and weight loss.  HENT: Negative for ear discharge, ear pain and nosebleeds.   Eyes: Negative for blurred vision, pain and discharge.  Respiratory: Negative for sputum production, shortness of breath, wheezing and stridor.   Cardiovascular: Negative for chest pain, palpitations, orthopnea and PND.  Gastrointestinal: Negative for abdominal pain, diarrhea, nausea and vomiting.  Genitourinary: Negative for frequency and urgency.  Musculoskeletal: Positive for falls and joint pain. Negative for back pain.  Neurological: Positive for weakness. Negative for sensory change, speech change and focal weakness.  Psychiatric/Behavioral: Negative for depression and hallucinations. The patient is not nervous/anxious.    Tolerating Diet:yes Tolerating PT: pending surgery  DRUG ALLERGIES:   Allergies  Allergen Reactions  . Cefuroxime Axetil Diarrhea  . Codeine Nausea Only    Intolerance instead of true allerg  . Sulfa Antibiotics Swelling and Rash    swelling    VITALS:  Blood pressure (!) 144/70, pulse 85, temperature 98.1 F (36.7 C), temperature source Oral, resp. rate 18, height 5\' 3"  (1.6 m), weight 81.6 kg, SpO2 98 %.  PHYSICAL EXAMINATION:   Physical Exam  GENERAL:  86 y.o.-year-old  patient lying in the bed with no acute distress.  EYES: Pupils equal, round, reactive to light and accommodation. No scleral icterus.   HEENT: Head atraumatic, normocephalic. Oropharynx and nasopharynx clear.  NECK:  Supple, no jugular venous distention. No thyroid enlargement, no tenderness.  LUNGS: Normal breath sounds bilaterally, no wheezing, rales, rhonchi. No use of accessory muscles of respiration.  CARDIOVASCULAR: S1, S2 normal. No murmurs, rubs, or gallops.  ABDOMEN: Soft, nontender, nondistended. Bowel sounds present. No organomegaly or mass.  EXTREMITIES: No cyanosis, clubbing or edema b/l.   Right lower extremity restricted range of motion. Pain present. NEUROLOGIC: Cranial nerves II through XII are intact. No focal Motor or sensory deficits b/l.   PSYCHIATRIC:  patient is alert and oriented x 3.  SKIN: No obvious rash, lesion, or ulcer.   LABORATORY PANEL:  CBC Recent Labs  Lab 01/02/20 0343  WBC 7.7  HGB 9.6*  HCT 28.9*  PLT 173    Chemistries  Recent Labs  Lab 01/02/20 0343  NA 141  K 4.7  CL 106  CO2 25  GLUCOSE 92  BUN 27*  CREATININE 1.74*  CALCIUM 8.2*   Cardiac Enzymes No results for input(s): TROPONINI in the last 168 hours. RADIOLOGY:  DG Pelvis 1-2 Views  Result Date: 01/01/2020 CLINICAL DATA:  Pain following fall EXAM: PELVIS - 1-2 VIEW COMPARISON:  None. FINDINGS: There is no evidence of pelvic fracture or dislocation. There is marked narrowing of the right hip joint. Left hip joint appears essentially unremarkable. Sacroiliac joints appear unremarkable bilaterally. Bones osteoporotic. IMPRESSION: Severe osteoarthritic change in the right hip joint. Bones osteoporotic. No fracture or dislocation. Electronically Signed  By: Lowella Grip III M.D.   On: 01/01/2020 14:02   DG Tibia/Fibula Right  Result Date: 01/01/2020 CLINICAL DATA:  Pain following fall EXAM: RIGHT TIBIA AND FIBULA - 2 VIEW COMPARISON:  None. FINDINGS: Frontal and lateral views  were obtained. No appreciable fracture or dislocation. Bones are osteoporotic. No abnormal periosteal reaction. Mild generalized joint space narrowing. IMPRESSION: No fracture or dislocation. Bones osteoporotic. Knee and ankle joint osteoarthritic changes noted. Electronically Signed   By: Lowella Grip III M.D.   On: 01/01/2020 14:03   CT Head Wo Contrast  Result Date: 01/01/2020 CLINICAL DATA:  Status post fall 5 ago 12/27/2019 EXAM: CT HEAD WITHOUT CONTRAST TECHNIQUE: Contiguous axial images were obtained from the base of the skull through the vertex without intravenous contrast. COMPARISON:  08/05/2010 FINDINGS: Brain: No evidence of acute infarction, hemorrhage, extra-axial collection, ventriculomegaly, or mass effect. Generalized cerebral atrophy. Periventricular white matter low attenuation likely secondary to microangiopathy. Vascular: Cerebrovascular atherosclerotic calcifications are noted. Skull: Negative for fracture or focal lesion. Sinuses/Orbits: Visualized portions of the orbits are unremarkable. Visualized portions of the paranasal sinuses are unremarkable. Visualized portions of the mastoid air cells are unremarkable. Other: None. IMPRESSION: 1. No acute intracranial pathology. 2. Chronic microvascular disease and cerebral atrophy. Electronically Signed   By: Kathreen Devoid   On: 01/01/2020 13:39   CT Hip Right Wo Contrast  Result Date: 01/01/2020 CLINICAL DATA:  Right hip and foot pain with shortening, rotation and swelling in the right foot after falling 5 days ago. EXAM: CT OF THE RIGHT HIP WITHOUT CONTRAST TECHNIQUE: Multidetector CT imaging of the right hip was performed according to the standard protocol. Multiplanar CT image reconstructions were also generated. COMPARISON:  Radiographs same date.  Pelvic CT 12/27/2013. FINDINGS: Bones/Joint/Cartilage There are advanced asymmetric osteoarthritic changes at the right hip which have progressed compared with the prior pelvic CT. There  is advanced joint space narrowing with subchondral sclerosis, osteophytes and cyst formation, especially anteriorly. No evidence of acute fracture or dislocation. There is a small nonspecific right hip joint effusion with associated synovial thickening. No discrete intra-articular loose bodies are seen. Ligaments Suboptimally assessed by CT. Muscles and Tendons Mild gluteus muscular atrophy. No evidence of muscular hematoma or periarticular tendon tear. Soft tissues No evidence of periarticular hematoma or other fluid collection. Mild iliofemoral atherosclerosis. Probable pelvic floor laxity. IMPRESSION: 1. No evidence of acute fracture or dislocation. 2. Advanced asymmetric osteoarthritic changes at the right hip with small nonspecific right hip joint effusion and synovial thickening. 3. No focal periarticular hematoma. Electronically Signed   By: Richardean Sale M.D.   On: 01/01/2020 16:25   CT Foot Right Wo Contrast  Result Date: 01/01/2020 CLINICAL DATA:  Right foot pain since fall last Friday. EXAM: CT OF THE RIGHT FOOT WITHOUT CONTRAST TECHNIQUE: Multidetector CT imaging of the right foot was performed according to the standard protocol. Multiplanar CT image reconstructions were also generated. COMPARISON:  Right foot x-rays from same day. FINDINGS: Bones/Joint/Cartilage No acute fracture or dislocation. The ankle mortise is symmetric. The talar dome is intact. Lisfranc alignment is maintained. Severe pes planus with bony cystic change in the lateral talar process and adjacent calcaneus. Mild subtalar and midfoot osteoarthritis. Mild hallux valgus deformity and first MTP joint osteoarthritis. Osteopenia. Small tibiotalar and posterior subtalar joint effusions. Scattered increased periarticular mineralization adjacent to the medial and lateral malleoli and first MTP and first IP joints. Ligaments Ligaments are suboptimally evaluated by CT. Muscles and Tendons Grossly intact.  Atrophy of  the intrinsic foot  muscles. Soft tissue Diffuse soft tissue swelling. No fluid collection or hematoma. No soft tissue mass. IMPRESSION: 1. No acute osseous abnormality. 2. Severe pes planus with evidence of lateral hindfoot impingement. Electronically Signed   By: Titus Dubin M.D.   On: 01/01/2020 16:36   MR HIP RIGHT WO CONTRAST  Result Date: 01/01/2020 CLINICAL DATA:  86 year old female with fall and unable to weightbear on the right hip. However, CT of the right hip earlier today negative for acute fracture or dislocation. EXAM: MR OF THE RIGHT HIP WITHOUT CONTRAST TECHNIQUE: Multiplanar, multisequence MR imaging was performed. No intravenous contrast was administered. COMPARISON:  CT right hip 1544 hours today. FINDINGS: T1 weighted and fluid sensitive sequences of the pelvis demonstrate normal background bone marrow signal. Sacrum appears intact. The left hip appears normal. Lower lumbar disc and endplate degeneration incidentally noted. Severe right hip osteoarthritis is redemonstrated with a 15 mm area of degenerative appearing marrow edema and/or fluid fluid at the anterior superior acetabulum (series 8, image 6 and series 5, image 22) that most resembles reactive changes around a degenerative subchondral cyst. But furthermore, there is conspicuous marrow edema forming a ring around the right lower femoral neck, junction with the intertrochanteric segment (series 6, image 18 and series 9, image 18). And close scrutiny of this area on the earlier CT (series 3, images 38 and 39 of that exam) raises the possibility of a nondisplaced, incomplete fracture. However, on these coronal T1 weighted images of this study there is no obvious corresponding horizontal fracture plane. Superimposed small but asymmetric right hip joint effusion is again noted. No muscle edema identified about the right hip or pelvis. Negative visible lower abdominal and pelvic viscera. IMPRESSION: 1. Positive for severe asymmetric right hip  osteoarthritis, with both low level degenerative appearing subchondral marrow edema about the joint, but also a suspicious area of bone edema at the lower right femoral neck. Suspect an impending and/or incomplete fracture of the lower neck. 2. Small but asymmetric right hip joint effusion. 3. Elsewhere the pelvis and left hip appear intact. Study discussed by telephone with Dr. Conni Slipper on 01/01/2020 at 23:44 . Electronically Signed   By: Genevie Ann M.D.   On: 01/01/2020 23:43   DG Foot Complete Right  Result Date: 01/01/2020 CLINICAL DATA:  Pain following fall EXAM: RIGHT FOOT COMPLETE - 3+ VIEW COMPARISON:  None. FINDINGS: Frontal, oblique, and lateral views were obtained. There is no acute fracture or dislocation. Remodeling of the calcaneus suggests prior trauma in this area. Bones are diffusely osteoporotic. There is calcification in the medial aspect of the first MTP joint. There is diffuse osteoarthritic change in the subtalar joints. Other joint spaces appear unremarkable. There is pes planus. IMPRESSION: Suspect residua of old trauma in the calcaneus. No acute fracture or dislocation. Pes planus. Calcification in the medial first MTP joint likely is due to calcium pyrophosphate deposition disease. Bones osteoporotic. Electronically Signed   By: Lowella Grip III M.D.   On: 01/01/2020 14:01   DG Femur Min 2 Views Right  Result Date: 01/01/2020 CLINICAL DATA:  Pain following fall EXAM: RIGHT FEMUR 2 VIEWS COMPARISON:  None. FINDINGS: Frontal and lateral views were obtained. No fracture or dislocation. No abnormal periosteal reaction. There is severe narrowing of the right hip joint. There is milder arthropathy in the knee region. IMPRESSION: No fracture or dislocation. Osteoarthritic change, most severe in the hip joint region. Electronically Signed   By: Lowella Grip III  M.D.   On: 01/01/2020 14:00   ASSESSMENT AND PLAN:  Chan Rosasco  is a 86 y.o. Caucasian female with a known history  of coronary artery disease, GERD, hypertension, stage IV chronic kidney disease and obstructive sleep apnea, who presented to the emergency room with acute onset of accidental mechanical fall on Friday.  She stumbled and got tripped up in her walker.  She was having right hip and right foot pain after falling   1. Nondisplaced right femoral neck fracture-- after fall and confirmed on MRI hip -Pain management will be provided. -Orthopedic consultation will be obtained.Dr. Mack Guise  -- Will start heparin drip per protocol to bridge perioperatively--d/w pharmacist -Surgery to be scheduled by Dr. Mack Guise -heparin will be managed by pharmacy  2.  Possible UTI that could be asymptomatic bacteriuria. -The patient was already given p.o. fosfomycin in the ER -Urine culture will be followed. -Patient asymptomatic. No fever. White count normal. Continue to monitor. No further antibiotics at present  3.  Hypertension. - continue her antihypertensives.  4.  Stage IV chronic kidney disease, stable. -Continue maintenance fluids -creatinine stable  5.  GERD. -PPI therapy will be resumed.  6.  Dyslipidemia. - will continue statin therapy.  7.  DVT prophylaxis. -SCDs. Heparin drip  8. History of DVT lower extremity -patient has been on eliquis for more than a year. She tells me her primary care physician Dr. Doy Hutching and cardiologist Dr. Ubaldo Glassing wanted her to be on it indefinitely -pharmacy consulted for heparin bridging preoperatively.  Procedures: Family communication : daughter in the room Consults : orthopedic CODE STATUS: full DVT Prophylaxis : heparin drip  Status is: Inpatient  Remains inpatient appropriate because:Ongoing active pain requiring inpatient pain management   Dispo: The patient is from: Home              Anticipated d/c is to: SNF likely              Anticipated d/c date is: 3 days              Patient currently is not medically stable to d/c. patient taking  eliquis. Now discontinued. Surgery right hip to be scheduled by Dr. Mack Guise. She will need PT OT consult after surgery TOC for discharge planning       TOTAL TIME TAKING CARE OF THIS PATIENT: *25* minutes.  >50% time spent on counselling and coordination of care  Note: This dictation was prepared with Dragon dictation along with smaller phrase technology. Any transcriptional errors that result from this process are unintentional.  Fritzi Mandes M.D    [facility]   CC: Primary care physician; Idelle Crouch, MDPatient ID: Eudelia Bunch, female   DOB: 1933/12/22, 86 y.o.   MRN: [id number]

## 2020-01-02 NOTE — Consult Note (Signed)
ANTICOAGULATION CONSULT NOTE - Initial Consult  Pharmacy Consult for Heparin drip Indication: VTE treatment  Allergies  Allergen Reactions  . Cefuroxime Axetil Diarrhea  . Codeine Nausea Only    Intolerance instead of true allerg  . Sulfa Antibiotics Swelling and Rash    swelling    Patient Measurements: Height: 5\' 3"  (160 cm) Weight: 81.6 kg (180 lb) IBW/kg (Calculated) : 52.4 Heparin Dosing Weight: 70.3kg  Vital Signs: Temp: 98.1 F (36.7 C) (08/12 0900) Temp Source: Oral (08/12 0900) BP: 144/70 (08/12 0900) Pulse Rate: 85 (08/12 0900)  Labs: Recent Labs    01/01/20 1344 01/02/20 0343  HGB 9.4* 9.6*  HCT 29.5* 28.9*  PLT 166 173  CREATININE 1.95* 1.74*    Estimated Creatinine Clearance: 24.4 mL/min (A) (by C-G formula based on SCr of 1.74 mg/dL (H)).   Medical History: Past Medical History:  Diagnosis Date  . Anemia    is followed by renal doctor and pcp  . Arthritis   . Breast cancer ([place]) 04/2016   Rt. breast ca, f/u with radiation  . Cancer [facility]) 1961   uterine cancer  . Coronary artery disease   . Dyspnea   . GERD (gastroesophageal reflux disease)   . Hypertension   . Kidney disease    stage 4.  followed by dr. Sedonia Small  . Lower extremity edema   . Personal history of radiation therapy   . S/P lumpectomy, right breast    having done on 04/26/16  . Sleep apnea    uses cpap    Medications:  Pt home dose of Eliquis (suspended pending hip surgery this Saturday 8/14) 2.5mg  bid - last taken 8/11 @ 1930  Assessment: 86 yo femalewith a known history of CAD, GERD, HTN, CKD4, OSA, and chronic DVT since 02/06/2019 who presented to the emergency room with acute onset of accidental mechanical fall on Friday. Pharmacy has been consulted to start a heparin drip while holding Eliquis prior to surgery.   Goal of Therapy:  aPTT 66-102 seconds Monitor platelets by anticoagulation protocol: Yes   Plan:  Will start heparin drip now without bolus (proximity  to last Eliquis dose) @ 1000 units/hr.  Will check baseline APTT, INR, and HL prior to starting drip.    Will manage heparin based on APTT until HL/APTT correlate.  APTT 8 hours after initiating heparin drip per protocol.  Lu Duffel, PharmD, BCPS Clinical Pharmacist 01/02/2020 1:09 PM

## 2020-01-02 NOTE — Consult Note (Signed)
ORTHOPAEDIC CONSULTATION  REQUESTING PHYSICIAN: Fritzi Mandes, MD  Chief Complaint: Right hip pain status post fall  HPI: Rachel Jones is a 86 y.o. female who complains of right hip pain status post fall at home.  Patient states that she was using her walker and her knees gave out causing her to fall.  Patient was unable to stand after this injury.  She was brought to the [facility] ER where x-rays, CT scan and MRI were performed.  The MRI suggests a nondisplaced fracture of the right femoral neck.  Patient has underlying right hip osteoarthritis.  Patient has been receiving corticosteroid injections by Dr. Candelaria Stagers at [place] [facility] for her right hip arthritis.  Patient denies any numbness or tingling currently but has had numbness and tingling in the past and in the morning she often states she has difficulty moving her legs.  Past Medical History:  Diagnosis Date  . Anemia    is followed by renal doctor and pcp  . Arthritis   . Breast cancer ([place]) 04/2016   Rt. breast ca, f/u with radiation  . Cancer [facility]) 1961   uterine cancer  . Coronary artery disease   . Dyspnea   . GERD (gastroesophageal reflux disease)   . Hypertension   . Kidney disease    stage 4.  followed by dr. Sedonia Small  . Lower extremity edema   . Personal history of radiation therapy   . S/P lumpectomy, right breast    having done on 04/26/16  . Sleep apnea    uses cpap   Past Surgical History:  Procedure Laterality Date  . BREAST BIOPSY Right 03/2016   invasive mammary carcinoma  . BREAST LUMPECTOMY Right    04/26/16, f/u with radiation  . CAROTID STENT Left 2016   was having pain down neck. carotid artery clogged. dr. Delana Meyer put stent in  . CAROTID STENT Left   . CATARACT EXTRACTION W/ INTRAOCULAR LENS  IMPLANT, BILATERAL Bilateral 2009  . EYE SURGERY  2009   cataracts  . PARTIAL HYSTERECTOMY  1961   uterus only removed  . PARTIAL MASTECTOMY WITH NEEDLE LOCALIZATION Right 04/26/2016    Procedure: PARTIAL MASTECTOMY WITH NEEDLE LOCALIZATION;  Surgeon: Leonie Green, MD;  Location: [facility] ORS;  Service: General;  Laterality: Right;  . SENTINEL NODE BIOPSY Right 04/26/2016   Procedure: SENTINEL NODE BIOPSY;  Surgeon: Leonie Green, MD;  Location: [facility] ORS;  Service: General;  Laterality: Right;   Social History   Socioeconomic History  . Marital status: Married    Spouse name: Not on file  . Number of children: Not on file  . Years of education: Not on file  . Highest education level: Not on file  Occupational History  . Not on file  Tobacco Use  . Smoking status: Never Smoker  . Smokeless tobacco: Never Used  Substance and Sexual Activity  . Alcohol use: No  . Drug use: No  . Sexual activity: Never  Other Topics Concern  . Not on file  Social History Narrative  . Not on file   Social Determinants of Health   Financial Resource Strain:   . Difficulty of Paying Living Expenses:   Food Insecurity:   . Worried About Charity fundraiser in the Last Year:   . Arboriculturist in the Last Year:   Transportation Needs:   . Film/video editor (Medical):   Marland Kitchen Lack of Transportation (Non-Medical):   Physical Activity:   .  Days of Exercise per Week:   . Minutes of Exercise per Session:   Stress:   . Feeling of Stress :   Social Connections:   . Frequency of Communication with Friends and Family:   . Frequency of Social Gatherings with Friends and Family:   . Attends Religious Services:   . Active Member of Clubs or Organizations:   . Attends Archivist Meetings:   Marland Kitchen Marital Status:    Family History  Problem Relation Age of Onset  . Bladder Cancer Neg Hx   . Kidney cancer Neg Hx   . Prolactinoma Neg Hx   . Prostate cancer Neg Hx    Allergies  Allergen Reactions  . Cefuroxime Axetil Diarrhea  . Codeine Nausea Only    Intolerance instead of true allerg  . Sulfa Antibiotics Swelling and Rash    swelling   Prior to Admission  medications   Medication Sig Start Date End Date Taking? Authorizing Provider  apixaban (ELIQUIS) 2.5 MG TABS tablet Take 1 tablet by mouth 2 (two) times a day. 12/09/18  Yes [provider]  calcium-vitamin D (OSCAL WITH D) 500-200 MG-UNIT tablet Take 1 tablet by mouth 2 (two) times daily. 09/01/16  Yes Lloyd Huger, MD  Cholecalciferol (VITAMIN D3) 1000 units CAPS Take 1,000 Units by mouth daily.    Yes [provider]  Cyanocobalamin (VITAMIN B-12 IJ) Inject 1,000 mcg as directed every 30 (thirty) days.   Yes [provider]  docusate sodium (STOOL SOFTENER) 100 MG capsule Take 100 mg by mouth 2 (two) times daily.   Yes [provider]  esomeprazole (NEXIUM) 40 MG capsule Take 40 mg by mouth daily.   Yes [provider]  ferrous gluconate (FERGON) 324 MG tablet Take 324 mg by mouth daily.   Yes [provider]  furosemide (LASIX) 20 MG tablet Take 20 mg by mouth daily as needed for edema.    Yes [provider]  lovastatin (MEVACOR) 40 MG tablet Take 40 mg by mouth 2 (two) times daily.   Yes [provider]  metoprolol tartrate (LOPRESSOR) 25 MG tablet Take 25 mg by mouth 2 (two) times daily. 11/20/19  Yes [provider]  Probiotic Product (PROBIOTIC ADVANCED) CAPS Take 1 capsule by mouth daily.    Yes [provider]  risedronate (ACTONEL) 35 MG tablet TAKE 1 TABLET EVERY 14 DAYS Patient taking differently: Take 35 mg by mouth every 14 (fourteen) days.  03/14/19  Yes Lloyd Huger, MD  tamoxifen (NOLVADEX) 20 MG tablet TAKE 1 TABLET (20 MG TOTAL) BY MOUTH DAILY. 07/05/19  Yes Lloyd Huger, MD  acetaminophen (TYLENOL) 500 MG tablet Take 1,000 mg by mouth daily. May take an additional 1000 mgs as needed for pain    [provider]   DG Pelvis 1-2 Views  Result Date: 01/01/2020 CLINICAL DATA:  Pain following fall EXAM: PELVIS - 1-2 VIEW COMPARISON:  None. FINDINGS: There is no  evidence of pelvic fracture or dislocation. There is marked narrowing of the right hip joint. Left hip joint appears essentially unremarkable. Sacroiliac joints appear unremarkable bilaterally. Bones osteoporotic. IMPRESSION: Severe osteoarthritic change in the right hip joint. Bones osteoporotic. No fracture or dislocation. Electronically Signed   By: Lowella Grip III M.D.   On: 01/01/2020 14:02   DG Tibia/Fibula Right  Result Date: 01/01/2020 CLINICAL DATA:  Pain following fall EXAM: RIGHT TIBIA AND FIBULA - 2 VIEW COMPARISON:  None. FINDINGS: Frontal and lateral views  were obtained. No appreciable fracture or dislocation. Bones are osteoporotic. No abnormal periosteal reaction. Mild generalized joint space narrowing. IMPRESSION: No fracture or dislocation. Bones osteoporotic. Knee and ankle joint osteoarthritic changes noted. Electronically Signed   By: Lowella Grip III M.D.   On: 01/01/2020 14:03   CT Head Wo Contrast  Result Date: 01/01/2020 CLINICAL DATA:  Status post fall 5 ago 12/27/2019 EXAM: CT HEAD WITHOUT CONTRAST TECHNIQUE: Contiguous axial images were obtained from the base of the skull through the vertex without intravenous contrast. COMPARISON:  08/05/2010 FINDINGS: Brain: No evidence of acute infarction, hemorrhage, extra-axial collection, ventriculomegaly, or mass effect. Generalized cerebral atrophy. Periventricular white matter low attenuation likely secondary to microangiopathy. Vascular: Cerebrovascular atherosclerotic calcifications are noted. Skull: Negative for fracture or focal lesion. Sinuses/Orbits: Visualized portions of the orbits are unremarkable. Visualized portions of the paranasal sinuses are unremarkable. Visualized portions of the mastoid air cells are unremarkable. Other: None. IMPRESSION: 1. No acute intracranial pathology. 2. Chronic microvascular disease and cerebral atrophy. Electronically Signed   By: Kathreen Devoid   On: 01/01/2020 13:39   CT Hip Right Wo  Contrast  Result Date: 01/01/2020 CLINICAL DATA:  Right hip and foot pain with shortening, rotation and swelling in the right foot after falling 5 days ago. EXAM: CT OF THE RIGHT HIP WITHOUT CONTRAST TECHNIQUE: Multidetector CT imaging of the right hip was performed according to the standard protocol. Multiplanar CT image reconstructions were also generated. COMPARISON:  Radiographs same date.  Pelvic CT 12/27/2013. FINDINGS: Bones/Joint/Cartilage There are advanced asymmetric osteoarthritic changes at the right hip which have progressed compared with the prior pelvic CT. There is advanced joint space narrowing with subchondral sclerosis, osteophytes and cyst formation, especially anteriorly. No evidence of acute fracture or dislocation. There is a small nonspecific right hip joint effusion with associated synovial thickening. No discrete intra-articular loose bodies are seen. Ligaments Suboptimally assessed by CT. Muscles and Tendons Mild gluteus muscular atrophy. No evidence of muscular hematoma or periarticular tendon tear. Soft tissues No evidence of periarticular hematoma or other fluid collection. Mild iliofemoral atherosclerosis. Probable pelvic floor laxity. IMPRESSION: 1. No evidence of acute fracture or dislocation. 2. Advanced asymmetric osteoarthritic changes at the right hip with small nonspecific right hip joint effusion and synovial thickening. 3. No focal periarticular hematoma. Electronically Signed   By: Richardean Sale M.D.   On: 01/01/2020 16:25   CT Foot Right Wo Contrast  Result Date: 01/01/2020 CLINICAL DATA:  Right foot pain since fall last Friday. EXAM: CT OF THE RIGHT FOOT WITHOUT CONTRAST TECHNIQUE: Multidetector CT imaging of the right foot was performed according to the standard protocol. Multiplanar CT image reconstructions were also generated. COMPARISON:  Right foot x-rays from same day. FINDINGS: Bones/Joint/Cartilage No acute fracture or dislocation. The ankle mortise is  symmetric. The talar dome is intact. Lisfranc alignment is maintained. Severe pes planus with bony cystic change in the lateral talar process and adjacent calcaneus. Mild subtalar and midfoot osteoarthritis. Mild hallux valgus deformity and first MTP joint osteoarthritis. Osteopenia. Small tibiotalar and posterior subtalar joint effusions. Scattered increased periarticular mineralization adjacent to the medial and lateral malleoli and first MTP and first IP joints. Ligaments Ligaments are suboptimally evaluated by CT. Muscles and Tendons Grossly intact.  Atrophy of the intrinsic foot muscles. Soft tissue Diffuse soft tissue swelling. No fluid collection or hematoma. No soft tissue mass. IMPRESSION: 1. No acute osseous abnormality. 2. Severe pes planus with evidence of lateral hindfoot impingement. Electronically Signed   By: Huntley Dec  Derry M.D.   On: 01/01/2020 16:36   MR HIP RIGHT WO CONTRAST  Result Date: 01/01/2020 CLINICAL DATA:  86 year old female with fall and unable to weightbear on the right hip. However, CT of the right hip earlier today negative for acute fracture or dislocation. EXAM: MR OF THE RIGHT HIP WITHOUT CONTRAST TECHNIQUE: Multiplanar, multisequence MR imaging was performed. No intravenous contrast was administered. COMPARISON:  CT right hip 1544 hours today. FINDINGS: T1 weighted and fluid sensitive sequences of the pelvis demonstrate normal background bone marrow signal. Sacrum appears intact. The left hip appears normal. Lower lumbar disc and endplate degeneration incidentally noted. Severe right hip osteoarthritis is redemonstrated with a 15 mm area of degenerative appearing marrow edema and/or fluid fluid at the anterior superior acetabulum (series 8, image 6 and series 5, image 22) that most resembles reactive changes around a degenerative subchondral cyst. But furthermore, there is conspicuous marrow edema forming a ring around the right lower femoral neck, junction with the  intertrochanteric segment (series 6, image 18 and series 9, image 18). And close scrutiny of this area on the earlier CT (series 3, images 38 and 39 of that exam) raises the possibility of a nondisplaced, incomplete fracture. However, on these coronal T1 weighted images of this study there is no obvious corresponding horizontal fracture plane. Superimposed small but asymmetric right hip joint effusion is again noted. No muscle edema identified about the right hip or pelvis. Negative visible lower abdominal and pelvic viscera. IMPRESSION: 1. Positive for severe asymmetric right hip osteoarthritis, with both low level degenerative appearing subchondral marrow edema about the joint, but also a suspicious area of bone edema at the lower right femoral neck. Suspect an impending and/or incomplete fracture of the lower neck. 2. Small but asymmetric right hip joint effusion. 3. Elsewhere the pelvis and left hip appear intact. Study discussed by telephone with Dr. Conni Slipper on 01/01/2020 at 23:44 . Electronically Signed   By: Genevie Ann M.D.   On: 01/01/2020 23:43   DG Foot Complete Right  Result Date: 01/01/2020 CLINICAL DATA:  Pain following fall EXAM: RIGHT FOOT COMPLETE - 3+ VIEW COMPARISON:  None. FINDINGS: Frontal, oblique, and lateral views were obtained. There is no acute fracture or dislocation. Remodeling of the calcaneus suggests prior trauma in this area. Bones are diffusely osteoporotic. There is calcification in the medial aspect of the first MTP joint. There is diffuse osteoarthritic change in the subtalar joints. Other joint spaces appear unremarkable. There is pes planus. IMPRESSION: Suspect residua of old trauma in the calcaneus. No acute fracture or dislocation. Pes planus. Calcification in the medial first MTP joint likely is due to calcium pyrophosphate deposition disease. Bones osteoporotic. Electronically Signed   By: Lowella Grip III M.D.   On: 01/01/2020 14:01   DG Femur Min 2 Views  Right  Result Date: 01/01/2020 CLINICAL DATA:  Pain following fall EXAM: RIGHT FEMUR 2 VIEWS COMPARISON:  None. FINDINGS: Frontal and lateral views were obtained. No fracture or dislocation. No abnormal periosteal reaction. There is severe narrowing of the right hip joint. There is milder arthropathy in the knee region. IMPRESSION: No fracture or dislocation. Osteoarthritic change, most severe in the hip joint region. Electronically Signed   By: Lowella Grip III M.D.   On: 01/01/2020 14:00    Positive ROS: All other systems have been reviewed and were otherwise negative with the exception of those mentioned in the HPI and as above.  Physical Exam: General: Alert, no acute distress.  Patient had bruising over the right lateral arm but has no pain with active movement today.  MUSCULOSKELETAL: Right lower extremity: Patient skin is intact.  There is a small area of ecchymosis over the lateral right hip.  Patient has palpable pedal pulses, intact sensation light touch and intact motor function distally.  There is no significant shortening or external rotation.  Assessment: Nondisplaced right femoral neck hip fracture in the setting of osteoarthritis of the right hip  Plan: I discussed the fracture with the patient and her daughter in her hospital room today.  I am recommending percutaneous fixation with cannulated screws.  Patient is on Eliquis and will need to wait 48 hours before surgery can be performed.  I reviewed the details of the operation as well as the postoperative course.  I discussed the risks and benefits of surgery. The risks include but are not limited to infection, bleeding, nerve or blood vessel injury, joint stiffness or loss of motion, persistent pain, weakness or instability, malunion, nonunion and hardware failure and the need for further surgery including conversion to a right total hip arthroplasty. Medical risks include but are not limited to DVT and pulmonary embolism,  myocardial infarction, stroke, pneumonia, respiratory failure and death.   The patient and her daughter were in agreement with the plan for percutaneous fixation versus a total hip arthroplasty to treat her fracture today.  Patient states she has been wanting to avoid total hip arthroplasty surgery and therefore is receiving corticosteroid injections.  The patient and her daughter understood the surgical risks and wished to proceed.   Patient will be n.p.o. after midnight on Friday evening.  Eliquis needs to be held until after surgery.  I will discuss anticoagulation prior to surgery with Dr. Posey Pronto the patient's hospitalist.    Thornton Park, MD    01/02/2020 12:00 PM

## 2020-01-02 NOTE — Plan of Care (Signed)
  Problem: Health Behavior/Discharge Planning: Goal: Ability to manage health-related needs will improve Outcome: Progressing   

## 2020-01-03 DIAGNOSIS — Z7901 Long term (current) use of anticoagulants: Secondary | ICD-10-CM | POA: Diagnosis not present

## 2020-01-03 DIAGNOSIS — N184 Chronic kidney disease, stage 4 (severe): Secondary | ICD-10-CM | POA: Diagnosis not present

## 2020-01-03 DIAGNOSIS — I10 Essential (primary) hypertension: Secondary | ICD-10-CM | POA: Diagnosis not present

## 2020-01-03 DIAGNOSIS — S72001A Fracture of unspecified part of neck of right femur, initial encounter for closed fracture: Secondary | ICD-10-CM | POA: Diagnosis not present

## 2020-01-03 LAB — APTT
aPTT: 103 seconds — ABNORMAL HIGH (ref 24–36)
aPTT: 69 seconds — ABNORMAL HIGH (ref 24–36)

## 2020-01-03 LAB — CBC
HCT: 27 % — ABNORMAL LOW (ref 36.0–46.0)
Hemoglobin: 8.6 g/dL — ABNORMAL LOW (ref 12.0–15.0)
MCH: 31.5 pg (ref 26.0–34.0)
MCHC: 31.9 g/dL (ref 30.0–36.0)
MCV: 98.9 fL (ref 80.0–100.0)
Platelets: 151 10*3/uL (ref 150–400)
RBC: 2.73 MIL/uL — ABNORMAL LOW (ref 3.87–5.11)
RDW: 12.4 % (ref 11.5–15.5)
WBC: 7.1 10*3/uL (ref 4.0–10.5)
nRBC: 0 % (ref 0.0–0.2)

## 2020-01-03 LAB — URINE CULTURE: Culture: >=100000 — AB

## 2020-01-03 LAB — HEPARIN LEVEL (UNFRACTIONATED): Heparin Unfractionated: 0.84 IU/mL — ABNORMAL HIGH (ref 0.30–0.70)

## 2020-01-03 MED ORDER — CEFAZOLIN SODIUM-DEXTROSE 2-4 GM/100ML-% IV SOLN
2.0000 g | INTRAVENOUS | Status: AC
  Administered 2020-01-04: 2 g via INTRAVENOUS
  Filled 2020-01-03 (×2): qty 100

## 2020-01-03 MED ORDER — HEPARIN (PORCINE) 25000 UT/250ML-% IV SOLN
800.0000 [IU]/h | INTRAVENOUS | Status: AC
  Administered 2020-01-03: 800 [IU]/h via INTRAVENOUS
  Filled 2020-01-03: qty 250

## 2020-01-03 MED ORDER — METHOCARBAMOL 500 MG PO TABS
500.0000 mg | ORAL_TABLET | Freq: Three times a day (TID) | ORAL | Status: DC | PRN
  Administered 2020-01-03 – 2020-01-06 (×6): 500 mg via ORAL
  Filled 2020-01-03 (×6): qty 1

## 2020-01-03 NOTE — Progress Notes (Signed)
Subjective:  Patient planing of right hip pain.  Pain is improved after Robaxin administered for muscle spasms.  Patient is seen with a family member at the bedside.  Objective:   VITALS:   Vitals:   01/02/20 1446 01/02/20 2322 01/03/20 0820 01/03/20 1601  BP: 118/65 (!) 146/59 127/61 137/61  Pulse: 77 79 76 87  Resp: 16 17 18 19   Temp: 98.5 F (36.9 C) 99 F (37.2 C) 98.8 F (37.1 C) 99.6 F (37.6 C)  TempSrc: Oral Oral Oral Oral  SpO2: 100% 97% 100% 96%  Weight:      Height:        PHYSICAL EXAM: Right lower extremity: Neurovascular intact Sensation intact distally Intact pulses distally Dorsiflexion/Plantar flexion intact No cellulitis present Compartment soft  LABS  Results for orders placed or performed during the hospital encounter of 01/01/20 (from the past 24 hour(s))  APTT     Status: Abnormal   Collection Time: 01/02/20 10:33 PM  Result Value Ref Range   aPTT 134 (H) 24 - 36 seconds  CBC     Status: Abnormal   Collection Time: 01/03/20  6:07 AM  Result Value Ref Range   WBC 7.1 4.0 - 10.5 K/uL   RBC 2.73 (L) 3.87 - 5.11 MIL/uL   Hemoglobin 8.6 (L) 12.0 - 15.0 g/dL   HCT 27.0 (L) 36 - 46 %   MCV 98.9 80.0 - 100.0 fL   MCH 31.5 26.0 - 34.0 pg   MCHC 31.9 30.0 - 36.0 g/dL   RDW 12.4 11.5 - 15.5 %   Platelets 151 150 - 400 K/uL   nRBC 0.0 0.0 - 0.2 %  APTT     Status: Abnormal   Collection Time: 01/03/20  6:07 AM  Result Value Ref Range   aPTT 103 (H) 24 - 36 seconds  APTT     Status: Abnormal   Collection Time: 01/03/20  3:40 PM  Result Value Ref Range   aPTT 69 (H) 24 - 36 seconds  Heparin level (unfractionated)     Status: Abnormal   Collection Time: 01/03/20  3:40 PM  Result Value Ref Range   Heparin Unfractionated 0.84 (H) 0.30 - 0.70 IU/mL    MR HIP RIGHT WO CONTRAST  Result Date: 01/01/2020 CLINICAL DATA:  86 year old female with fall and unable to weightbear on the right hip. However, CT of the right hip earlier today negative for  acute fracture or dislocation. EXAM: MR OF THE RIGHT HIP WITHOUT CONTRAST TECHNIQUE: Multiplanar, multisequence MR imaging was performed. No intravenous contrast was administered. COMPARISON:  CT right hip 1544 hours today. FINDINGS: T1 weighted and fluid sensitive sequences of the pelvis demonstrate normal background bone marrow signal. Sacrum appears intact. The left hip appears normal. Lower lumbar disc and endplate degeneration incidentally noted. Severe right hip osteoarthritis is redemonstrated with a 15 mm area of degenerative appearing marrow edema and/or fluid fluid at the anterior superior acetabulum (series 8, image 6 and series 5, image 22) that most resembles reactive changes around a degenerative subchondral cyst. But furthermore, there is conspicuous marrow edema forming a ring around the right lower femoral neck, junction with the intertrochanteric segment (series 6, image 18 and series 9, image 18). And close scrutiny of this area on the earlier CT (series 3, images 38 and 39 of that exam) raises the possibility of a nondisplaced, incomplete fracture. However, on these coronal T1 weighted images of this study there is no obvious corresponding horizontal fracture plane. Superimposed small  but asymmetric right hip joint effusion is again noted. No muscle edema identified about the right hip or pelvis. Negative visible lower abdominal and pelvic viscera. IMPRESSION: 1. Positive for severe asymmetric right hip osteoarthritis, with both low level degenerative appearing subchondral marrow edema about the joint, but also a suspicious area of bone edema at the lower right femoral neck. Suspect an impending and/or incomplete fracture of the lower neck. 2. Small but asymmetric right hip joint effusion. 3. Elsewhere the pelvis and left hip appear intact. Study discussed by telephone with Dr. Conni Slipper on 01/01/2020 at 23:44 . Electronically Signed   By: Genevie Ann M.D.   On: 01/01/2020 23:43     Assessment/Plan:     Active Problems:   CKD (chronic kidney disease), stage IV (HCC)   Closed right hip fracture (HCC)   Chronic anticoagulation  Patient surgery is scheduled for 8 AM in the morning.  She will undergo percutaneous fixation of the right femoral neck hip fracture.  Patient will be n.p.o. after midnight.  Her heparin will need to stop at 1 AM in preparation for surgery first thing in the morning.  I described the details of the operation as well as the postoperative course with the patient.  She is in agreement with the operative plan.    Thornton Park , MD 01/03/2020, 5:41 PM

## 2020-01-03 NOTE — Progress Notes (Signed)
[facility] NAME: Rachel Jones    MR#:  [id number]  DATE OF BIRTH:  [birth year]  SUBJECTIVE:  patient came in after Uneventful day. No issues per RN. Patient doing overall okay. Currently on heparin drip REVIEW OF SYSTEMS:   Review of Systems  Constitutional: Negative for chills, fever and weight loss.  HENT: Negative for ear discharge, ear pain and nosebleeds.   Eyes: Negative for blurred vision, pain and discharge.  Respiratory: Negative for sputum production, shortness of breath, wheezing and stridor.   Cardiovascular: Negative for chest pain, palpitations, orthopnea and PND.  Gastrointestinal: Negative for abdominal pain, diarrhea, nausea and vomiting.  Genitourinary: Negative for frequency and urgency.  Musculoskeletal: Positive for falls and joint pain. Negative for back pain.  Neurological: Positive for weakness. Negative for sensory change, speech change and focal weakness.  Psychiatric/Behavioral: Negative for depression and hallucinations. The patient is not nervous/anxious.    Tolerating Diet:yes Tolerating PT: pending surgery  DRUG ALLERGIES:   Allergies  Allergen Reactions  . Cefuroxime Axetil Diarrhea  . Codeine Nausea Only    Intolerance instead of true allerg  . Sulfa Antibiotics Swelling and Rash    swelling    VITALS:  Blood pressure 127/61, pulse 76, temperature 98.8 F (37.1 C), temperature source Oral, resp. rate 18, height 5\' 3"  (1.6 m), weight 81.6 kg, SpO2 100 %.  PHYSICAL EXAMINATION:   Physical Exam  GENERAL:  86 y.o.-year-old patient lying in the bed with no acute distress.  EYES: Pupils equal, round, reactive to light and accommodation. No scleral icterus.   HEENT: Head atraumatic, normocephalic. Oropharynx and nasopharynx clear.  NECK:  Supple, no jugular venous distention. No thyroid enlargement, no tenderness.  LUNGS: Normal breath sounds bilaterally, no wheezing, rales, rhonchi. No use  of accessory muscles of respiration.  CARDIOVASCULAR: S1, S2 normal. No murmurs, rubs, or gallops.  ABDOMEN: Soft, nontender, nondistended. Bowel sounds present. No organomegaly or mass.  EXTREMITIES: No cyanosis, clubbing or edema b/l.   Right lower extremity restricted range of motion. Pain present. NEUROLOGIC: Cranial nerves II through XII are intact. No focal Motor or sensory deficits b/l.   PSYCHIATRIC:  patient is alert and oriented x 3.  SKIN: No obvious rash, lesion, or ulcer.   LABORATORY PANEL:  CBC Recent Labs  Lab 01/03/20 0607  WBC 7.1  HGB 8.6*  HCT 27.0*  PLT 151    Chemistries  Recent Labs  Lab 01/02/20 0343  NA 141  K 4.7  CL 106  CO2 25  GLUCOSE 92  BUN 27*  CREATININE 1.74*  CALCIUM 8.2*   Cardiac Enzymes No results for input(s): TROPONINI in the last 168 hours. RADIOLOGY:  DG Pelvis 1-2 Views  Result Date: 01/01/2020 CLINICAL DATA:  Pain following fall EXAM: PELVIS - 1-2 VIEW COMPARISON:  None. FINDINGS: There is no evidence of pelvic fracture or dislocation. There is marked narrowing of the right hip joint. Left hip joint appears essentially unremarkable. Sacroiliac joints appear unremarkable bilaterally. Bones osteoporotic. IMPRESSION: Severe osteoarthritic change in the right hip joint. Bones osteoporotic. No fracture or dislocation. Electronically Signed   By: Lowella Grip III M.D.   On: 01/01/2020 14:02   DG Tibia/Fibula Right  Result Date: 01/01/2020 CLINICAL DATA:  Pain following fall EXAM: RIGHT TIBIA AND FIBULA - 2 VIEW COMPARISON:  None. FINDINGS: Frontal and lateral views were obtained. No appreciable fracture or dislocation. Bones are osteoporotic. No abnormal periosteal reaction. Mild generalized joint space  narrowing. IMPRESSION: No fracture or dislocation. Bones osteoporotic. Knee and ankle joint osteoarthritic changes noted. Electronically Signed   By: Lowella Grip III M.D.   On: 01/01/2020 14:03   CT Head Wo Contrast  Result  Date: 01/01/2020 CLINICAL DATA:  Status post fall 5 ago 12/27/2019 EXAM: CT HEAD WITHOUT CONTRAST TECHNIQUE: Contiguous axial images were obtained from the base of the skull through the vertex without intravenous contrast. COMPARISON:  08/05/2010 FINDINGS: Brain: No evidence of acute infarction, hemorrhage, extra-axial collection, ventriculomegaly, or mass effect. Generalized cerebral atrophy. Periventricular white matter low attenuation likely secondary to microangiopathy. Vascular: Cerebrovascular atherosclerotic calcifications are noted. Skull: Negative for fracture or focal lesion. Sinuses/Orbits: Visualized portions of the orbits are unremarkable. Visualized portions of the paranasal sinuses are unremarkable. Visualized portions of the mastoid air cells are unremarkable. Other: None. IMPRESSION: 1. No acute intracranial pathology. 2. Chronic microvascular disease and cerebral atrophy. Electronically Signed   By: Kathreen Devoid   On: 01/01/2020 13:39   CT Hip Right Wo Contrast  Result Date: 01/01/2020 CLINICAL DATA:  Right hip and foot pain with shortening, rotation and swelling in the right foot after falling 5 days ago. EXAM: CT OF THE RIGHT HIP WITHOUT CONTRAST TECHNIQUE: Multidetector CT imaging of the right hip was performed according to the standard protocol. Multiplanar CT image reconstructions were also generated. COMPARISON:  Radiographs same date.  Pelvic CT 12/27/2013. FINDINGS: Bones/Joint/Cartilage There are advanced asymmetric osteoarthritic changes at the right hip which have progressed compared with the prior pelvic CT. There is advanced joint space narrowing with subchondral sclerosis, osteophytes and cyst formation, especially anteriorly. No evidence of acute fracture or dislocation. There is a small nonspecific right hip joint effusion with associated synovial thickening. No discrete intra-articular loose bodies are seen. Ligaments Suboptimally assessed by CT. Muscles and Tendons Mild  gluteus muscular atrophy. No evidence of muscular hematoma or periarticular tendon tear. Soft tissues No evidence of periarticular hematoma or other fluid collection. Mild iliofemoral atherosclerosis. Probable pelvic floor laxity. IMPRESSION: 1. No evidence of acute fracture or dislocation. 2. Advanced asymmetric osteoarthritic changes at the right hip with small nonspecific right hip joint effusion and synovial thickening. 3. No focal periarticular hematoma. Electronically Signed   By: Richardean Sale M.D.   On: 01/01/2020 16:25   CT Foot Right Wo Contrast  Result Date: 01/01/2020 CLINICAL DATA:  Right foot pain since fall last Friday. EXAM: CT OF THE RIGHT FOOT WITHOUT CONTRAST TECHNIQUE: Multidetector CT imaging of the right foot was performed according to the standard protocol. Multiplanar CT image reconstructions were also generated. COMPARISON:  Right foot x-rays from same day. FINDINGS: Bones/Joint/Cartilage No acute fracture or dislocation. The ankle mortise is symmetric. The talar dome is intact. Lisfranc alignment is maintained. Severe pes planus with bony cystic change in the lateral talar process and adjacent calcaneus. Mild subtalar and midfoot osteoarthritis. Mild hallux valgus deformity and first MTP joint osteoarthritis. Osteopenia. Small tibiotalar and posterior subtalar joint effusions. Scattered increased periarticular mineralization adjacent to the medial and lateral malleoli and first MTP and first IP joints. Ligaments Ligaments are suboptimally evaluated by CT. Muscles and Tendons Grossly intact.  Atrophy of the intrinsic foot muscles. Soft tissue Diffuse soft tissue swelling. No fluid collection or hematoma. No soft tissue mass. IMPRESSION: 1. No acute osseous abnormality. 2. Severe pes planus with evidence of lateral hindfoot impingement. Electronically Signed   By: Titus Dubin M.D.   On: 01/01/2020 16:36   MR HIP RIGHT WO CONTRAST  Result Date: 01/01/2020  CLINICAL DATA:   86 year old female with fall and unable to weightbear on the right hip. However, CT of the right hip earlier today negative for acute fracture or dislocation. EXAM: MR OF THE RIGHT HIP WITHOUT CONTRAST TECHNIQUE: Multiplanar, multisequence MR imaging was performed. No intravenous contrast was administered. COMPARISON:  CT right hip 1544 hours today. FINDINGS: T1 weighted and fluid sensitive sequences of the pelvis demonstrate normal background bone marrow signal. Sacrum appears intact. The left hip appears normal. Lower lumbar disc and endplate degeneration incidentally noted. Severe right hip osteoarthritis is redemonstrated with a 15 mm area of degenerative appearing marrow edema and/or fluid fluid at the anterior superior acetabulum (series 8, image 6 and series 5, image 22) that most resembles reactive changes around a degenerative subchondral cyst. But furthermore, there is conspicuous marrow edema forming a ring around the right lower femoral neck, junction with the intertrochanteric segment (series 6, image 18 and series 9, image 18). And close scrutiny of this area on the earlier CT (series 3, images 38 and 39 of that exam) raises the possibility of a nondisplaced, incomplete fracture. However, on these coronal T1 weighted images of this study there is no obvious corresponding horizontal fracture plane. Superimposed small but asymmetric right hip joint effusion is again noted. No muscle edema identified about the right hip or pelvis. Negative visible lower abdominal and pelvic viscera. IMPRESSION: 1. Positive for severe asymmetric right hip osteoarthritis, with both low level degenerative appearing subchondral marrow edema about the joint, but also a suspicious area of bone edema at the lower right femoral neck. Suspect an impending and/or incomplete fracture of the lower neck. 2. Small but asymmetric right hip joint effusion. 3. Elsewhere the pelvis and left hip appear intact. Study discussed by telephone  with Dr. Conni Slipper on 01/01/2020 at 23:44 . Electronically Signed   By: Genevie Ann M.D.   On: 01/01/2020 23:43   DG Foot Complete Right  Result Date: 01/01/2020 CLINICAL DATA:  Pain following fall EXAM: RIGHT FOOT COMPLETE - 3+ VIEW COMPARISON:  None. FINDINGS: Frontal, oblique, and lateral views were obtained. There is no acute fracture or dislocation. Remodeling of the calcaneus suggests prior trauma in this area. Bones are diffusely osteoporotic. There is calcification in the medial aspect of the first MTP joint. There is diffuse osteoarthritic change in the subtalar joints. Other joint spaces appear unremarkable. There is pes planus. IMPRESSION: Suspect residua of old trauma in the calcaneus. No acute fracture or dislocation. Pes planus. Calcification in the medial first MTP joint likely is due to calcium pyrophosphate deposition disease. Bones osteoporotic. Electronically Signed   By: Lowella Grip III M.D.   On: 01/01/2020 14:01   DG Femur Min 2 Views Right  Result Date: 01/01/2020 CLINICAL DATA:  Pain following fall EXAM: RIGHT FEMUR 2 VIEWS COMPARISON:  None. FINDINGS: Frontal and lateral views were obtained. No fracture or dislocation. No abnormal periosteal reaction. There is severe narrowing of the right hip joint. There is milder arthropathy in the knee region. IMPRESSION: No fracture or dislocation. Osteoarthritic change, most severe in the hip joint region. Electronically Signed   By: Lowella Grip III M.D.   On: 01/01/2020 14:00   ASSESSMENT AND PLAN:  Rachel Jones  is a 86 y.o. Caucasian female with a known history of coronary artery disease, GERD, hypertension, stage IV chronic kidney disease and obstructive sleep apnea, who presented to the emergency room with acute onset of accidental mechanical fall on Friday.  She stumbled and got  tripped up in her walker.  She was having right hip and right foot pain after falling   1. Nondisplaced right femoral neck fracture-- after fall  and confirmed on MRI hip -Pain management will be provided. -Orthopedic consultation  With Dr. Mack Guise  --cont IV heparin drip per protocol to bridge perioperatively--d/w pharmacist -Surgery to be scheduled by Dr. Mack Guise -heparin will be managed by pharmacy  2.  Possible UTI that is likely asymptomatic bacteriuria. -- patient was already given p.o. fosfomycin in the ER -Urine culture will be followed. -Patient asymptomatic. No fever. White count normal. Continue to monitor. No further antibiotics at present  3.  Hypertension. - continue her antihypertensives.  4.  Stage IV chronic kidney disease, stable. -creatinine stable  5.  GERD. -PPI therapy will be resumed.  6.  Dyslipidemia. - continue statin therapy.  7.  DVT prophylaxis. -SCDs. Heparin drip  8. History of DVT lower extremity -patient has been on eliquis for more than a year. She tells me her primary care physician Dr. Doy Hutching and cardiologist Dr. Ubaldo Glassing wanted her to be on it indefinitely -pharmacy consulted for heparin bridging preoperatively.  Procedures: Family communication : none today Consults : orthopedic CODE STATUS: full DVT Prophylaxis : heparin drip  Status is: Inpatient  Remains inpatient appropriate because:Ongoing active pain requiring inpatient pain management   Dispo: The patient is from: Home              Anticipated d/c is to: SNF likely              Anticipated d/c date is: 3 days              Patient currently is not medically stable to d/c. patient taking eliquis. Now on hold Surgery right hip to be scheduled by Dr. Mack Guise. She will need PT OT consult after surgery TOC for discharge planning       TOTAL TIME TAKING CARE OF THIS PATIENT: *24* minutes.  >50% time spent on counselling and coordination of care  Note: This dictation was prepared with Dragon dictation along with smaller phrase technology. Any transcriptional errors that result from this process are  unintentional.  Fritzi Mandes M.D    [facility]   CC: Primary care physician; Idelle Crouch, MDPatient ID: Rachel Jones, female   DOB: [birth year], 86 y.o.   MRN: [id number]

## 2020-01-03 NOTE — Consult Note (Addendum)
ANTICOAGULATION CONSULT NOTE - Initial Consult  Pharmacy Consult for Heparin drip Indication: VTE treatment  Allergies  Allergen Reactions  . Cefuroxime Axetil Diarrhea  . Codeine Nausea Only    Intolerance instead of true allerg  . Sulfa Antibiotics Swelling and Rash    swelling    Patient Measurements: Height: 5\' 3"  (160 cm) Weight: 81.6 kg (180 lb) IBW/kg (Calculated) : 52.4 Heparin Dosing Weight: 70.3kg  Vital Signs: Temp: 99 F (37.2 C) (08/12 2322) Temp Source: Oral (08/12 2322) BP: 146/59 (08/12 2322) Pulse Rate: 79 (08/12 2322)  Labs: Recent Labs    01/01/20 1344 01/02/20 0343 01/02/20 1324 01/02/20 2233  HGB 9.4* 9.6*  --   --   HCT 29.5* 28.9*  --   --   PLT 166 173  --   --   APTT  --   --  41* 134*  LABPROT  --   --  14.8  --   INR  --   --  1.2  --   HEPARINUNFRC  --   --  1.70*  --   CREATININE 1.95* 1.74*  --   --     Estimated Creatinine Clearance: 24.4 mL/min (A) (by C-G formula based on SCr of 1.74 mg/dL (H)).   Medical History: Past Medical History:  Diagnosis Date  . Anemia    is followed by renal doctor and pcp  . Arthritis   . Breast cancer ([place]) 04/2016   Rt. breast ca, f/u with radiation  . Cancer [facility]) 1961   uterine cancer  . Coronary artery disease   . Dyspnea   . GERD (gastroesophageal reflux disease)   . Hypertension   . Kidney disease    stage 4.  followed by dr. Sedonia Small  . Lower extremity edema   . Personal history of radiation therapy   . S/P lumpectomy, right breast    having done on 04/26/16  . Sleep apnea    uses cpap    Medications:  Pt home dose of Eliquis (suspended pending hip surgery this Saturday 8/14) 2.5mg  bid - last taken 8/11 @ 1930  Assessment: 86 yo femalewith a known history of CAD, GERD, HTN, CKD4, OSA, and chronic DVT since 02/06/2019 who presented to the emergency room with acute onset of accidental mechanical fall on Friday. Pharmacy has been consulted to start a heparin drip while holding  Eliquis prior to surgery.   Goal of Therapy:  aPTT 66-102 seconds Monitor platelets by anticoagulation protocol: Yes   Plan:  08/12 @ 2230 aPTT 134 seconds supratherapeutic. Will decrease rate to 800 units/hr and will recheck aPTT at 0600 and continue to monitor.  Tobie Lords, PharmD, BCPS Clinical Pharmacist 01/03/2020 12:00 AM

## 2020-01-03 NOTE — Progress Notes (Signed)
PT Cancellation Note  Patient Details Name: Rachel Jones MRN: [id number] DOB: 05-Sep-1933   Cancelled Treatment:    Reason Eval/Treat Not Completed: Medical issues which prohibited therapy (Consult received and chart reviewed. Patient noted with acute hip fracture, pending surgical repair next date.  Mobility contraindicated at this time. Will complete initial order; please re-consult post-op as medically appropriate.)  Senay Sistrunk H. Owens Shark, PT, DPT, NCS 01/03/20, 8:14 AM [phone]

## 2020-01-03 NOTE — Consult Note (Signed)
ANTICOAGULATION CONSULT NOTE - Initial Consult  Pharmacy Consult for Heparin drip Indication: VTE treatment  Allergies  Allergen Reactions  . Cefuroxime Axetil Diarrhea  . Codeine Nausea Only    Intolerance instead of true allerg  . Sulfa Antibiotics Swelling and Rash    swelling    Patient Measurements: Height: 5\' 3"  (160 cm) Weight: 81.6 kg (180 lb) IBW/kg (Calculated) : 52.4 Heparin Dosing Weight: 70.3kg  Vital Signs: Temp: 99 F (37.2 C) (08/12 2322) Temp Source: Oral (08/12 2322) BP: 146/59 (08/12 2322) Pulse Rate: 79 (08/12 2322)  Labs: Recent Labs    01/01/20 1344 01/01/20 1344 01/02/20 0343 01/02/20 1324 01/02/20 2233 01/03/20 0607  HGB 9.4*   < > 9.6*  --   --  8.6*  HCT 29.5*  --  28.9*  --   --  27.0*  PLT 166  --  173  --   --  151  APTT  --   --   --  41* 134* 103*  LABPROT  --   --   --  14.8  --   --   INR  --   --   --  1.2  --   --   HEPARINUNFRC  --   --   --  1.70*  --   --   CREATININE 1.95*  --  1.74*  --   --   --    < > = values in this interval not displayed.    Estimated Creatinine Clearance: 24.4 mL/min (A) (by C-G formula based on SCr of 1.74 mg/dL (H)).   Medical History: Past Medical History:  Diagnosis Date  . Anemia    is followed by renal doctor and pcp  . Arthritis   . Breast cancer ([place]) 04/2016   Rt. breast ca, f/u with radiation  . Cancer [facility]) 1961   uterine cancer  . Coronary artery disease   . Dyspnea   . GERD (gastroesophageal reflux disease)   . Hypertension   . Kidney disease    stage 4.  followed by dr. Sedonia Small  . Lower extremity edema   . Personal history of radiation therapy   . S/P lumpectomy, right breast    having done on 04/26/16  . Sleep apnea    uses cpap    Medications:  Pt home dose of Eliquis (suspended pending hip surgery this Saturday 8/14) 2.5mg  bid - last taken 8/11 @ 1930  Assessment: 86 yo femalewith a known history of CAD, GERD, HTN, CKD4, OSA, and chronic DVT since 02/06/2019 who  presented to the emergency room with acute onset of accidental mechanical fall on Friday. Pharmacy has been consulted to start a heparin drip while holding Eliquis prior to surgery.   Goal of Therapy:  aPTT 66-102 seconds Monitor platelets by anticoagulation protocol: Yes   Plan:  08/13 @ 0600 aPTT 103 seconds slightly supratherapeutic. Will decrease rate to 750 units/hr and will recheck aPTT at 1300 and continue to monitor.  Tobie Lords, PharmD, BCPS Clinical Pharmacist 01/03/2020 7:11 AM

## 2020-01-03 NOTE — Consult Note (Signed)
ANTICOAGULATION CONSULT NOTE - Follow up [place] for Heparin drip Indication: VTE treatment  Allergies  Allergen Reactions  . Cefuroxime Axetil Diarrhea  . Codeine Nausea Only    Intolerance instead of true allerg  . Sulfa Antibiotics Swelling and Rash    swelling    Patient Measurements: Height: 5\' 3"  (160 cm) Weight: 81.6 kg (180 lb) IBW/kg (Calculated) : 52.4 Heparin Dosing Weight: 70.3kg  Vital Signs: Temp: 98.8 F (37.1 C) (08/13 0820) Temp Source: Oral (08/13 0820) BP: 127/61 (08/13 0820) Pulse Rate: 76 (08/13 0820)  Labs: Recent Labs    01/01/20 1344 01/01/20 1344 01/02/20 0343 01/02/20 1324 01/02/20 2233 01/03/20 0607  HGB 9.4*   < > 9.6*  --   --  8.6*  HCT 29.5*  --  28.9*  --   --  27.0*  PLT 166  --  173  --   --  151  APTT  --   --   --  41* 134* 103*  LABPROT  --   --   --  14.8  --   --   INR  --   --   --  1.2  --   --   HEPARINUNFRC  --   --   --  1.70*  --   --   CREATININE 1.95*  --  1.74*  --   --   --    < > = values in this interval not displayed.    Estimated Creatinine Clearance: 24.4 mL/min (A) (by C-G formula based on SCr of 1.74 mg/dL (H)).   Medical History: Past Medical History:  Diagnosis Date  . Anemia    is followed by renal doctor and pcp  . Arthritis   . Breast cancer ([place]) 04/2016   Rt. breast ca, f/u with radiation  . Cancer [facility]) 1961   uterine cancer  . Coronary artery disease   . Dyspnea   . GERD (gastroesophageal reflux disease)   . Hypertension   . Kidney disease    stage 4.  followed by dr. Sedonia Small  . Lower extremity edema   . Personal history of radiation therapy   . S/P lumpectomy, right breast    having done on 04/26/16  . Sleep apnea    uses cpap    Medications:  Pt home dose of Eliquis (suspended pending hip surgery this Saturday 8/14) 2.5mg  bid - last taken 8/11 @ 1930  Assessment: 86 yo femalewith a known history of CAD, GERD, HTN, CKD4, OSA, and chronic DVT since 02/06/2019  who presented to the emergency room with acute onset of accidental mechanical fall on Friday. Pharmacy has been consulted to start a heparin drip while holding Eliquis prior to surgery.   Goal of Therapy:  aPTT 66-102 seconds Monitor platelets by anticoagulation protocol: Yes   Plan:  08/13 @ 0600 aPTT 103 seconds slightly supratherapeutic. Will decrease rate to 750 units/hr and will recheck aPTT and daily HL at 1600 and continue to monitor. HL and CBC in AM Per RN at shift change, no s/sx of bleeding, vital signs stable.   Pharmacy will continue to follow.   Rocky Morel, PharmD, BCPS Clinical Pharmacist 01/03/2020 8:31 AM

## 2020-01-03 NOTE — Anesthesia Preprocedure Evaluation (Addendum)
Anesthesia Evaluation  Patient identified by MRN, date of birth, ID band Patient awake    Reviewed: Allergy & Precautions, H&P , NPO status , reviewed documented beta blocker date and time   Airway Mallampati: III  TM Distance: >3 FB Neck ROM: full    Dental  (+) Upper Dentures, Lower Dentures   Pulmonary shortness of breath, sleep apnea ,    Pulmonary exam normal        Cardiovascular hypertension, + CAD  Normal cardiovascular exam     Neuro/Psych  Headaches,    GI/Hepatic GERD  Medicated and Controlled,  Endo/Other    Renal/GU CRFRenal disease     Musculoskeletal  (+) Arthritis ,   Abdominal   Peds  Hematology  (+) Blood dyscrasia, anemia ,   Anesthesia Other Findings Past Medical History: No date: Anemia     Comment:  is followed by renal doctor and pcp No date: Arthritis 04/2016: Breast cancer ([place])     Comment:  Rt. breast ca, f/u with radiation 1961: Cancer ([place])     Comment:  uterine cancer No date: Coronary artery disease No date: Dyspnea No date: GERD (gastroesophageal reflux disease) No date: Hypertension No date: Kidney disease     Comment:  stage 4.  followed by dr. Sedonia Small No date: Lower extremity edema No date: Personal history of radiation therapy No date: S/P lumpectomy, right breast     Comment:  having done on 04/26/16 No date: Sleep apnea     Comment:  uses cpap   Reproductive/Obstetrics                            Anesthesia Physical Anesthesia Plan  ASA: III and emergent  Anesthesia Plan: General   Post-op Pain Management:    Induction: Intravenous  PONV Risk Score and Plan: Ondansetron and Treatment may vary due to age or medical condition  Airway Management Planned: LMA  Additional Equipment:   Intra-op Plan:   Post-operative Plan: Extubation in OR  Informed Consent: I have reviewed the patients History and Physical, chart, labs and  discussed the procedure including the risks, benefits and alternatives for the proposed anesthesia with the patient or authorized representative who has indicated his/her understanding and acceptance.     Dental Advisory Given  Plan Discussed with: CRNA  Anesthesia Plan Comments: (Previous #4 LMA without problem. Recent Eliquis precludes spinal use.)        Anesthesia Quick Evaluation

## 2020-01-03 NOTE — NC FL2 (Signed)
[place] LEVEL OF CARE SCREENING TOOL     IDENTIFICATION  Patient Name: Rachel Jones Birthdate: Jan 10, 1934 Sex: female Admission Date (Current Location): 01/01/2020  [facility] and [place] Number:  Engineering geologist and Address:  [facility], [address] [place], [place] [address]      Provider Number:    Attending Physician Name and Address:  Fritzi Mandes, MD  Relative Name and Phone Number:  Andria Meuse (Daughter) [phone]    Current Level of Care: Hospital Recommended Level of Care: [place] (Hip surgery 8/14) Prior Approval Number:    Date Approved/Denied:   PASRR Number:    Discharge Plan: SNF    Current Diagnoses: Patient Active Problem List   Diagnosis Date Noted  . Closed right hip fracture ([facility]) 01/02/2020  . Chronic anticoagulation   . Chronic deep vein thrombosis (DVT) of proximal vein of lower extremity ([place]) 02/06/2019  . B12 deficiency 10/23/2017  . Hydronephrosis 11/18/2016  . Gout 11/18/2016  . Cervical cancer ([place]) 11/18/2016  . Carotid stenosis 04/04/2016  . Primary cancer of upper inner quadrant of right female breast ([place]) 04/04/2016  . Lymphedema 04/04/2016  . Chronic venous insufficiency 04/04/2016  . Chest tightness 02/15/2016  . Obstructive sleep apnea syndrome 08/13/2015  . SOB (shortness of breath) 12/18/2014  . Chronic fatigue 07/09/2014  . CKD (chronic kidney disease), stage IV ([place]) 05/09/2014  . Atypical chest pain 05/09/2014  . Chronic tension-type headache, intractable 03/06/2014  . Headache 02/06/2014  . Facial numbness 02/06/2014  . Rheumatoid arthritis involving multiple sites with positive rheumatoid factor ([place]) 11/01/2013  . Peripheral edema 11/01/2013  . Post herpetic neuralgia 11/01/2013  . Hyperlipidemia 11/01/2013  . HTN (hypertension) 11/01/2013  . History of recurrent UTIs 11/01/2013  . H/O: hysterectomy 11/01/2013  . GERD (gastroesophageal  reflux disease) 11/01/2013  . Chronic headache 11/01/2013  . Anemia 11/01/2013    Orientation RESPIRATION BLADDER Height & Weight     Self, Time, Situation, Place  Normal Continent, External catheter Weight: 180 lb (81.6 kg) Height:  5\' 3"  (160 cm)  BEHAVIORAL SYMPTOMS/MOOD NEUROLOGICAL BOWEL NUTRITION STATUS      Continent    AMBULATORY STATUS COMMUNICATION OF NEEDS Skin   Extensive Assist Verbally                         Personal Care Assistance Level of Assistance  Bathing, Dressing Bathing Assistance: Independent   Dressing Assistance: Limited assistance     Functional Limitations Info             SPECIAL CARE FACTORS FREQUENCY  PT (By licensed PT), OT (By licensed OT)     PT Frequency: 5 x weekly OT Frequency: 5 x weekly            Contractures      Additional Factors Info  Code Status Code Status Info: Full             Current Medications (01/03/2020):  This is the current hospital active medication list Current Facility-Administered Medications  Medication Dose Route Frequency Provider Last Rate Last Admin  . acetaminophen (TYLENOL) tablet 650 mg  650 mg Oral Q6H PRN Mansy, Jan A, MD       Or  . acetaminophen (TYLENOL) suppository 650 mg  650 mg Rectal Q6H PRN Mansy, Jan A, MD      . calcium-vitamin D (OSCAL WITH D) 500-200 MG-UNIT per tablet 1 tablet  1 tablet  Oral BID Mansy, Arvella Merles, MD   1 tablet at 01/03/20 [phone]  . cholecalciferol (VITAMIN D3) tablet 1,000 Units  1,000 Units Oral Daily Mansy, Arvella Merles, MD   1,000 Units at 01/03/20 0936  . docusate sodium (COLACE) capsule 100 mg  100 mg Oral BID Mansy, Jan A, MD   100 mg at 01/03/20 0936  . ferrous gluconate (FERGON) tablet 324 mg  324 mg Oral Daily Mansy, Jan A, MD   324 mg at 01/03/20 0943  . furosemide (LASIX) tablet 20 mg  20 mg Oral Daily PRN Mansy, Jan A, MD      . heparin ADULT infusion 100 units/mL (25000 units/267mL sodium chloride 0.45%)  750 Units/hr Intravenous Continuous Fritzi Mandes,  MD 7.5 mL/hr at 01/03/20 0736 750 Units/hr at 01/03/20 0736  . losartan (COZAAR) tablet 50 mg  50 mg Oral Daily Mansy, Jan A, MD   50 mg at 01/02/20 2046  . metoprolol succinate (TOPROL-XL) 24 hr tablet 25 mg  25 mg Oral Daily Mansy, Jan A, MD   25 mg at 01/02/20 2047  . morphine 2 MG/ML injection 2 mg  2 mg Intravenous Q4H PRN Mansy, Jan A, MD   2 mg at 01/02/20 2043  . ondansetron (ZOFRAN) tablet 4 mg  4 mg Oral Q6H PRN Mansy, Jan A, MD       Or  . ondansetron [facility]) injection 4 mg  4 mg Intravenous Q6H PRN Mansy, Jan A, MD   4 mg at 01/02/20 2039  . oxyCODONE (Oxy IR/ROXICODONE) immediate release tablet 5 mg  5 mg Oral Q4H PRN Mansy, Jan A, MD   5 mg at 01/03/20 0936  . pantoprazole (PROTONIX) EC tablet 40 mg  40 mg Oral Daily Mansy, Jan A, MD   40 mg at 01/03/20 0936  . pravastatin (PRAVACHOL) tablet 40 mg  40 mg Oral q1800 Mansy, Jan A, MD   40 mg at 01/02/20 1730  . tamoxifen (NOLVADEX) tablet 20 mg  20 mg Oral Daily Mansy, Jan A, MD   20 mg at 01/02/20 2046     Discharge Medications: Please see discharge summary for a list of discharge medications.  Relevant Imaging Results:  Relevant Lab Results:   Additional Information SS# [id number]  Meriel Flavors, LCSW

## 2020-01-03 NOTE — Consult Note (Signed)
ANTICOAGULATION CONSULT NOTE - Follow up [place] for Heparin drip Indication: VTE treatment  Allergies  Allergen Reactions  . Cefuroxime Axetil Diarrhea  . Codeine Nausea Only    Intolerance instead of true allerg  . Sulfa Antibiotics Swelling and Rash    swelling    Patient Measurements: Height: 5\' 3"  (160 cm) Weight: 81.6 kg (180 lb) IBW/kg (Calculated) : 52.4 Heparin Dosing Weight: 70.3kg  Vital Signs: Temp: 99.6 F (37.6 C) (08/13 1601) Temp Source: Oral (08/13 1601) BP: 137/61 (08/13 1601) Pulse Rate: 87 (08/13 1601)  Labs: Recent Labs    01/01/20 1344 01/01/20 1344 01/02/20 0343 01/02/20 1324 01/02/20 1324 01/02/20 2233 01/03/20 0607 01/03/20 1540  HGB 9.4*   < > 9.6*  --   --   --  8.6*  --   HCT 29.5*  --  28.9*  --   --   --  27.0*  --   PLT 166  --  173  --   --   --  151  --   APTT  --   --   --  41*   < > 134* 103* 69*  LABPROT  --   --   --  14.8  --   --   --   --   INR  --   --   --  1.2  --   --   --   --   HEPARINUNFRC  --   --   --  1.70*  --   --   --  0.84*  CREATININE 1.95*  --  1.74*  --   --   --   --   --    < > = values in this interval not displayed.    Estimated Creatinine Clearance: 24.4 mL/min (A) (by C-G formula based on SCr of 1.74 mg/dL (H)).   Medical History: Past Medical History:  Diagnosis Date  . Anemia    is followed by renal doctor and pcp  . Arthritis   . Breast cancer ([place]) 04/2016   Rt. breast ca, f/u with radiation  . Cancer [facility]) 1961   uterine cancer  . Coronary artery disease   . Dyspnea   . GERD (gastroesophageal reflux disease)   . Hypertension   . Kidney disease    stage 4.  followed by dr. Sedonia Small  . Lower extremity edema   . Personal history of radiation therapy   . S/P lumpectomy, right breast    having done on 04/26/16  . Sleep apnea    uses cpap    Medications:  Pt home dose of Eliquis (suspended pending hip surgery this Saturday 8/14) 2.5mg  bid - last taken 8/11 @  1930  Assessment: 86 yo femalewith a known history of CAD, GERD, HTN, CKD4, OSA, and chronic DVT since 02/06/2019 who presented to the emergency room with acute onset of accidental mechanical fall on Friday. Pharmacy has been consulted to start a heparin drip while holding Eliquis prior to surgery.  Heparin drip started at 1000 units/hr  0813 @ 0600 aPTT 103 sec (slightly supratherapeutic) - dec rate to 750 units/hr  0813 @ 1540 aPTT 69 sec   Goal of Therapy:  aPTT 66-102 seconds Monitor platelets by anticoagulation protocol: Yes   Plan:  Therapeutic, but low end - levels do not correlate.    Will increase rate to 800 units/hr and recheck aPTT in 8 hours.  Pharmacy will continue to follow.   Pierce Crane  Leslye Peer, PharmD, BCPS Clinical Pharmacist 01/03/2020 4:27 PM

## 2020-01-04 ENCOUNTER — Inpatient Hospital Stay: Payer: Medicare HMO | Admitting: Anesthesiology

## 2020-01-04 ENCOUNTER — Inpatient Hospital Stay: Payer: Medicare HMO

## 2020-01-04 ENCOUNTER — Encounter
Admission: EM | Disposition: A | Discharge status: Skilled Nursing Facility | Payer: Self-pay | Source: Home / Self Care | Attending: Internal Medicine

## 2020-01-04 DIAGNOSIS — Z7901 Long term (current) use of anticoagulants: Secondary | ICD-10-CM | POA: Diagnosis not present

## 2020-01-04 DIAGNOSIS — S72001D Fracture of unspecified part of neck of right femur, subsequent encounter for closed fracture with routine healing: Secondary | ICD-10-CM

## 2020-01-04 DIAGNOSIS — N184 Chronic kidney disease, stage 4 (severe): Secondary | ICD-10-CM | POA: Diagnosis not present

## 2020-01-04 HISTORY — PX: HIP PINNING,CANNULATED: SHX1758

## 2020-01-04 LAB — SURGICAL PCR SCREEN
MRSA, PCR: POSITIVE — AB
Staphylococcus aureus: POSITIVE — AB

## 2020-01-04 LAB — CBC
HCT: 24.6 % — ABNORMAL LOW (ref 36.0–46.0)
Hemoglobin: 7.7 g/dL — ABNORMAL LOW (ref 12.0–15.0)
MCH: 30.9 pg (ref 26.0–34.0)
MCHC: 31.3 g/dL (ref 30.0–36.0)
MCV: 98.8 fL (ref 80.0–100.0)
Platelets: 165 10*3/uL (ref 150–400)
RBC: 2.49 MIL/uL — ABNORMAL LOW (ref 3.87–5.11)
RDW: 12.6 % (ref 11.5–15.5)
WBC: 8.8 10*3/uL (ref 4.0–10.5)
nRBC: 0 % (ref 0.0–0.2)

## 2020-01-04 LAB — HEPARIN LEVEL (UNFRACTIONATED): Heparin Unfractionated: 0.47 IU/mL (ref 0.30–0.70)

## 2020-01-04 LAB — APTT: aPTT: 75 seconds — ABNORMAL HIGH (ref 24–36)

## 2020-01-04 SURGERY — FIXATION, FEMUR, NECK, PERCUTANEOUS, USING SCREW
Anesthesia: General | Site: Hip | Laterality: Right

## 2020-01-04 MED ORDER — PROPOFOL 10 MG/ML IV BOLUS
INTRAVENOUS | Status: DC | PRN
  Administered 2020-01-04: 100 mg via INTRAVENOUS

## 2020-01-04 MED ORDER — CHLORHEXIDINE GLUCONATE CLOTH 2 % EX PADS
6.0000 | MEDICATED_PAD | Freq: Every day | CUTANEOUS | Status: DC
  Administered 2020-01-04: 6 via TOPICAL

## 2020-01-04 MED ORDER — MUPIROCIN 2 % EX OINT
1.0000 "application " | TOPICAL_OINTMENT | Freq: Two times a day (BID) | CUTANEOUS | Status: DC
  Administered 2020-01-04 – 2020-01-07 (×7): 1 via NASAL
  Filled 2020-01-04: qty 22

## 2020-01-04 MED ORDER — FENTANYL CITRATE (PF) 100 MCG/2ML IJ SOLN
INTRAMUSCULAR | Status: DC | PRN
  Administered 2020-01-04 (×2): 25 ug via INTRAVENOUS
  Administered 2020-01-04: 50 ug via INTRAVENOUS

## 2020-01-04 MED ORDER — ONDANSETRON HCL 4 MG/2ML IJ SOLN: 4.0000 mg | Freq: Four times a day (QID) | INTRAMUSCULAR | Status: DC | PRN

## 2020-01-04 MED ORDER — FENTANYL CITRATE (PF) 100 MCG/2ML IJ SOLN
INTRAMUSCULAR | Status: AC
  Filled 2020-01-04: qty 2

## 2020-01-04 MED ORDER — ACETAMINOPHEN 325 MG PO TABS: 325.0000 mg | ORAL_TABLET | ORAL | Status: DC | PRN

## 2020-01-04 MED ORDER — LACTATED RINGERS IV SOLN: INTRAVENOUS | Status: DC | PRN

## 2020-01-04 MED ORDER — SODIUM CHLORIDE 0.9 % IR SOLN
Status: DC | PRN
  Administered 2020-01-04: 150 mL

## 2020-01-04 MED ORDER — PROPOFOL 10 MG/ML IV BOLUS
INTRAVENOUS | Status: AC
  Filled 2020-01-04: qty 20

## 2020-01-04 MED ORDER — BISACODYL 10 MG RE SUPP: 10.0000 mg | Freq: Every day | RECTAL | Status: DC | PRN

## 2020-01-04 MED ORDER — ONDANSETRON HCL 4 MG PO TABS: 4.0000 mg | ORAL_TABLET | Freq: Four times a day (QID) | ORAL | Status: DC | PRN

## 2020-01-04 MED ORDER — SODIUM CHLORIDE 0.9 % IV SOLN
75.0000 mL/h | INTRAVENOUS | Status: DC
  Administered 2020-01-04 – 2020-01-05 (×2): 75 mL/h via INTRAVENOUS

## 2020-01-04 MED ORDER — SENNA 8.6 MG PO TABS
1.0000 | ORAL_TABLET | Freq: Two times a day (BID) | ORAL | Status: DC
  Administered 2020-01-04 – 2020-01-07 (×5): 8.6 mg via ORAL
  Filled 2020-01-04 (×6): qty 1

## 2020-01-04 MED ORDER — APIXABAN 2.5 MG PO TABS
2.5000 mg | ORAL_TABLET | Freq: Two times a day (BID) | ORAL | Status: DC
  Administered 2020-01-05 – 2020-01-07 (×4): 2.5 mg via ORAL
  Filled 2020-01-04 (×5): qty 1

## 2020-01-04 MED ORDER — ACETAMINOPHEN 500 MG PO TABS
ORAL_TABLET | ORAL | Status: AC
  Administered 2020-01-04: 500 mg via ORAL
  Filled 2020-01-04: qty 1

## 2020-01-04 MED ORDER — KETOROLAC TROMETHAMINE 15 MG/ML IJ SOLN
7.5000 mg | Freq: Four times a day (QID) | INTRAMUSCULAR | Status: AC
  Administered 2020-01-04 – 2020-01-05 (×3): 7.5 mg via INTRAVENOUS
  Filled 2020-01-04 (×3): qty 1

## 2020-01-04 MED ORDER — SODIUM CHLORIDE 0.9 % IV SOLN: INTRAVENOUS | Status: DC | PRN

## 2020-01-04 MED ORDER — MORPHINE SULFATE (PF) 2 MG/ML IV SOLN: 0.5000 mg | INTRAVENOUS | Status: DC | PRN

## 2020-01-04 MED ORDER — MAGNESIUM CITRATE PO SOLN
1.0000 | Freq: Once | ORAL | Status: DC | PRN
  Filled 2020-01-04: qty 296

## 2020-01-04 MED ORDER — PHENOL 1.4 % MT LIQD
1.0000 | OROMUCOSAL | Status: DC | PRN
  Filled 2020-01-04: qty 177

## 2020-01-04 MED ORDER — ALUM & MAG HYDROXIDE-SIMETH 200-200-20 MG/5ML PO SUSP: 30.0000 mL | ORAL | Status: DC | PRN

## 2020-01-04 MED ORDER — FENTANYL CITRATE (PF) 100 MCG/2ML IJ SOLN
INTRAMUSCULAR | Status: AC
  Administered 2020-01-04: 25 ug via INTRAVENOUS
  Filled 2020-01-04: qty 2

## 2020-01-04 MED ORDER — POLYETHYLENE GLYCOL 3350 17 G PO PACK
17.0000 g | PACK | Freq: Every day | ORAL | Status: DC | PRN
  Administered 2020-01-05: 17 g via ORAL
  Filled 2020-01-04: qty 1

## 2020-01-04 MED ORDER — KETOROLAC TROMETHAMINE 15 MG/ML IJ SOLN
INTRAMUSCULAR | Status: AC
  Administered 2020-01-04: 7.5 mg via INTRAVENOUS
  Filled 2020-01-04: qty 1

## 2020-01-04 MED ORDER — MENTHOL 3 MG MT LOZG
1.0000 | LOZENGE | OROMUCOSAL | Status: DC | PRN
  Filled 2020-01-04: qty 9

## 2020-01-04 MED ORDER — PHENYLEPHRINE HCL (PRESSORS) 10 MG/ML IV SOLN
INTRAVENOUS | Status: DC | PRN
  Administered 2020-01-04 (×4): 100 ug via INTRAVENOUS

## 2020-01-04 MED ORDER — CEFAZOLIN SODIUM-DEXTROSE 1-4 GM/50ML-% IV SOLN
1.0000 g | Freq: Four times a day (QID) | INTRAVENOUS | Status: DC
  Filled 2020-01-04 (×2): qty 50

## 2020-01-04 MED ORDER — HYDROCODONE-ACETAMINOPHEN 5-325 MG PO TABS
1.0000 | ORAL_TABLET | ORAL | Status: DC | PRN
  Administered 2020-01-04 – 2020-01-06 (×4): 1 via ORAL
  Administered 2020-01-07: 2 via ORAL
  Filled 2020-01-04 (×3): qty 1
  Filled 2020-01-04: qty 2
  Filled 2020-01-04 (×2): qty 1

## 2020-01-04 MED ORDER — HYDROCODONE-ACETAMINOPHEN 7.5-325 MG PO TABS
1.0000 | ORAL_TABLET | ORAL | Status: DC | PRN
  Administered 2020-01-05: 1 via ORAL
  Filled 2020-01-04: qty 1

## 2020-01-04 MED ORDER — ACETAMINOPHEN 160 MG/5ML PO SOLN
325.0000 mg | ORAL | Status: DC | PRN
  Filled 2020-01-04: qty 20.3

## 2020-01-04 MED ORDER — CEFAZOLIN SODIUM-DEXTROSE 2-4 GM/100ML-% IV SOLN
2.0000 g | Freq: Two times a day (BID) | INTRAVENOUS | Status: AC
  Administered 2020-01-04 – 2020-01-06 (×6): 2 g via INTRAVENOUS
  Filled 2020-01-04 (×7): qty 100

## 2020-01-04 MED ORDER — DROPERIDOL 2.5 MG/ML IJ SOLN
0.6250 mg | Freq: Once | INTRAMUSCULAR | Status: DC | PRN
  Filled 2020-01-04: qty 2

## 2020-01-04 MED ORDER — ONDANSETRON HCL 4 MG/2ML IJ SOLN: 4.0000 mg | Freq: Once | INTRAMUSCULAR | Status: DC | PRN

## 2020-01-04 MED ORDER — LIDOCAINE HCL (CARDIAC) PF 100 MG/5ML IV SOSY
PREFILLED_SYRINGE | INTRAVENOUS | Status: DC | PRN
  Administered 2020-01-04: 100 mg via INTRAVENOUS

## 2020-01-04 MED ORDER — DEXAMETHASONE SODIUM PHOSPHATE 10 MG/ML IJ SOLN
INTRAMUSCULAR | Status: DC | PRN
  Administered 2020-01-04: 5 mg via INTRAVENOUS

## 2020-01-04 MED ORDER — FENTANYL CITRATE (PF) 100 MCG/2ML IJ SOLN
25.0000 ug | INTRAMUSCULAR | Status: AC | PRN
  Administered 2020-01-04 (×4): 25 ug via INTRAVENOUS

## 2020-01-04 MED ORDER — ACETAMINOPHEN 500 MG PO TABS
500.0000 mg | ORAL_TABLET | Freq: Four times a day (QID) | ORAL | Status: AC
  Administered 2020-01-04 – 2020-01-05 (×3): 500 mg via ORAL
  Filled 2020-01-04 (×3): qty 1

## 2020-01-04 SURGICAL SUPPLY — 33 items
BIT DRILL CANN LRG QC 5X300 (BIT) ×3 IMPLANT
BLADE SURG SZ10 CARB STEEL (BLADE) ×3 IMPLANT
BNDG COHESIVE 6X5 TAN STRL LF (GAUZE/BANDAGES/DRESSINGS) ×6 IMPLANT
CANISTER SUCT 1200ML W/VALVE (MISCELLANEOUS) ×3 IMPLANT
COVER WAND RF STERILE (DRAPES) ×3 IMPLANT
DRAPE SURG 17X11 SM STRL (DRAPES) ×6 IMPLANT
DRAPE U-SHAPE 47X51 STRL (DRAPES) ×3 IMPLANT
DRSG OPSITE POSTOP 3X4 (GAUZE/BANDAGES/DRESSINGS) IMPLANT
DRSG OPSITE POSTOP 4X6 (GAUZE/BANDAGES/DRESSINGS) IMPLANT
DURAPREP 26ML APPLICATOR (WOUND CARE) ×6 IMPLANT
ELECT REM PT RETURN 9FT ADLT (ELECTROSURGICAL) ×3
ELECTRODE REM PT RTRN 9FT ADLT (ELECTROSURGICAL) ×1 IMPLANT
GAUZE SPONGE 4X4 12PLY STRL (GAUZE/BANDAGES/DRESSINGS) ×3 IMPLANT
GLOVE BIOGEL PI IND STRL 9 (GLOVE) ×1 IMPLANT
GLOVE BIOGEL PI INDICATOR 9 (GLOVE) ×2
GLOVE SURG 9.0 ORTHO LTXF (GLOVE) ×6 IMPLANT
GOWN STRL REUS TWL 2XL XL LVL4 (GOWN DISPOSABLE) ×3 IMPLANT
GOWN STRL REUS W/ TWL LRG LVL3 (GOWN DISPOSABLE) ×1 IMPLANT
GOWN STRL REUS W/TWL LRG LVL3 (GOWN DISPOSABLE) ×3
GUIDEWIRE THREADED 2.8 (WIRE) ×9 IMPLANT
HOLDER FOLEY CATH W/STRAP (MISCELLANEOUS) IMPLANT
MAT ABSORB  FLUID 56X50 GRAY (MISCELLANEOUS) ×2
MAT ABSORB FLUID 56X50 GRAY (MISCELLANEOUS) ×1 IMPLANT
NS IRRIG 1000ML POUR BTL (IV SOLUTION) ×3 IMPLANT
PACK HIP COMPR (MISCELLANEOUS) ×3 IMPLANT
SCREW CANN 32 THRD/95 7.3 (Screw) ×3 IMPLANT
SCREW CANN THREADED 7.3X85 (Screw) ×6 IMPLANT
STAPLER SKIN PROX 35W (STAPLE) ×3 IMPLANT
STRAP SAFETY 5IN WIDE (MISCELLANEOUS) ×3 IMPLANT
SUT VIC AB 0 CT1 36 (SUTURE) ×3 IMPLANT
SUT VIC AB 2-0 CT2 27 (SUTURE) ×3 IMPLANT
SUT VICRYL 0 AB UR-6 (SUTURE) ×6 IMPLANT
TRAY FOLEY MTR SLVR 16FR STAT (SET/KITS/TRAYS/PACK) IMPLANT

## 2020-01-04 NOTE — Op Note (Signed)
01/04/2020  11:47 AM  PATIENT:  Rachel Jones    PRE-OPERATIVE DIAGNOSIS: Nondisplaced right Femoral Hip Fracture  POST-OPERATIVE DIAGNOSIS:  Same  PROCEDURE: Percutaneous fixation of nondisplaced right femoral neck hip fracture with cannulated screws  SURGEON:  [place], MD  ANESTHESIA:   Spinal  EBL:  Minimal  PREOPERATIVE INDICATIONS:  KAYLONI ROCCO is a  86 y.o. female who fell and was found to have a diagnosis of nondisplaced right femoral neck hip Fracture .  Percutaneous fixation has been recommended for fixation of this non-displaced fracture.      The risks benefits and alternatives were discussed with the patient preoperatively including but not limited to infection, bleeding, nerve or blood vessel injury, persistent hip pain,  malunion, nonunion, avascular necrosis, change in lower extremity rotation, leg length discrepancy, failure of the hardware and the need for revision surgery, including the potential for conversion to hemi or total hip arthroplasty. Medical risks include but are not limited to: DVT and pulmonary embolism, myocardial infarction, stroke, pneumonia, respiratory failure and death.  The patient understood and agreed with the plan for surgery.  Patient had the right hip marked with the word yes and my initials according the hospitals correct site of surgery protocol.  OPERATIVE IMPLANTS: Synthes 7.3 mm cannulated screws x 3  OPERATIVE FINDINGS: Nondisplaced right femoral neck hip fracture  OPERATIVE PROCEDURE: The patient was brought to the operating room and underwent general anesthesia with an LMA.  Patient presented on Eliquis which delayed surgery and prevented a spinal anesthetic.  She was then placed supine on the fracture table. IV Kefzol 2 g IV was administered for antibiotic prophylaxis. The right lower extremity was positioned in a leg holder, without any significant reduction maneuver other than mild internal rotation.  The well leg was placed  in hemi-lithotomy position. The hip was prepped and draped in usual sterile fashion.  A time out was performed to verify the patient's name, date of birth, medical record number, correct site of surgery and correct surger to be performed. The  timeout was also used to verify the patient had received antibiotics that all appropriate instruments, implants and radiographic studies were available in the room. Once all in attendance were in agreement case began..  Once the reduction was deemed near-anatomic, a small lateral incision was made in line with the femur, distal to the greater trochanter, and 3 threaded guidewires were introduced Into the lateral cortex of the femur, across the fracture site and into the femoral head in an inverted triangle configuration. The lengths of these guidepins were measured with a depth gauge. The lateral cortex was then opened with a cannulated drill, and then the cannulated screws were advanced into position and tightened by hand. Solid fixation was achieved.  The guide pins were then removed and final C-arm images were taken of the fracture fixation. The fracture was well reduced and the hardware in good position.  The wound was irrigated copiously, and the deep and subcutaneous tissues were repaired with 0 and 2-0 Vicryl suture respectively and the skin was approximated with staples.  I was scrubbed and present the entire case and all sharp and instrument counts were correct at the conclusion of the case.  I spoke with the patient's family by phone to let them know the case was completed without complication and the patient was stable in the recovery room.    Timoteo Gaul, MD

## 2020-01-04 NOTE — Progress Notes (Signed)
PROGRESS NOTE    Rachel Jones  QQV:[id number] DOB: [birth year] DOA: 01/01/2020 PCP: Idelle Crouch, MD    Brief Narrative:  Rachel Jones  is a 86 y.o. Caucasian female with a known history of coronary artery disease, GERD, hypertension, stage IV chronic kidney disease and obstructive sleep apnea, who presented to the emergency room with acute onset of accidental mechanical fall on Friday.  She stumbled and got tripped up in her walker.  She was having right hip and right foot pain after falling.  She was not able to bear weight on her right foot.  She denies any headache or dizziness or blurred vision, paresthesias or focal muscle weakness.  No chest pain or dyspnea or palpitations.  No head injuries.  She denies any fever or chills.  No dysuria, urinary frequency or urgency, oliguria or hematuria or flank pain.  Upon presentation to the emergency room, vital signs were within normal.  Labs revealed a BUN of 27 creatinine 1.95 close to her baseline CBC showed anemia with hemoglobin 9.4 and hematocrit 29.5 worse than her previous levels in 2018.  UA with suspicious for UTI.  Head CT scan showed chronic microvascular disease and cerebral atrophy with no acute intracranial abnormalities.  Right femur x-ray showed no fracture or dislocation but just osteoarthritic change most severe in the hip joint.  Foot x-ray showed suspected residua of old trauma in the calcaneus with no fracture or dislocation.  It showed pes planus and calcification the medial first MTP joint likely due to to calcium pyrophosphate deposition disease.  It shows osteoporosis.  Pelvic x-ray showed severe osteoarthritic change in the right hip joint with osteoporotic bones without fracture or dislocation.  Right hip/fib x-ray showed osteoporotic bones, knee and ankle joint osteoarthritic changes with no fracture or dislocation.  CT of the right hip showed asymmetric osteoarthritis in the right hip with no acute fracture or dislocation.   Right foot CT showed no fracture.    8/14: Patient seen and examined this morning.  Low-grade temperature noted overnight.  T-max 100.7.  Patient only complaint is pain in the left leg this morning.  Son at bedside.  Plan for operating room today with orthopedics.  Assessment & Plan:   Active Problems:   CKD (chronic kidney disease), stage IV (HCC)   Closed right hip fracture (HCC)   Chronic anticoagulation  Nondisplaced right femoral neck fracture-- after fall and confirmed on MRI hip Patient scheduled for operating room with Dr. Mack Guise today She has been on heparin drip for perioperative bridging Plan: N.p.o. for OR today Pain control Heparin and stop time to be managed by pharmacy  UTI  Patient had some fever overnight.  T-max 100.7 Urine culture demonstrating pansensitive Klebsiella Patient received fosfomycin in the ED.  Unclear whether this has activity against Klebsiella Given low-grade temp will likely treat as UTI Patient received a dose of perioperative Ancef yesterday Will continue to dose postoperatively for 3 days for uncomplicated UTI  Hypertension. Continue losartan and metoprolol  Stage IV chronic kidney disease, stable. -creatinine stable  GERD. Continue PPI  Dyslipidemia. Continue statin  DVT prophylaxis. -SCDs. Heparin drip  History of DVT lower extremity -patient has been on eliquis for more than a year. She tells me her primary care physician Dr. Doy Hutching and cardiologist Dr. Ubaldo Glassing wanted her to be on it indefinitely -pharmacy consulted for heparin bridging preoperatively.    DVT prophylaxis: Heparin GTT  code Status: Full Family Communication: None today  disposition Plan: Status  is: Inpatient  Remains inpatient appropriate because:Inpatient level of care appropriate due to severity of illness   Dispo: The patient is from: Home              Anticipated d/c is to: SNF              Anticipated d/c date is: 2 days              Patient  currently is not medically stable to d/c.   OR today with orthopedics for hip fracture     Consultants:   Orthopedics  Procedures:   Hip fracture repair, 01/04/2020  Antimicrobials:   Ancef, 1 dose perioperatively   Subjective: Seen and examined.  Complains of pain in the left hip  Objective: Vitals:   01/03/20 2323 01/04/20 0211 01/04/20 0521 01/04/20 0807  BP: (!) 155/62   124/82  Pulse: 91   69  Resp: 18   17  Temp: (!) 100.7 F (38.2 C) (!) 100.4 F (38 C) 98.3 F (36.8 C) 98.9 F (37.2 C)  TempSrc: Oral  Oral Oral  SpO2: 96%   99%  Weight:      Height:        Intake/Output Summary (Last 24 hours) at 01/04/2020 1116 Last data filed at 01/04/2020 1042 Gross per 24 hour  Intake 355.57 ml  Output --  Net 355.57 ml   Filed Weights   01/01/20 1254 01/02/20 0237  Weight: 81.6 kg 81.6 kg    Examination:  General exam: No acute distress.  Appears stated age Respiratory system: Clear to auscultation. Respiratory effort normal. Cardiovascular system: S1 & S2 heard, RRR. No JVD, murmurs, rubs, gallops or clicks. No pedal edema. Gastrointestinal system: Abdomen is nondistended, soft and nontender. No organomegaly or masses felt. Normal bowel sounds heard. Central nervous system: Alert and oriented. No focal neurological deficits. Extremities: Left lower extremity shortened and externally rotated Skin: No rashes, lesions or ulcers Psychiatry: Judgement and insight appear normal. Mood & affect appropriate.     Data Reviewed: I have personally reviewed following labs and imaging studies  CBC: Recent Labs  Lab 01/01/20 1344 01/02/20 0343 01/03/20 0607 01/04/20 0430  WBC 8.0 7.7 7.1 8.8  HGB 9.4* 9.6* 8.6* 7.7*  HCT 29.5* 28.9* 27.0* 24.6*  MCV 98.7 95.4 98.9 98.8  PLT 166 173 151 948   Basic Metabolic Panel: Recent Labs  Lab 01/01/20 1344 01/02/20 0343  NA 140 141  K 4.5 4.7  CL 108 106  CO2 21* 25  GLUCOSE 101* 92  BUN 27* 27*  CREATININE  1.95* 1.74*  CALCIUM 7.9* 8.2*   GFR: Estimated Creatinine Clearance: 24.4 mL/min (A) (by C-G formula based on SCr of 1.74 mg/dL (H)). Liver Function Tests: No results for input(s): AST, ALT, ALKPHOS, BILITOT, PROT, ALBUMIN in the last 168 hours. No results for input(s): LIPASE, AMYLASE in the last 168 hours. No results for input(s): AMMONIA in the last 168 hours. Coagulation Profile: Recent Labs  Lab 01/02/20 1324  INR 1.2   Cardiac Enzymes: No results for input(s): CKTOTAL, CKMB, CKMBINDEX, TROPONINI in the last 168 hours. BNP (last 3 results) No results for input(s): PROBNP in the last 8760 hours. HbA1C: No results for input(s): HGBA1C in the last 72 hours. CBG: No results for input(s): GLUCAP in the last 168 hours. Lipid Profile: No results for input(s): CHOL, HDL, LDLCALC, TRIG, CHOLHDL, LDLDIRECT in the last 72 hours. Thyroid Function Tests: No results for input(s): TSH, T4TOTAL, FREET4, T3FREE, THYROIDAB  in the last 72 hours. Anemia Panel: No results for input(s): VITAMINB12, FOLATE, FERRITIN, TIBC, IRON, RETICCTPCT in the last 72 hours. Sepsis Labs: No results for input(s): PROCALCITON, LATICACIDVEN in the last 168 hours.  Recent Results (from the past 240 hour(s))  Urine Culture     Status: Abnormal   Collection Time: 01/01/20  1:44 PM   Specimen: Urine, Random  Result Value Ref Range Status   Specimen Description   Final    URINE, RANDOM Performed at [facility], [address], [place], [place] [address]    Special Requests   Final    NONE Performed at [facility], [place], [place] [address]    Culture >=100,000 COLONIES/mL KLEBSIELLA PNEUMONIAE (A)  Final   Report Status 01/03/2020 FINAL  Final   Organism ID, Bacteria KLEBSIELLA PNEUMONIAE (A)  Final      Susceptibility   Klebsiella pneumoniae - MIC*    AMPICILLIN RESISTANT Resistant     CEFAZOLIN <=4 SENSITIVE Sensitive     CEFTRIAXONE <=0.25 SENSITIVE Sensitive       CIPROFLOXACIN <=0.25 SENSITIVE Sensitive     GENTAMICIN <=1 SENSITIVE Sensitive     IMIPENEM <=0.25 SENSITIVE Sensitive     NITROFURANTOIN 32 SENSITIVE Sensitive     TRIMETH/SULFA <=20 SENSITIVE Sensitive     AMPICILLIN/SULBACTAM 4 SENSITIVE Sensitive     PIP/TAZO <=4 SENSITIVE Sensitive     * >=100,000 COLONIES/mL KLEBSIELLA PNEUMONIAE  SARS Coronavirus 2 by RT PCR (hospital order, performed in [place] hospital lab) Nasopharyngeal Nasopharyngeal Swab     Status: None   Collection Time: 01/02/20  1:03 AM   Specimen: Nasopharyngeal Swab  Result Value Ref Range Status   SARS Coronavirus 2 NEGATIVE NEGATIVE Final    Comment: (NOTE) SARS-CoV-2 target nucleic acids are NOT DETECTED.  The SARS-CoV-2 RNA is generally detectable in upper and lower respiratory specimens during the acute phase of infection. The lowest concentration of SARS-CoV-2 viral copies this assay can detect is 250 copies / mL. A negative result does not preclude SARS-CoV-2 infection and should not be used as the sole basis for treatment or other patient management decisions.  A negative result may occur with improper specimen collection / handling, submission of specimen other than nasopharyngeal swab, presence of viral mutation(s) within the areas targeted by this assay, and inadequate number of viral copies (<250 copies / mL). A negative result must be combined with clinical observations, patient history, and epidemiological information.  Fact Sheet for Patients:   StrictlyIdeas.no  Fact Sheet for Healthcare Providers: BankingDealers.co.za  This test is not yet approved or  cleared by the [place] FDA and has been authorized for detection and/or diagnosis of SARS-CoV-2 by FDA under an Emergency Use Authorization (EUA).  This EUA will remain in effect (meaning this test can be used) for the duration of the COVID-19 declaration under Section 564(b)(1) of the  Act, 21 U.S.C. section 360bbb-3(b)(1), unless the authorization is terminated or revoked sooner.  Performed at [facility], [address] [place], [place] [address]   Surgical pcr screen     Status: Abnormal   Collection Time: 01/03/20  9:55 PM   Specimen: Nasal Mucosa; Nasal Swab  Result Value Ref Range Status   MRSA, PCR POSITIVE (A) NEGATIVE Final    Comment: RESULT CALLED TO, READ BACK BY AND VERIFIED WITH: SONYA OGLESBY AT 0016 ON 01/04/20 RWW    Staphylococcus aureus POSITIVE (A) NEGATIVE Final    Comment: (NOTE) The  Xpert SA Assay (FDA approved for NASAL specimens in patients 47 years of age and older), is one component of a comprehensive surveillance program. It is not intended to diagnose infection nor to guide or monitor treatment. Performed at [facility], [address], [place], [place] [address]          Radiology Studies: No results found.      Scheduled Meds: . [MAR Hold] calcium-vitamin D  1 tablet Oral BID  . [MAR Hold] Chlorhexidine Gluconate Cloth  6 each Topical Q0600  . [MAR Hold] cholecalciferol  1,000 Units Oral Daily  . [MAR Hold] docusate sodium  100 mg Oral BID  . [MAR Hold] ferrous gluconate  324 mg Oral Daily  . [MAR Hold] losartan  50 mg Oral Daily  . [MAR Hold] metoprolol succinate  25 mg Oral Daily  . [MAR Hold] mupirocin ointment  1 application Nasal BID  . [MAR Hold] pantoprazole  40 mg Oral Daily  . [MAR Hold] pravastatin  40 mg Oral q1800  . [MAR Hold] tamoxifen  20 mg Oral Daily   Continuous Infusions:   LOS: 2 days    Time spent: 25 minutes    Sidney Ace, MD [facility] Pager [phone]  If 7PM-7AM, please contact night-coverage 01/04/2020, 11:16 AM

## 2020-01-04 NOTE — Consult Note (Signed)
ANTICOAGULATION CONSULT NOTE - Follow up [place] for Heparin drip Indication: VTE treatment  Allergies  Allergen Reactions  . Cefuroxime Axetil Diarrhea  . Codeine Nausea Only    Intolerance instead of true allerg  . Sulfa Antibiotics Swelling and Rash    swelling    Patient Measurements: Height: 5\' 3"  (160 cm) Weight: 81.6 kg (180 lb) IBW/kg (Calculated) : 52.4 Heparin Dosing Weight: 70.3kg  Vital Signs: Temp: 100.4 F (38 C) (08/14 0211) Temp Source: Oral (08/13 2323) BP: 155/62 (08/13 2323) Pulse Rate: 91 (08/13 2323)  Labs: Recent Labs    01/01/20 1344 01/01/20 1344 01/02/20 0343 01/02/20 1324 01/02/20 2233 01/03/20 0607 01/03/20 1540 01/04/20 0101  HGB 9.4*   < > 9.6*  --   --  8.6*  --   --   HCT 29.5*  --  28.9*  --   --  27.0*  --   --   PLT 166  --  173  --   --  151  --   --   APTT  --   --   --  41*   < > 103* 69* 75*  LABPROT  --   --   --  14.8  --   --   --   --   INR  --   --   --  1.2  --   --   --   --   HEPARINUNFRC  --   --   --  1.70*  --   --  0.84*  --   CREATININE 1.95*  --  1.74*  --   --   --   --   --    < > = values in this interval not displayed.    Estimated Creatinine Clearance: 24.4 mL/min (A) (by C-G formula based on SCr of 1.74 mg/dL (H)).   Medical History: Past Medical History:  Diagnosis Date  . Anemia    is followed by renal doctor and pcp  . Arthritis   . Breast cancer ([place]) 04/2016   Rt. breast ca, f/u with radiation  . Cancer [facility]) 1961   uterine cancer  . Coronary artery disease   . Dyspnea   . GERD (gastroesophageal reflux disease)   . Hypertension   . Kidney disease    stage 4.  followed by dr. Sedonia Small  . Lower extremity edema   . Personal history of radiation therapy   . S/P lumpectomy, right breast    having done on 04/26/16  . Sleep apnea    uses cpap    Medications:  Pt home dose of Eliquis (suspended pending hip surgery this Saturday 8/14) 2.5mg  bid - last taken 8/11 @  1930  Assessment: 86 yo femalewith a known history of CAD, GERD, HTN, CKD4, OSA, and chronic DVT since 02/06/2019 who presented to the emergency room with acute onset of accidental mechanical fall on Friday. Pharmacy has been consulted to start a heparin drip while holding Eliquis prior to surgery.  Heparin drip started at 1000 units/hr  0813 @ 0600 aPTT 103 sec (slightly supratherapeutic) - dec rate to 750 units/hr  0813 @ 1540 aPTT 69 sec   Goal of Therapy:  aPTT 66-102 seconds Monitor platelets by anticoagulation protocol: Yes   Plan:  08/14 @ 0100 aPTT 75 seconds therapeutic. Spoke with RN and stated that she had stopped the heparin drip at 0200 for procedure at 0800. F/u Anticoagulation plans post-op.  Tobie Lords, PharmD,  BCPS Clinical Pharmacist 01/04/2020 3:09 AM

## 2020-01-04 NOTE — Anesthesia Procedure Notes (Signed)
Procedure Name: LMA Insertion Date/Time: 01/04/2020 10:32 AM Performed by: Chanetta Marshall, CRNA Pre-anesthesia Checklist: Patient identified, Emergency Drugs available, Suction available and Patient being monitored Patient Re-evaluated:Patient Re-evaluated prior to induction Oxygen Delivery Method: Circle system utilized Preoxygenation: Pre-oxygenation with 100% oxygen Induction Type: IV induction Ventilation: Mask ventilation without difficulty LMA: LMA inserted LMA Size: 4.0 Tube type: Oral Number of attempts: 1 Placement Confirmation: ETT inserted through vocal cords under direct vision,  positive ETCO2 and breath sounds checked- equal and bilateral Tube secured with: Tape Dental Injury: Teeth and Oropharynx as per pre-operative assessment

## 2020-01-04 NOTE — Transfer of Care (Signed)
Immediate Anesthesia Transfer of Care Note  Patient: Rachel Jones  Procedure(s) Performed: CANNULATED HIP PINNING (Right Hip)  Patient Location: PACU  Anesthesia Type:General  Level of Consciousness: awake, alert  and oriented  Airway & Oxygen Therapy: Patient Spontanous Breathing  Post-op Assessment: Report given to RN and Post -op Vital signs reviewed and stable  Post vital signs: Reviewed and stable  Last Vitals:  Vitals Value Taken Time  BP 130/68 01/04/20 1156  Temp    Pulse 73 01/04/20 1158  Resp 15 01/04/20 1158  SpO2 98 % 01/04/20 1158  Vitals shown include unvalidated device data.  Last Pain:  Vitals:   01/04/20 0807  TempSrc: Oral  PainSc:       Patients Stated Pain Goal: 0 (36/46/80 3212)  Complications: No complications documented.

## 2020-01-05 ENCOUNTER — Encounter: Payer: Self-pay | Admitting: Orthopedic Surgery

## 2020-01-05 DIAGNOSIS — Z7901 Long term (current) use of anticoagulants: Secondary | ICD-10-CM | POA: Diagnosis not present

## 2020-01-05 DIAGNOSIS — S72001D Fracture of unspecified part of neck of right femur, subsequent encounter for closed fracture with routine healing: Secondary | ICD-10-CM | POA: Diagnosis not present

## 2020-01-05 DIAGNOSIS — N184 Chronic kidney disease, stage 4 (severe): Secondary | ICD-10-CM | POA: Diagnosis not present

## 2020-01-05 LAB — BASIC METABOLIC PANEL
Anion gap: 8 (ref 5–15)
BUN: 29 mg/dL — ABNORMAL HIGH (ref 8–23)
CO2: 23 mmol/L (ref 22–32)
Calcium: 7.2 mg/dL — ABNORMAL LOW (ref 8.9–10.3)
Chloride: 103 mmol/L (ref 98–111)
Creatinine, Ser: 2.04 mg/dL — ABNORMAL HIGH (ref 0.44–1.00)
GFR calc Af Amer: 25 mL/min — ABNORMAL LOW (ref >60)
GFR calc non Af Amer: 22 mL/min — ABNORMAL LOW (ref >60)
Glucose, Bld: 157 mg/dL — ABNORMAL HIGH (ref 70–99)
Potassium: 5.5 mmol/L — ABNORMAL HIGH (ref 3.5–5.1)
Sodium: 134 mmol/L — ABNORMAL LOW (ref 135–145)

## 2020-01-05 LAB — CBC
HCT: 25.9 % — ABNORMAL LOW (ref 36.0–46.0)
Hemoglobin: 8.7 g/dL — ABNORMAL LOW (ref 12.0–15.0)
MCH: 31.4 pg (ref 26.0–34.0)
MCHC: 33.6 g/dL (ref 30.0–36.0)
MCV: 93.5 fL (ref 80.0–100.0)
Platelets: 175 10*3/uL (ref 150–400)
RBC: 2.77 MIL/uL — ABNORMAL LOW (ref 3.87–5.11)
RDW: 11.9 % (ref 11.5–15.5)
WBC: 9.2 10*3/uL (ref 4.0–10.5)
nRBC: 0 % (ref 0.0–0.2)

## 2020-01-05 MED ORDER — INSULIN ASPART 100 UNIT/ML IV SOLN
10.0000 [IU] | Freq: Once | INTRAVENOUS | Status: AC
  Administered 2020-01-05: 10 [IU] via INTRAVENOUS
  Filled 2020-01-05: qty 0.1

## 2020-01-05 MED ORDER — DEXTROSE 50 % IV SOLN
1.0000 | Freq: Once | INTRAVENOUS | Status: AC
  Administered 2020-01-05: 50 mL via INTRAVENOUS
  Filled 2020-01-05: qty 50

## 2020-01-05 NOTE — Plan of Care (Signed)

## 2020-01-05 NOTE — Progress Notes (Signed)
PROGRESS NOTE    Rachel Jones  PTW:[id number] DOB: 1933-06-28 DOA: 01/01/2020 PCP: Idelle Crouch, MD   Brief Narrative:  [name] a86 y.o.Caucasian femalewith a known history of coronary artery disease, GERD, hypertension, stage IV chronic kidney disease and obstructive sleep apnea, who presented to the emergency room with acute onset of accidental mechanical fall on Friday. She stumbled and got tripped up in her walker. She was having right hip and right foot pain after falling. She was not able to bear weight on her right foot. She denies any headache or dizziness or blurred vision, paresthesias or focal muscle weakness. No chest pain or dyspnea or palpitations. No head injuries. She denies any fever or chills. No dysuria, urinary frequency or urgency, oliguria or hematuria or flank pain.  Upon presentation to the emergency room, vital signs were within normal. Labs revealed a BUN of 27 creatinine 1.95 close to her baseline CBC showed anemia with hemoglobin 9.4 and hematocrit 29.5 worse than her previous levels in 2018. UA with suspicious for UTI. Head CT scan showed chronic microvascular disease and cerebral atrophy with no acute intracranial abnormalities. Right femur x-ray showed no fracture or dislocation but just osteoarthritic change most severe in the hip joint. Foot x-ray showed suspected residua of old trauma in the calcaneus with no fracture or dislocation. It showed pes planus and calcification the medial first MTP joint likely due to to calcium pyrophosphate deposition disease. It shows osteoporosis. Pelvic x-ray showed severe osteoarthritic change in the right hip joint with osteoporotic bones without fracture or dislocation. Right hip/fib x-ray showed osteoporotic bones, knee and ankle joint osteoarthritic changes with no fracture or dislocation. CT of the right hip showed asymmetric osteoarthritis in the right hip with no acute fracture or dislocation.  Right foot CT showed no fracture.   8/14: Patient seen and examined this morning.  Low-grade temperature noted overnight.  T-max 100.7.  Patient only complaint Jones pain in the left leg this morning.  Son at bedside.  Plan for operating room today with orthopedics.  8/15: Patient seen and examined.  Postop day 1 status post percutaneous picture for right femoral neck fracture.  Pain well controlled.  Daughter at bedside.  No fevers over interval.  Remains on IV Ancef.    Assessment & Plan:   Active Problems:   CKD (chronic kidney disease), stage IV (HCC)   Closed right hip fracture (HCC)   Chronic anticoagulation  Nondisplaced right femoral neck fracture-- after fall and confirmed on MRI hip Postop day 1 status post percutaneous excision Seen by orthopedics this morning Started Eliquis for VTE prophylaxis today Plan: 25% weightbearing per Ortho PT and OT Start home Eliquis  UTI  Urine culture demonstrating pansensitive Klebsiella Patient received fosfomycin in the ED.  Unclear whether this has activity against Klebsiella Day 2 of Ancef Continue 3-day treatment  Hypertension. Continue losartan and metoprolol  Acute kidney injury on stage IV chronic kidney disease Creatinine worsening over interval Hold losartan Recheck in a.m   GERD. Continue PPI  Dyslipidemia. Continue statin  DVT prophylaxis. -SCDs. Heparin drip  History of DVT lower extremity -patient has been on eliquis for more than a year. She tells me her primary care physician Dr. Doy Hutching and cardiologist Dr. Ubaldo Glassing wanted her to be on it indefinitely -pharmacy consulted for heparin bridging preoperatively.  Hyperkalemia Potassium 5.5 Insulin dextrose x1    DVT prophylaxis: Eliquis Code Status: Full Family Communication: Daughter at bedside disposition Plan: Status Jones: Inpatient  Remains inpatient  appropriate because:Inpatient level of care appropriate due to severity of illness   Dispo:  The patient Jones from: Home              Anticipated d/c Jones to: Home              Anticipated d/c date Jones: 2 days              Patient currently Jones not medically stable to d/c.        Consultants:   Orthopedic  Procedures:   Percutaneous fixation right hip fracture, 01/04/2020  Antimicrobials:   Ancef   Subjective: Seen and examined.  Pain in right leg.  No other complaints  Objective: Vitals:   01/05/20 0015 01/05/20 0408 01/05/20 0723 01/05/20 1144  BP: 137/63 131/63 138/65 (!) 117/57  Pulse: 67 64 67 75  Resp: 18 17 16 16   Temp: (!) 97.5 F (36.4 C) (!) 97.5 F (36.4 C) (!) 97.5 F (36.4 C) 97.7 F (36.5 C)  TempSrc: Oral Oral Oral Oral  SpO2: 95% 97% 98% 97%  Weight:      Height:        Intake/Output Summary (Last 24 hours) at 01/05/2020 1154 Last data filed at 01/05/2020 1046 Gross per 24 hour  Intake 792.78 ml  Output 400 ml  Net 392.78 ml   Filed Weights   01/01/20 1254 01/02/20 0237  Weight: 81.6 kg 81.6 kg    Examination:  General: No apparent distress, patient appears well HEENT: Normocephalic, atraumatic Neck, supple, trachea midline, no tenderness Heart: Regular rate and rhythm, S1/S2 normal, no murmurs Lungs: Clear to auscultation bilaterally, no adventitious sounds, normal work of breathing Abdomen: Soft, nontender, nondistended, positive bowel sounds Extremities: Right hip decreased range of motion Skin: No rashes or lesions, normal color Neurologic: Cranial nerves grossly intact, sensation intact, alert and oriented x3 Psychiatric: Normal affect    Data Reviewed: I have personally reviewed following labs and imaging studies  CBC: Recent Labs  Lab 01/01/20 1344 01/02/20 0343 01/03/20 0607 01/04/20 0430 01/05/20 0415  WBC 8.0 7.7 7.1 8.8 9.2  HGB 9.4* 9.6* 8.6* 7.7* 8.7*  HCT 29.5* 28.9* 27.0* 24.6* 25.9*  MCV 98.7 95.4 98.9 98.8 93.5  PLT 166 173 151 165 992   Basic Metabolic Panel: Recent Labs  Lab 01/01/20 1344  01/02/20 0343 01/05/20 0415  NA 140 141 134*  K 4.5 4.7 5.5*  CL 108 106 103  CO2 21* 25 23  GLUCOSE 101* 92 157*  BUN 27* 27* 29*  CREATININE 1.95* 1.74* 2.04*  CALCIUM 7.9* 8.2* 7.2*   GFR: Estimated Creatinine Clearance: 20.8 mL/min (A) (by C-G formula based on SCr of 2.04 mg/dL (H)). Liver Function Tests: No results for input(s): AST, ALT, ALKPHOS, BILITOT, PROT, ALBUMIN in the last 168 hours. No results for input(s): LIPASE, AMYLASE in the last 168 hours. No results for input(s): AMMONIA in the last 168 hours. Coagulation Profile: Recent Labs  Lab 01/02/20 1324  INR 1.2   Cardiac Enzymes: No results for input(s): CKTOTAL, CKMB, CKMBINDEX, TROPONINI in the last 168 hours. BNP (last 3 results) No results for input(s): PROBNP in the last 8760 hours. HbA1C: No results for input(s): HGBA1C in the last 72 hours. CBG: No results for input(s): GLUCAP in the last 168 hours. Lipid Profile: No results for input(s): CHOL, HDL, LDLCALC, TRIG, CHOLHDL, LDLDIRECT in the last 72 hours. Thyroid Function Tests: No results for input(s): TSH, T4TOTAL, FREET4, T3FREE, THYROIDAB in the last 72 hours. Anemia  Panel: No results for input(s): VITAMINB12, FOLATE, FERRITIN, TIBC, IRON, RETICCTPCT in the last 72 hours. Sepsis Labs: No results for input(s): PROCALCITON, LATICACIDVEN in the last 168 hours.  Recent Results (from the past 240 hour(s))  Urine Culture     Status: Abnormal   Collection Time: 01/01/20  1:44 PM   Specimen: Urine, Random  Result Value Ref Range Status   Specimen Description   Final    URINE, RANDOM Performed at [facility], [address] [place], [place] [address]    Special Requests   Final    NONE Performed at [facility] [place], [place] [address]    Culture >=100,000 COLONIES/mL KLEBSIELLA PNEUMONIAE (A)  Final   Report Status 01/03/2020 FINAL  Final   Organism ID, Bacteria KLEBSIELLA PNEUMONIAE (A)  Final       Susceptibility   Klebsiella pneumoniae - MIC*    AMPICILLIN RESISTANT Resistant     CEFAZOLIN <=4 SENSITIVE Sensitive     CEFTRIAXONE <=0.25 SENSITIVE Sensitive     CIPROFLOXACIN <=0.25 SENSITIVE Sensitive     GENTAMICIN <=1 SENSITIVE Sensitive     IMIPENEM <=0.25 SENSITIVE Sensitive     NITROFURANTOIN 32 SENSITIVE Sensitive     TRIMETH/SULFA <=20 SENSITIVE Sensitive     AMPICILLIN/SULBACTAM 4 SENSITIVE Sensitive     PIP/TAZO <=4 SENSITIVE Sensitive     * >=100,000 COLONIES/mL KLEBSIELLA PNEUMONIAE  SARS Coronavirus 2 by RT PCR (hospital order, performed in [place] hospital lab) Nasopharyngeal Nasopharyngeal Swab     Status: None   Collection Time: 01/02/20  1:03 AM   Specimen: Nasopharyngeal Swab  Result Value Ref Range Status   SARS Coronavirus 2 NEGATIVE NEGATIVE Final    Comment: (NOTE) SARS-CoV-2 target nucleic acids are NOT DETECTED.  The SARS-CoV-2 RNA Jones generally detectable in upper and lower respiratory specimens during the acute phase of infection. The lowest concentration of SARS-CoV-2 viral copies this assay can detect Jones 250 copies / mL. A negative result does not preclude SARS-CoV-2 infection and should not be used as the sole basis for treatment or other patient management decisions.  A negative result may occur with improper specimen collection / handling, submission of specimen other than nasopharyngeal swab, presence of viral mutation(s) within the areas targeted by this assay, and inadequate number of viral copies (<250 copies / mL). A negative result must be combined with clinical observations, patient history, and epidemiological information.  Fact Sheet for Patients:   StrictlyIdeas.no  Fact Sheet for Healthcare Providers: BankingDealers.co.za  This test Jones not yet approved or  cleared by the [place] FDA and has been authorized for detection and/or diagnosis of SARS-CoV-2 by FDA under an Emergency  Use Authorization (EUA).  This EUA will remain in effect (meaning this test can be used) for the duration of the COVID-19 declaration under Section 564(b)(1) of the Act, 21 U.S.C. section 360bbb-3(b)(1), unless the authorization Jones terminated or revoked sooner.  Performed at [facility], [address] [place], [place] [address]   Surgical pcr screen     Status: Abnormal   Collection Time: 01/03/20  9:55 PM   Specimen: Nasal Mucosa; Nasal Swab  Result Value Ref Range Status   MRSA, PCR POSITIVE (A) NEGATIVE Final    Comment: RESULT CALLED TO, READ BACK BY AND VERIFIED WITH: SONYA OGLESBY AT 0016 ON 01/04/20 RWW    Staphylococcus aureus POSITIVE (A) NEGATIVE Final    Comment: (NOTE) The Xpert SA Assay (FDA approved for NASAL  specimens in patients 88 years of age and older), Jones one component of a comprehensive surveillance program. It Jones not intended to diagnose infection nor to guide or monitor treatment. Performed at [facility], [address], [place], [place] [address]          Radiology Studies: DG Hip Port Unilat With Pelvis 1V Right  Result Date: 01/04/2020 CLINICAL DATA:  Right hip ORIF EXAM: DG HIP (WITH OR WITHOUT PELVIS) 1V PORT RIGHT COMPARISON:  None. FINDINGS: Generalized osteopenia. Three cannulated screws transfix the right femoral neck in satisfactory position. Severe osteoarthritis of the right hip. No acute fracture or dislocation. IMPRESSION: Interval ORIF of the right femoral neck. No acute fracture or dislocation. Electronically Signed   By: Kathreen Devoid   On: 01/04/2020 13:13   DG HIP OPERATIVE UNILAT W OR W/O PELVIS RIGHT  Result Date: 01/04/2020 CLINICAL DATA:  Right hip replacement EXAM: OPERATIVE RIGHT HIP WITH PELVIS COMPARISON:  01/01/2020 FLUOROSCOPY TIME:  Radiation Exposure Index (as provided by the fluoroscopic device): 28.8 mGy If the device does not provide the exposure index: Fluoroscopy Time:  57 seconds Number of  Acquired Images:  8 FINDINGS: Significant degenerative changes of the right hip joint are seen. Three fixation screws are noted traversing the right femoral neck. IMPRESSION: ORIF of right femoral neck. Electronically Signed   By: Inez Catalina M.D.   On: 01/04/2020 11:56        Scheduled Meds:  apixaban  2.5 mg Oral BID   calcium-vitamin D  1 tablet Oral BID   cholecalciferol  1,000 Units Oral Daily   dextrose  1 ampule Intravenous Once   docusate sodium  100 mg Oral BID   ferrous gluconate  324 mg Oral Daily   insulin aspart  10 Units Intravenous Once   metoprolol succinate  25 mg Oral Daily   mupirocin ointment  1 application Nasal BID   pantoprazole  40 mg Oral Daily   pravastatin  40 mg Oral q1800   senna  1 tablet Oral BID   tamoxifen  20 mg Oral Daily   Continuous Infusions:  sodium chloride 75 mL/hr (01/05/20 0310)    ceFAZolin (ANCEF) IV 2 g (01/05/20 1148)     LOS: 3 days    Time spent: 25 minutes    Sidney Ace, MD [facility] Pager [phone]  If 7PM-7AM, please contact night-coverage 01/05/2020, 11:54 AM

## 2020-01-05 NOTE — Evaluation (Signed)
Occupational Therapy Evaluation Patient Details Name: Rachel Jones MRN: [id number] DOB: [birth year] Today's Date: 01/05/2020    History of Present Illness Patient is an 86 y/o F that presents with R non-displaced hip fx, sx date of 01/04/20 with percutaneous pinning to manage. She has a history of bil knee and L hip pain.   Clinical Impression   Rachel Jones was seen for OT evaluation this date. Prior to hospital admission, pt was MOD I for mobility and ADLs using 4WW. Pt endorses multiple falls this year. Pt lives alone next door to family who assist PRN c IADLs. Pt presents to acute OT demonstrating impaired ADL performance and functional mobility 2/2 decreased activity tolerance, functional strength/ROM/balance deficits, pain, and decreased LB access. Pt currently requires SETUP self-feeding/drinking at bed level. MAX A don/doff B socks at bed level. MIN A adjust torso at bed level.  Pt would benefit from skilled OT to address noted impairments and functional limitations (see below for any additional details) in order to maximize safety and independence while minimizing falls risk and caregiver burden. Upon hospital discharge, recommend STR to maximize pt safety and return to PLOF.     Follow Up Recommendations  SNF    Equipment Recommendations   (TBD)    Recommendations for Other Services       Precautions / Restrictions Precautions Precautions: Fall Restrictions Weight Bearing Restrictions: Yes RLE Weight Bearing: Partial weight bearing RLE Partial Weight Bearing Percentage or Pounds: 25%      Mobility Bed Mobility Overal bed mobility: Needs Assistance       General bed mobility comments: MIN A adjust torso at bed level.   Transfers Overall transfer level: Needs assistance     General transfer comment: Not tested    Balance Overall balance assessment: History of Falls;Needs assistance            ADL either performed or assessed with clinical judgement   ADL  Overall ADL's : Needs assistance/impaired          General ADL Comments: SETUP self-feeding/drinking at bed level. MAX A don/doff B socks at bed level.       Pertinent Vitals/Pain Pain Assessment: Faces Faces Pain Scale: Hurts little more Pain Location: R knee, L knee, L hip Pain Descriptors / Indicators: Aching Pain Intervention(s): Limited activity within patient's tolerance;Monitored during session;Repositioned     Hand Dominance Right   Extremity/Trunk Assessment Upper Extremity Assessment Upper Extremity Assessment: Overall WFL for tasks assessed   Lower Extremity Assessment Lower Extremity Assessment: RLE deficits/detail;LLE deficits/detail RLE Deficits / Details: Unable to comfortably perform heel slides or SLR due to pain in the R knee. LLE Deficits / Details: Ankle DF/PF intact       Communication Communication Communication: No difficulties   Cognition Arousal/Alertness: Awake/alert Behavior During Therapy: WFL for tasks assessed/performed Overall Cognitive Status: Within Functional Limits for tasks assessed              General Comments       Exercises Exercises: Other exercises Other Exercises Other Exercises: Pt and family educated re: OT role, DME recs, d/c recs, falls prevention, functional application of PWBing pcns, HEP Other Exercises: Self-drinking, LBD, bed mobility   Shoulder Instructions      Home Living Family/patient expects to be discharged to:: Skilled nursing facility Living Arrangements: Alone Available Help at Discharge: Family;Available PRN/intermittently Type of Home: House Home Access: Ramped entrance           Bathroom Shower/Tub: Walk-in shower  Home Equipment: Wilder - 2 wheels;Walker - 4 wheels;Shower seat;Grab bars - tub/shower;Grab bars - toilet          Prior Functioning/Environment Level of Independence: Independent with assistive device(s)        Comments: Pt reports dressing and sponge  bathing seated on commode. Assist from family for IADLs. Patient had been steadily declining in mobility prior to this fall. She has had several other falls per daughter. They had been considering a nursing home for her prior to this fall.        OT Problem List: Decreased strength;Decreased range of motion;Decreased activity tolerance;Impaired balance (sitting and/or standing);Decreased safety awareness;Decreased knowledge of use of DME or AE      OT Treatment/Interventions: Self-care/ADL training;Therapeutic exercise;Energy conservation;DME and/or AE instruction;Therapeutic activities;Patient/family education;Balance training    OT Goals(Current goals can be found in the care plan section) Acute Rehab OT Goals Patient Stated Goal: To get stronger to ambulate more independently. OT Goal Formulation: With patient/family Time For Goal Achievement: 01/19/20 Potential to Achieve Goals: Good ADL Goals Pt Will Perform Grooming: sitting;with supervision Pt Will Perform Lower Body Dressing: with max assist;sit to/from stand;with caregiver independent in assisting (c AE PRN) Pt Will Transfer to Toilet: with min guard assist (rolling at bed level)  OT Frequency: Min 2X/week   Barriers to D/C: Inaccessible home environment;Decreased caregiver support          Co-evaluation              AM-PAC OT "6 Clicks" Daily Activity     Outcome Measure Help from another person eating meals?: None Help from another person taking care of personal grooming?: A Little Help from another person toileting, which includes using toliet, bedpan, or urinal?: A Lot Help from another person bathing (including washing, rinsing, drying)?: A Lot Help from another person to put on and taking off regular upper body clothing?: A Lot Help from another person to put on and taking off regular lower body clothing?: A Lot 6 Click Score: 15   End of Session    Activity Tolerance: Patient limited by pain;Patient  limited by fatigue Patient left: in bed;with call bell/phone within reach;with bed alarm set;with family/visitor present  OT Visit Diagnosis: Other abnormalities of gait and mobility (R26.89);History of falling (Z91.81);Muscle weakness (generalized) (M62.81)                Time: 5427-0623 OT Time Calculation (min): 14 min Charges:  OT General Charges $OT Visit: 1 Visit OT Evaluation $OT Eval Low Complexity: 1 Low OT Treatments $Self Care/Home Management : 8-22 mins  Dessie Coma, M.S. OTR/L  01/05/20, 3:06 PM  ascom [phone]

## 2020-01-05 NOTE — Evaluation (Signed)
Physical Therapy Evaluation Patient Details Name: Rachel Jones MRN: [id number] DOB: [birth year] Today's Date: 01/05/2020   History of Present Illness  Patient is an 86 y/o F that presents with R non-displaced hip fx, sx date of 01/04/20 with percutaneous pinning to manage. She has a history of bil knee and L hip pain.  Clinical Impression  Patient is an 86 y/o F that presents after fall at home sustaining a non-displaced femoral neck fx. She was treated operatively with percutaneous fixation on 8/14. She was declining rapidly at home, with daughter expressing multiple falls at home prior to this one and concern over her ability to ambulate safely around the house. She has a history of L hip and bilateral knee pain. She is quite limited and has notable "clunking" of the knees in heel slide attempts this date. She is not appropriate to attempt standing at this time given the [place] on her RLE and significant L knee and hip pain. She is likely appropriate for SNF placement at this time once medically stable for discharge. She was able to sit at the EOB for several minutes however required +2 max A to complete transfers.     Follow Up Recommendations SNF    Equipment Recommendations       Recommendations for Other Services       Precautions / Restrictions Precautions Precautions: Fall Restrictions Weight Bearing Restrictions: Yes RLE Weight Bearing: Partial weight bearing RLE Partial Weight Bearing Percentage or Pounds: 25% per ortho on RLE      Mobility  Bed Mobility Overal bed mobility: Needs Assistance Bed Mobility: Supine to Sit;Sit to Supine     Supine to sit: Max assist;+2 for physical assistance Sit to supine: Max assist;+2 for physical assistance   General bed mobility comments: Patient is largely too weak to complete independent hip abductions or SLRs, thus required +2 assist to complete transfer safely for all involved.  Transfers                 General transfer  comment: Deferred due to pain/weakness.  Ambulation/Gait                Stairs            Wheelchair Mobility    Modified Rankin (Stroke Patients Only)       Balance Overall balance assessment: History of Falls;Needs assistance Sitting-balance support: No upper extremity supported;Feet supported Sitting balance-Leahy Scale: Good Sitting balance - Comments: Able to complete sitting exercises without loss of balance                                     Pertinent Vitals/Pain Pain Assessment: Faces Faces Pain Scale: Hurts even more Pain Location: R knee, L knee, L hip Pain Descriptors / Indicators: Aching Pain Intervention(s): Limited activity within patient's tolerance;Monitored during session;Premedicated before session;Repositioned    Home Living Family/patient expects to be discharged to:: Skilled nursing facility Living Arrangements: Alone Available Help at Discharge: Family;Available PRN/intermittently Type of Home: House                Prior Function Level of Independence: Independent with assistive device(s)         Comments: Patient had been steadily declining in mobility prior to this fall. She has had several other falls per daughter. They had been considering a nursing home for her prior to this fall.     Hand Dominance  Extremity/Trunk Assessment   Upper Extremity Assessment Upper Extremity Assessment: Overall WFL for tasks assessed    Lower Extremity Assessment Lower Extremity Assessment: RLE deficits/detail;LLE deficits/detail RLE Deficits / Details: Unable to comfortably perform heel slides or SLR due to pain in the R knee. LLE Deficits / Details: Noted sensation of beginning to cramp, pain in the L knee with heel slides.       Communication   Communication: No difficulties  Cognition Arousal/Alertness: Awake/alert Behavior During Therapy: WFL for tasks assessed/performed Overall Cognitive Status: Within  Functional Limits for tasks assessed                                        General Comments      Exercises General Exercises - Lower Extremity Ankle Circles/Pumps: AROM;Both;10 reps Quad Sets: AROM;Both;10 reps Gluteal Sets: AROM;Both;10 reps Long Arc Quad: AROM;Both;10 reps Heel Slides:  (Deferred due to pain) Straight Leg Raises:  (Deferred due to pain) Toe Raises: AROM;Seated;Both;10 reps Heel Raises: AROM;Seated;Both;10 reps Other Exercises Other Exercises: Pushing then pulling with UEs x 15 against PT resistance.   Assessment/Plan    PT Assessment Patient needs continued PT services  PT Problem List Decreased strength;Decreased mobility;Decreased safety awareness;Decreased range of motion;Decreased knowledge of precautions;Cardiopulmonary status limiting activity;Decreased activity tolerance;Decreased balance;Decreased knowledge of use of DME;Pain       PT Treatment Interventions DME instruction;Therapeutic exercise;Balance training;Gait training;Stair training;Neuromuscular re-education;Functional mobility training;Cognitive remediation;Therapeutic activities    PT Goals (Current goals can be found in the Care Plan section)  Acute Rehab PT Goals Patient Stated Goal: To get stronger to ambulate more independently. PT Goal Formulation: With patient/family Time For Goal Achievement: 01/19/20 Potential to Achieve Goals: Fair    Frequency BID   Barriers to discharge Decreased caregiver support Patient had been living alone, now requires substantial assistance.    Co-evaluation               AM-PAC PT "6 Clicks" Mobility  Outcome Measure Help needed turning from your back to your side while in a flat bed without using bedrails?: Total Help needed moving from lying on your back to sitting on the side of a flat bed without using bedrails?: Total Help needed moving to and from a bed to a chair (including a wheelchair)?: Total Help needed standing  up from a chair using your arms (e.g., wheelchair or bedside chair)?: Total Help needed to walk in hospital room?: Total Help needed climbing 3-5 steps with a railing? : Total 6 Click Score: 6    End of Session   Activity Tolerance: Patient tolerated treatment well;Patient limited by fatigue;Patient limited by pain Patient left: in bed;with call bell/phone within reach;with bed alarm set Nurse Communication: Mobility status PT Visit Diagnosis: Repeated falls (R29.6)    Time: 7989-2119 PT Time Calculation (min) (ACUTE ONLY): 27 min   Charges:   PT Evaluation $PT Eval Moderate Complexity: 1 Mod PT Treatments $Therapeutic Exercise: 8-22 mins   Royce Macadamia PT, DPT, CSCS       01/05/2020, 2:20 PM

## 2020-01-05 NOTE — Anesthesia Postprocedure Evaluation (Signed)
Anesthesia Post Note  Patient: Rachel Jones  Procedure(s) Performed: CANNULATED HIP PINNING (Right Hip)  Patient location during evaluation: PACU Anesthesia Type: General Level of consciousness: awake and alert Pain management: pain level controlled Vital Signs Assessment: post-procedure vital signs reviewed and stable Respiratory status: spontaneous breathing, nonlabored ventilation and respiratory function stable Cardiovascular status: blood pressure returned to baseline and stable Postop Assessment: no apparent nausea or vomiting Anesthetic complications: no   No complications documented.   Last Vitals:  Vitals:   01/05/20 0408 01/05/20 0723  BP: 131/63 138/65  Pulse: 64 67  Resp: 17 16  Temp: (!) 36.4 C (!) 36.4 C  SpO2: 97% 98%    Last Pain:  Vitals:   01/05/20 0751  TempSrc:   PainSc: 2                  Alphonsus Sias

## 2020-01-05 NOTE — Progress Notes (Signed)
Subjective:  POD #1 s/p percutaneous fixation for right femoral neck hip fracture.   Patient reports right hip pain as normal.  Patient seen lying in her hospital bed.  Her daughter is at the bedside along with her nurse.  Patient has spasms in the left hip which is responding to Robaxin.  She has low potassium today and is being treated by the hospitalist with insulin.    Objective:   VITALS:   Vitals:   01/05/20 0015 01/05/20 0408 01/05/20 0723 01/05/20 1144  BP: 137/63 131/63 138/65 (!) 117/57  Pulse: 67 64 67 75  Resp: 18 17 16 16   Temp: (!) 97.5 F (36.4 C) (!) 97.5 F (36.4 C) (!) 97.5 F (36.4 C) 97.7 F (36.5 C)  TempSrc: Oral Oral Oral Oral  SpO2: 95% 97% 98% 97%  Weight:      Height:        PHYSICAL EXAM: Right lower extremity Neurovascular intact Sensation intact distally Intact pulses distally Dorsiflexion/Plantar flexion intact Incision: dressing C/D/I No cellulitis present Compartment soft  LABS  Results for orders placed or performed during the hospital encounter of 01/01/20 (from the past 24 hour(s))  CBC     Status: Abnormal   Collection Time: 01/05/20  4:15 AM  Result Value Ref Range   WBC 9.2 4.0 - 10.5 K/uL   RBC 2.77 (L) 3.87 - 5.11 MIL/uL   Hemoglobin 8.7 (L) 12.0 - 15.0 g/dL   HCT 25.9 (L) 36 - 46 %   MCV 93.5 80.0 - 100.0 fL   MCH 31.4 26.0 - 34.0 pg   MCHC 33.6 30.0 - 36.0 g/dL   RDW 11.9 11.5 - 15.5 %   Platelets 175 150 - 400 K/uL   nRBC 0.0 0.0 - 0.2 %  Basic metabolic panel     Status: Abnormal   Collection Time: 01/05/20  4:15 AM  Result Value Ref Range   Sodium 134 (L) 135 - 145 mmol/L   Potassium 5.5 (H) 3.5 - 5.1 mmol/L   Chloride 103 98 - 111 mmol/L   CO2 23 22 - 32 mmol/L   Glucose, Bld 157 (H) 70 - 99 mg/dL   BUN 29 (H) 8 - 23 mg/dL   Creatinine, Ser 2.04 (H) 0.44 - 1.00 mg/dL   Calcium 7.2 (L) 8.9 - 10.3 mg/dL   GFR calc non Af Amer 22 (L) >60 mL/min   GFR calc Af Amer 25 (L) >60 mL/min   Anion gap 8 5 - 15    DG  Hip Port Unilat With Pelvis 1V Right  Result Date: 01/04/2020 CLINICAL DATA:  Right hip ORIF EXAM: DG HIP (WITH OR WITHOUT PELVIS) 1V PORT RIGHT COMPARISON:  None. FINDINGS: Generalized osteopenia. Three cannulated screws transfix the right femoral neck in satisfactory position. Severe osteoarthritis of the right hip. No acute fracture or dislocation. IMPRESSION: Interval ORIF of the right femoral neck. No acute fracture or dislocation. Electronically Signed   By: Kathreen Devoid   On: 01/04/2020 13:13   DG HIP OPERATIVE UNILAT W OR W/O PELVIS RIGHT  Result Date: 01/04/2020 CLINICAL DATA:  Right hip replacement EXAM: OPERATIVE RIGHT HIP WITH PELVIS COMPARISON:  01/01/2020 FLUOROSCOPY TIME:  Radiation Exposure Index (as provided by the fluoroscopic device): 28.8 mGy If the device does not provide the exposure index: Fluoroscopy Time:  57 seconds Number of Acquired Images:  8 FINDINGS: Significant degenerative changes of the right hip joint are seen. Three fixation screws are noted traversing the right femoral neck. IMPRESSION:  ORIF of right femoral neck. Electronically Signed   By: Inez Catalina M.D.   On: 01/04/2020 11:56    Assessment/Plan: 1 Day Post-Op   Active Problems:   CKD (chronic kidney disease), stage IV (HCC)   Closed right hip fracture (HCC)   Chronic anticoagulation  Patient doing well from an orthopedic standpoint.  Continue with plan for physical therapy.  Patient is 25% weightbearing on the right lower extremity.  She will start her Eliquis today.    Thornton Park , MD 01/05/2020, 12:03 PM

## 2020-01-06 ENCOUNTER — Ambulatory Visit: Payer: Medicare HMO | Admitting: Oncology

## 2020-01-06 ENCOUNTER — Inpatient Hospital Stay: Payer: Medicare HMO

## 2020-01-06 DIAGNOSIS — N184 Chronic kidney disease, stage 4 (severe): Secondary | ICD-10-CM | POA: Diagnosis not present

## 2020-01-06 DIAGNOSIS — Z7901 Long term (current) use of anticoagulants: Secondary | ICD-10-CM | POA: Diagnosis not present

## 2020-01-06 DIAGNOSIS — S72001D Fracture of unspecified part of neck of right femur, subsequent encounter for closed fracture with routine healing: Secondary | ICD-10-CM | POA: Diagnosis not present

## 2020-01-06 LAB — CBC
HCT: 25 % — ABNORMAL LOW (ref 36.0–46.0)
Hemoglobin: 8.1 g/dL — ABNORMAL LOW (ref 12.0–15.0)
MCH: 31.4 pg (ref 26.0–34.0)
MCHC: 32.4 g/dL (ref 30.0–36.0)
MCV: 96.9 fL (ref 80.0–100.0)
Platelets: 224 10*3/uL (ref 150–400)
RBC: 2.58 MIL/uL — ABNORMAL LOW (ref 3.87–5.11)
RDW: 12.2 % (ref 11.5–15.5)
WBC: 11.9 10*3/uL — ABNORMAL HIGH (ref 4.0–10.5)
nRBC: 0 % (ref 0.0–0.2)

## 2020-01-06 LAB — BASIC METABOLIC PANEL
Anion gap: 10 (ref 5–15)
BUN: 32 mg/dL — ABNORMAL HIGH (ref 8–23)
CO2: 21 mmol/L — ABNORMAL LOW (ref 22–32)
Calcium: 7.4 mg/dL — ABNORMAL LOW (ref 8.9–10.3)
Chloride: 100 mmol/L (ref 98–111)
Creatinine, Ser: 2.14 mg/dL — ABNORMAL HIGH (ref 0.44–1.00)
GFR calc Af Amer: 24 mL/min — ABNORMAL LOW (ref >60)
GFR calc non Af Amer: 21 mL/min — ABNORMAL LOW (ref >60)
Glucose, Bld: 116 mg/dL — ABNORMAL HIGH (ref 70–99)
Potassium: 5.5 mmol/L — ABNORMAL HIGH (ref 3.5–5.1)
Sodium: 131 mmol/L — ABNORMAL LOW (ref 135–145)

## 2020-01-06 MED ORDER — IPRATROPIUM-ALBUTEROL 0.5-2.5 (3) MG/3ML IN SOLN: 3.0000 mL | RESPIRATORY_TRACT | Status: DC | PRN

## 2020-01-06 MED ORDER — METHOCARBAMOL 500 MG PO TABS
750.0000 mg | ORAL_TABLET | Freq: Three times a day (TID) | ORAL | Status: DC
  Administered 2020-01-06 – 2020-01-07 (×4): 750 mg via ORAL
  Filled 2020-01-06: qty 2
  Filled 2020-01-06: qty 1
  Filled 2020-01-06 (×3): qty 2

## 2020-01-06 MED ORDER — SODIUM ZIRCONIUM CYCLOSILICATE 5 G PO PACK
5.0000 g | PACK | Freq: Once | ORAL | Status: AC
  Administered 2020-01-06: 5 g via ORAL
  Filled 2020-01-06: qty 1

## 2020-01-06 MED ORDER — SIMETHICONE 80 MG PO CHEW
80.0000 mg | CHEWABLE_TABLET | Freq: Four times a day (QID) | ORAL | Status: DC | PRN
  Filled 2020-01-06: qty 1

## 2020-01-06 MED ORDER — SODIUM CHLORIDE 0.9 % IV SOLN: INTRAVENOUS | Status: DC

## 2020-01-06 NOTE — Plan of Care (Signed)
Pt c/o muscle spasm in left hip with movement. Pt asking for muscle relaxer more frequently. Problem: Education: Goal: Knowledge of General Education information will improve Description: Including pain rating scale, medication(s)/side effects and non-pharmacologic comfort measures 01/06/2020 0518 by Maricela Bo, RN Outcome: Progressing 01/06/2020 0518 by Maricela Bo, RN Outcome: Progressing   Problem: Health Behavior/Discharge Planning: Goal: Ability to manage health-related needs will improve 01/06/2020 0518 by Maricela Bo, RN Outcome: Progressing 01/06/2020 0518 by Maricela Bo, RN Outcome: Progressing   Problem: Clinical Measurements: Goal: Ability to maintain clinical measurements within normal limits will improve 01/06/2020 0518 by Maricela Bo, RN Outcome: Progressing 01/06/2020 0518 by Maricela Bo, RN Outcome: Progressing Goal: Will remain free from infection 01/06/2020 0518 by Maricela Bo, RN Outcome: Progressing 01/06/2020 0518 by Maricela Bo, RN Outcome: Progressing Goal: Diagnostic test results will improve 01/06/2020 0518 by Maricela Bo, RN Outcome: Progressing 01/06/2020 0518 by Maricela Bo, RN Outcome: Progressing Goal: Respiratory complications will improve 01/06/2020 0518 by Maricela Bo, RN Outcome: Progressing 01/06/2020 0518 by Maricela Bo, RN Outcome: Progressing Goal: Cardiovascular complication will be avoided 01/06/2020 0518 by Maricela Bo, RN Outcome: Progressing 01/06/2020 0518 by Maricela Bo, RN Outcome: Progressing   Problem: Activity: Goal: Risk for activity intolerance will decrease 01/06/2020 0518 by Maricela Bo, RN Outcome: Progressing 01/06/2020 0518 by Maricela Bo, RN Outcome: Progressing   Problem: Nutrition: Goal: Adequate nutrition will be maintained 01/06/2020 0518 by Maricela Bo, RN Outcome: Progressing 01/06/2020 0518 by Maricela Bo, RN Outcome: Progressing   Problem:  Coping: Goal: Level of anxiety will decrease 01/06/2020 0518 by Maricela Bo, RN Outcome: Progressing 01/06/2020 0518 by Maricela Bo, RN Outcome: Progressing   Problem: Elimination: Goal: Will not experience complications related to bowel motility 01/06/2020 0518 by Maricela Bo, RN Outcome: Progressing 01/06/2020 0518 by Maricela Bo, RN Outcome: Progressing Goal: Will not experience complications related to urinary retention 01/06/2020 0518 by Maricela Bo, RN Outcome: Progressing 01/06/2020 0518 by Maricela Bo, RN Outcome: Progressing   Problem: Pain Managment: Goal: General experience of comfort will improve 01/06/2020 0518 by Maricela Bo, RN Outcome: Progressing 01/06/2020 0518 by Maricela Bo, RN Outcome: Progressing   Problem: Safety: Goal: Ability to remain free from injury will improve 01/06/2020 0518 by Maricela Bo, RN Outcome: Progressing 01/06/2020 0518 by Maricela Bo, RN Outcome: Progressing   Problem: Skin Integrity: Goal: Risk for impaired skin integrity will decrease Outcome: Progressing

## 2020-01-06 NOTE — Progress Notes (Signed)
Occupational Therapy Treatment Patient Details Name: Rachel Jones MRN: [id number] DOB: 10-12-1933 Today's Date: 01/06/2020    History of present illness Patient is an 86 y/o F that presents with R non-displaced hip fx, sx date of 01/04/20 with percutaneous pinning to manage. She has a history of bil knee and L hip pain.   OT comments  Rachel Jones was seen for OT/PT co-treatment on this date. Upon arrival to room pt completing toileting at bed level c NT. Son joined following toileting for remainder of session. Pt reports improved pain mgmt this date and self-initiates sitting EOB, requires MOD A x2 to complete. Pt requires SBA face washing seated EOB, reaches across midline inside BOS w/o LOB.  MAX A don/doff slippers seated EOB. Unable to clear rear c MAX A x2 + RW, cleared rear second attempt c TOTAL A x2 - unable to achieve upright posture. SETUP self-feeding/drinking at bed level. Pt making good progress toward goals. Pt continues to benefit from skilled OT services to maximize return to PLOF and minimize risk of future falls, injury, caregiver burden, and readmission. Will continue to follow POC. Discharge recommendation remains appropriate.    Follow Up Recommendations  SNF    Equipment Recommendations   (TBD)    Recommendations for Other Services      Precautions / Restrictions Precautions Precautions: Fall Restrictions Weight Bearing Restrictions: Yes RLE Weight Bearing: Partial weight bearing RLE Partial Weight Bearing Percentage or Pounds: 25%       Mobility Bed Mobility Overal bed mobility: Needs Assistance Bed Mobility: Supine to Sit;Sit to Supine     Supine to sit: Mod assist;+2 for physical assistance Sit to supine: Max assist;+2 for physical assistance   General bed mobility comments: Initiated sitting, required MOD A x2 to complete   Transfers Overall transfer level: Needs assistance Equipment used: Rolling walker (2 wheeled) Transfers: Sit to/from Stand Sit  to Stand: Max assist;Total assist;+2 physical assistance         General transfer comment: initial trial pt unable to clear rear from bed, TOTAL A x2 clear rear seconf trial    Balance Overall balance assessment: History of Falls;Needs assistance Sitting-balance support: No upper extremity supported;Feet supported Sitting balance-Leahy Scale: Good Sitting balance - Comments: Reaching across midline inside BOS w/o LOB    Standing balance support: Bilateral upper extremity supported Standing balance-Leahy Scale: Zero           ADL either performed or assessed with clinical judgement   ADL Overall ADL's : Needs assistance/impaired          General ADL Comments: SBA face washing seated EOB. TOTAL A for perihygiene at bed level. MAX A x2 for ADL t/f. SETUP self-feeding/drinking at bed level MAX A don/doff slippers seated EOB      Cognition Arousal/Alertness: Awake/alert Behavior During Therapy: WFL for tasks assessed/performed Overall Cognitive Status: Within Functional Limits for tasks assessed          Exercises Exercises: Other exercises Other Exercises Other Exercises: Pt educated re: DME recs, d/c recs, functional application of WBing status, safe t/f technique Other Exercises: LBD, face washing, pericare, be dmobility, sup<>sit, sit<>stand, sitting/standing balance/tolerance     Pertinent Vitals/ Pain       Pain Assessment: Faces Faces Pain Scale: Hurts little more Pain Location: R knee, L knee, L hip Pain Descriptors / Indicators: Aching Pain Intervention(s): Limited activity within patient's tolerance;Monitored during session;Repositioned   Frequency  Min 2X/week        Progress Toward  Goals  OT Goals(current goals can now be found in the care plan section)  Progress towards OT goals: Progressing toward goals  Acute Rehab OT Goals Patient Stated Goal: To get stronger to ambulate more independently. OT Goal Formulation: With patient/family Time  For Goal Achievement: 01/19/20 Potential to Achieve Goals: Good ADL Goals Pt Will Perform Grooming: sitting;with supervision Pt Will Perform Lower Body Dressing: with max assist;sit to/from stand;with caregiver independent in assisting (c AE PRN) Pt Will Transfer to Toilet: with min guard assist (rolling at bed level)  Plan Discharge plan remains appropriate;Frequency remains appropriate    Co-evaluation    PT/OT/SLP Co-Evaluation/Treatment: Yes Reason for Co-Treatment: For patient/therapist safety;To address functional/ADL transfers PT goals addressed during session: Mobility/safety with mobility OT goals addressed during session: ADL's and self-care      AM-PAC OT "6 Clicks" Daily Activity     Outcome Measure   Help from another person eating meals?: None Help from another person taking care of personal grooming?: A Little Help from another person toileting, which includes using toliet, bedpan, or urinal?: A Lot Help from another person bathing (including washing, rinsing, drying)?: A Lot Help from another person to put on and taking off regular upper body clothing?: A Lot Help from another person to put on and taking off regular lower body clothing?: A Lot 6 Click Score: 15    End of Session Equipment Utilized During Treatment: Rolling walker;Gait belt  OT Visit Diagnosis: Other abnormalities of gait and mobility (R26.89);History of falling (Z91.81);Muscle weakness (generalized) (M62.81)   Activity Tolerance Patient limited by pain;Patient limited by fatigue   Patient Left in bed;with call bell/phone within reach;with bed alarm set;with family/visitor present   Nurse Communication Mobility status        Time: 2549-8264 OT Time Calculation (min): 17 min  Charges: OT General Charges $OT Visit: 1 Visit OT Treatments $Self Care/Home Management : 8-22 mins  Dessie Coma, M.S. OTR/L  01/06/20, 1:37 PM  ascom [phone]

## 2020-01-06 NOTE — Care Management Important Message (Signed)
Important Message  Patient Details  Name: Rachel Jones MRN: [id number] Date of Birth: 05-03-1934   Medicare Important Message Given:  Yes     Loann Quill 01/06/2020, 11:22 AM

## 2020-01-06 NOTE — Progress Notes (Signed)
Physical Therapy Treatment Patient Details Name: Rachel Jones MRN: [id number] DOB: Mar 08, 1934 Today's Date: 01/06/2020    History of Present Illness Patient is an 86 y/o F that presents with R non-displaced hip fx, sx date of 01/04/20 with percutaneous pinning to manage. She has a history of bil knee and L hip pain.    PT Comments    Poor tolerance of AA/PROM this pm due to pain.  Pt reports general discomfort throughout BLE and back.  Repositioned for comfort but pt did not feel she could tolerate any more activity at this time.    Will continue in AM as appropriate.     Follow Up Recommendations  SNF     Equipment Recommendations       Recommendations for Other Services       Precautions / Restrictions Precautions Precautions: Fall Restrictions Weight Bearing Restrictions: Yes RLE Weight Bearing: Partial weight bearing RLE Partial Weight Bearing Percentage or Pounds: 25%    Mobility  Bed Mobility Overal bed mobility: Needs Assistance Bed Mobility: Supine to Sit;Sit to Supine     Supine to sit: Mod assist;+2 for physical assistance Sit to supine: Max assist;+2 for physical assistance   General bed mobility comments: Initiated sitting, required MOD A x2 to complete   Transfers Overall transfer level: Needs assistance Equipment used: Rolling walker (2 wheeled) Transfers: Sit to/from Stand Sit to Stand: Max assist;Total assist;+2 physical assistance         General transfer comment: initial trial pt unable to clear rear from bed, TOTAL A x2 clear rear seconf trial  Ambulation/Gait             General Gait Details: unable   Stairs             Wheelchair Mobility    Modified Rankin (Stroke Patients Only)       Balance Overall balance assessment: History of Falls;Needs assistance Sitting-balance support: No upper extremity supported;Feet supported Sitting balance-Leahy Scale: Good Sitting balance - Comments: Reaching across midline  inside BOS w/o LOB    Standing balance support: Bilateral upper extremity supported Standing balance-Leahy Scale: Zero                              Cognition Arousal/Alertness: Awake/alert Behavior During Therapy: WFL for tasks assessed/performed Overall Cognitive Status: Within Functional Limits for tasks assessed                                        Exercises Other Exercises Other Exercises: BLE AA/PROM x 10 for ankle pumps, heel slides, ab/add and SLr - poor tolerance Other Exercises: LBD, face washing, pericare, be dmobility, sup<>sit, sit<>stand, sitting/standing balance/tolerance    General Comments        Pertinent Vitals/Pain Pain Assessment: Faces Faces Pain Scale: Hurts worst Pain Location: R knee, L knee, L hip Pain Descriptors / Indicators: Aching;Sore Pain Intervention(s): Limited activity within patient's tolerance;Monitored during session;Repositioned    Home Living                      Prior Function            PT Goals (current goals can now be found in the care plan section) Acute Rehab PT Goals Patient Stated Goal: To get stronger to ambulate more independently. Progress towards PT goals: Progressing toward  goals    Frequency    BID      PT Plan Current plan remains appropriate    Co-evaluation PT/OT/SLP Co-Evaluation/Treatment: Yes Reason for Co-Treatment: For patient/therapist safety;To address functional/ADL transfers PT goals addressed during session: Mobility/safety with mobility OT goals addressed during session: ADL's and self-care      AM-PAC PT "6 Clicks" Mobility   Outcome Measure  Help needed turning from your back to your side while in a flat bed without using bedrails?: Total Help needed moving from lying on your back to sitting on the side of a flat bed without using bedrails?: Total Help needed moving to and from a bed to a chair (including a wheelchair)?: Total Help needed  standing up from a chair using your arms (e.g., wheelchair or bedside chair)?: Total Help needed to walk in hospital room?: Total Help needed climbing 3-5 steps with a railing? : Total 6 Click Score: 6    End of Session   Activity Tolerance: Patient limited by pain Patient left: in bed;with call bell/phone within reach;with bed alarm set;with family/visitor present Nurse Communication: Mobility status PT Visit Diagnosis: Repeated falls (R29.6)     Time: 1251-1300 PT Time Calculation (min) (ACUTE ONLY): 9 min  Charges:  $Therapeutic Exercise: 8-22 mins                    Chesley Noon, PTA 01/06/20, 2:12 PM

## 2020-01-06 NOTE — Progress Notes (Signed)
PROGRESS NOTE    Rachel Jones  OEV:[id number] DOB: May 07, 1934 DOA: 01/01/2020 PCP: Idelle Crouch, MD   Brief Narrative:  [name] a86 y.o.Caucasian femalewith a known history of coronary artery disease, GERD, hypertension, stage IV chronic kidney disease and obstructive sleep apnea, who presented to the emergency room with acute onset of accidental mechanical fall on Friday. She stumbled and got tripped up in her walker. She was having right hip and right foot pain after falling. She was not able to bear weight on her right foot. She denies any headache or dizziness or blurred vision, paresthesias or focal muscle weakness. No chest pain or dyspnea or palpitations. No head injuries. She denies any fever or chills. No dysuria, urinary frequency or urgency, oliguria or hematuria or flank pain.  Upon presentation to the emergency room, vital signs were within normal. Labs revealed a BUN of 27 creatinine 1.95 close to her baseline CBC showed anemia with hemoglobin 9.4 and hematocrit 29.5 worse than her previous levels in 2018. UA with suspicious for UTI. Head CT scan showed chronic microvascular disease and cerebral atrophy with no acute intracranial abnormalities. Right femur x-ray showed no fracture or dislocation but just osteoarthritic change most severe in the hip joint. Foot x-ray showed suspected residua of old trauma in the calcaneus with no fracture or dislocation. It showed pes planus and calcification the medial first MTP joint likely due to to calcium pyrophosphate deposition disease. It shows osteoporosis. Pelvic x-ray showed severe osteoarthritic change in the right hip joint with osteoporotic bones without fracture or dislocation. Right hip/fib x-ray showed osteoporotic bones, knee and ankle joint osteoarthritic changes with no fracture or dislocation. CT of the right hip showed asymmetric osteoarthritis in the right hip with no acute fracture or dislocation.  Right foot CT showed no fracture.   8/14: Patient seen and examined this morning.  Low-grade temperature noted overnight.  T-max 100.7.  Patient only complaint is pain in the left leg this morning.  Son at bedside.  Plan for operating room today with orthopedics.  8/15: Patient seen and examined.  Postop day 1 status post percutaneous fixture for right femoral neck fracture.  Pain well controlled.  Daughter at bedside.  No fevers over interval.  Remains on IV Ancef.  8/16: Patient seen and examined.  Postop day 2 2 status post percutaneous fixture of right femoral neck fracture.  Continues to have cramping pain.  Responsive to Robaxin.  Son at bedside this morning.  No fevers noted over interval.  Completing IV Ancef today.  Remains hyperkalemic to 5.5.  Unresponsive to insulin/D50 yesterday.  Noted some wheezing on exam today.  No oxygen requirement    Assessment & Plan:   Active Problems:   CKD (chronic kidney disease), stage IV (HCC)   Closed right hip fracture (HCC)   Chronic anticoagulation  Nondisplaced right femoral neck fracture-- after fall and confirmed on MRI hip Postop day 2 status post percutaneous excision Seen by orthopedics this morning Started Eliquis for VTE prophylaxis today Plan: 25% weightbearing per Ortho PT and OT- rec SNF TOC consulted Eliquis for anticoagulation Multimodal pain control  UTI  Urine culture demonstrating pansensitive Klebsiella Patient received fosfomycin in the ED.  Unclear whether this has activity against Klebsiella Day 3 of Ancef DC after today's dose  Hypertension. Continue losartan and metoprolol  Acute kidney injury on stage IV chronic kidney disease Creatinine worsening over interval Continue to hold losartan Hold nephrotoxins   GERD. Continue PPI  Dyslipidemia. Continue statin  DVT prophylaxis. -SCDs. Heparin drip  History of DVT lower extremity -patient has been on eliquis for more than a year. She tells  me her primary care physician Dr. Doy Hutching and cardiologist Dr. Ubaldo Glassing wanted her to be on it indefinitely -Heparin discontinued.  Now back on Eliquis  Hyperkalemia Potassium 5.5 Insulin dextrose x1 on 01/05/2020, ineffective Lokelma 5 g x 1 today Recheck potassium in the morning    DVT prophylaxis: Eliquis Code Status: Full Family Communication: Son at bedside disposition Plan: Status is: Inpatient  Remains inpatient appropriate because:Inpatient level of care appropriate due to severity of illness   Dispo: The patient is from: Home              Anticipated d/c is to: Home              Anticipated d/c date is: 2 days              Patient currently is not medically stable to d/c.  Patient currently postop day 2 status post surgical management of right hip fracture.  Still some cramping pain.  PT and OT recommended skilled nursing facility.  TOC consulted.  Disposition pending SNF bed offers.   Consultants:   Orthopedic  Procedures:   Percutaneous fixation right hip fracture, 01/04/2020  Antimicrobials:   Ancef   Subjective: Seen and examined.  Cramping pain bilateral lower extremities.  Some end expiratory wheeze Objective: Vitals:   01/05/20 1144 01/05/20 1526 01/05/20 2238 01/06/20 0729  BP: (!) 117/57 (!) 108/45 (!) 155/67 (!) 151/66  Pulse: 75 72 89 80  Resp: 16 15 16 18   Temp: 97.7 F (36.5 C) 97.7 F (36.5 C) 97.6 F (36.4 C) 98.3 F (36.8 C)  TempSrc: Oral Oral Oral Oral  SpO2: 97% 99% 99% 100%  Weight:      Height:        Intake/Output Summary (Last 24 hours) at 01/06/2020 1023 Last data filed at 01/05/2020 1843 Gross per 24 hour  Intake 720 ml  Output --  Net 720 ml   Filed Weights   01/01/20 1254 01/02/20 0237  Weight: 81.6 kg 81.6 kg    Examination:  General: No apparent distress, patient appears well HEENT: Normocephalic, atraumatic Neck, supple, trachea midline, no tenderness Heart: Regular rate and rhythm, S1/S2 normal, no  murmurs Lungs: Mild end expiratory wheeze.  No other adventitious sounds.  Normal work of breathing.  Room air Abdomen: Soft, nontender, nondistended, positive bowel sounds Extremities: Hip tender to touch.  No joint deformity. Skin: No rashes or lesions, normal color Neurologic: Cranial nerves grossly intact, sensation intact, alert and oriented x3 Psychiatric: Normal affect     Data Reviewed: I have personally reviewed following labs and imaging studies  CBC: Recent Labs  Lab 01/02/20 0343 01/03/20 0607 01/04/20 0430 01/05/20 0415 01/06/20 0235  WBC 7.7 7.1 8.8 9.2 11.9*  HGB 9.6* 8.6* 7.7* 8.7* 8.1*  HCT 28.9* 27.0* 24.6* 25.9* 25.0*  MCV 95.4 98.9 98.8 93.5 96.9  PLT 173 151 165 175 628   Basic Metabolic Panel: Recent Labs  Lab 01/01/20 1344 01/02/20 0343 01/05/20 0415 01/06/20 0235  NA 140 141 134* 131*  K 4.5 4.7 5.5* 5.5*  CL 108 106 103 100  CO2 21* 25 23 21*  GLUCOSE 101* 92 157* 116*  BUN 27* 27* 29* 32*  CREATININE 1.95* 1.74* 2.04* 2.14*  CALCIUM 7.9* 8.2* 7.2* 7.4*   GFR: Estimated Creatinine Clearance: 19.8 mL/min (A) (by C-G formula based on SCr of  2.14 mg/dL (H)). Liver Function Tests: No results for input(s): AST, ALT, ALKPHOS, BILITOT, PROT, ALBUMIN in the last 168 hours. No results for input(s): LIPASE, AMYLASE in the last 168 hours. No results for input(s): AMMONIA in the last 168 hours. Coagulation Profile: Recent Labs  Lab 01/02/20 1324  INR 1.2   Cardiac Enzymes: No results for input(s): CKTOTAL, CKMB, CKMBINDEX, TROPONINI in the last 168 hours. BNP (last 3 results) No results for input(s): PROBNP in the last 8760 hours. HbA1C: No results for input(s): HGBA1C in the last 72 hours. CBG: No results for input(s): GLUCAP in the last 168 hours. Lipid Profile: No results for input(s): CHOL, HDL, LDLCALC, TRIG, CHOLHDL, LDLDIRECT in the last 72 hours. Thyroid Function Tests: No results for input(s): TSH, T4TOTAL, FREET4, T3FREE,  THYROIDAB in the last 72 hours. Anemia Panel: No results for input(s): VITAMINB12, FOLATE, FERRITIN, TIBC, IRON, RETICCTPCT in the last 72 hours. Sepsis Labs: No results for input(s): PROCALCITON, LATICACIDVEN in the last 168 hours.  Recent Results (from the past 240 hour(s))  Urine Culture     Status: Abnormal   Collection Time: 01/01/20  1:44 PM   Specimen: Urine, Random  Result Value Ref Range Status   Specimen Description   Final    URINE, RANDOM Performed at [facility], [address] [place], [place] [address]    Special Requests   Final    NONE Performed at [facility], [place], [place] [address]    Culture >=100,000 COLONIES/mL KLEBSIELLA PNEUMONIAE (A)  Final   Report Status 01/03/2020 FINAL  Final   Organism ID, Bacteria KLEBSIELLA PNEUMONIAE (A)  Final      Susceptibility   Klebsiella pneumoniae - MIC*    AMPICILLIN RESISTANT Resistant     CEFAZOLIN <=4 SENSITIVE Sensitive     CEFTRIAXONE <=0.25 SENSITIVE Sensitive     CIPROFLOXACIN <=0.25 SENSITIVE Sensitive     GENTAMICIN <=1 SENSITIVE Sensitive     IMIPENEM <=0.25 SENSITIVE Sensitive     NITROFURANTOIN 32 SENSITIVE Sensitive     TRIMETH/SULFA <=20 SENSITIVE Sensitive     AMPICILLIN/SULBACTAM 4 SENSITIVE Sensitive     PIP/TAZO <=4 SENSITIVE Sensitive     * >=100,000 COLONIES/mL KLEBSIELLA PNEUMONIAE  SARS Coronavirus 2 by RT PCR (hospital order, performed in [place] hospital lab) Nasopharyngeal Nasopharyngeal Swab     Status: None   Collection Time: 01/02/20  1:03 AM   Specimen: Nasopharyngeal Swab  Result Value Ref Range Status   SARS Coronavirus 2 NEGATIVE NEGATIVE Final    Comment: (NOTE) SARS-CoV-2 target nucleic acids are NOT DETECTED.  The SARS-CoV-2 RNA is generally detectable in upper and lower respiratory specimens during the acute phase of infection. The lowest concentration of SARS-CoV-2 viral copies this assay can detect is 250 copies / mL. A negative  result does not preclude SARS-CoV-2 infection and should not be used as the sole basis for treatment or other patient management decisions.  A negative result may occur with improper specimen collection / handling, submission of specimen other than nasopharyngeal swab, presence of viral mutation(s) within the areas targeted by this assay, and inadequate number of viral copies (<250 copies / mL). A negative result must be combined with clinical observations, patient history, and epidemiological information.  Fact Sheet for Patients:   StrictlyIdeas.no  Fact Sheet for Healthcare Providers: BankingDealers.co.za  This test is not yet approved or  cleared by the [place] FDA and has been authorized for detection and/or diagnosis of SARS-CoV-2  by FDA under an Emergency Use Authorization (EUA).  This EUA will remain in effect (meaning this test can be used) for the duration of the COVID-19 declaration under Section 564(b)(1) of the Act, 21 U.S.C. section 360bbb-3(b)(1), unless the authorization is terminated or revoked sooner.  Performed at [facility], [address] [place],  [address]   Surgical pcr screen     Status: Abnormal   Collection Time: 01/03/20  9:55 PM   Specimen: Nasal Mucosa; Nasal Swab  Result Value Ref Range Status   MRSA, PCR POSITIVE (A) NEGATIVE Final    Comment: RESULT CALLED TO, READ BACK BY AND VERIFIED WITH: SONYA OGLESBY AT 0016 ON 01/04/20 RWW    Staphylococcus aureus POSITIVE (A) NEGATIVE Final    Comment: (NOTE) The Xpert SA Assay (FDA approved for NASAL specimens in patients 37 years of age and older), is one component of a comprehensive surveillance program. It is not intended to diagnose infection nor to guide or monitor treatment. Performed at [facility], [address] [place],  [address]          Radiology Studies: DG Hip Port Unilat With Pelvis 1V  Right  Result Date: 01/04/2020 CLINICAL DATA:  Right hip ORIF EXAM: DG HIP (WITH OR WITHOUT PELVIS) 1V PORT RIGHT COMPARISON:  None. FINDINGS: Generalized osteopenia. Three cannulated screws transfix the right femoral neck in satisfactory position. Severe osteoarthritis of the right hip. No acute fracture or dislocation. IMPRESSION: Interval ORIF of the right femoral neck. No acute fracture or dislocation. Electronically Signed   By: Kathreen Devoid   On: 01/04/2020 13:13   DG HIP OPERATIVE UNILAT W OR W/O PELVIS RIGHT  Result Date: 01/04/2020 CLINICAL DATA:  Right hip replacement EXAM: OPERATIVE RIGHT HIP WITH PELVIS COMPARISON:  01/01/2020 FLUOROSCOPY TIME:  Radiation Exposure Index (as provided by the fluoroscopic device): 28.8 mGy If the device does not provide the exposure index: Fluoroscopy Time:  57 seconds Number of Acquired Images:  8 FINDINGS: Significant degenerative changes of the right hip joint are seen. Three fixation screws are noted traversing the right femoral neck. IMPRESSION: ORIF of right femoral neck. Electronically Signed   By: Inez Catalina M.D.   On: 01/04/2020 11:56        Scheduled Meds: . apixaban  2.5 mg Oral BID  . calcium-vitamin D  1 tablet Oral BID  . cholecalciferol  1,000 Units Oral Daily  . docusate sodium  100 mg Oral BID  . ferrous gluconate  324 mg Oral Daily  . methocarbamol  750 mg Oral TID  . metoprolol succinate  25 mg Oral Daily  . mupirocin ointment  1 application Nasal BID  . pantoprazole  40 mg Oral Daily  . pravastatin  40 mg Oral q1800  . senna  1 tablet Oral BID  . tamoxifen  20 mg Oral Daily   Continuous Infusions: . sodium chloride 75 mL/hr (01/05/20 0310)  .  ceFAZolin (ANCEF) IV 2 g (01/05/20 2250)     LOS: 4 days    Time spent: 25 minutes    Sidney Ace, MD [facility] Pager [phone]  If 7PM-7AM, please contact night-coverage 01/06/2020, 10:23 AM

## 2020-01-06 NOTE — Progress Notes (Signed)
  Subjective:  POD #2 s/p percutaneous fixation for right femoral neck hip fracture.   Patient reports right hip pain as moderate.  Patient states she is having trouble getting comfortable.  She continues to have muscle spasms and has been written out for Robaxin to 8 hours round-the-clock.  Her son is at the bedside.  Objective:   VITALS:   Vitals:   01/05/20 1144 01/05/20 1526 01/05/20 2238 01/06/20 0729  BP: (!) 117/57 (!) 108/45 (!) 155/67 (!) 151/66  Pulse: 75 72 89 80  Resp: 16 15 16 18   Temp: 97.7 F (36.5 C) 97.7 F (36.5 C) 97.6 F (36.4 C) 98.3 F (36.8 C)  TempSrc: Oral Oral Oral Oral  SpO2: 97% 99% 99% 100%  Weight:      Height:        PHYSICAL EXAM: Right lower extremity Neurovascular intact Sensation intact distally Intact pulses distally Dorsiflexion/Plantar flexion intact Incision: dressing C/D/I No cellulitis present Compartment soft  LABS  Results for orders placed or performed during the hospital encounter of 01/01/20 (from the past 24 hour(s))  CBC     Status: Abnormal   Collection Time: 01/06/20  2:35 AM  Result Value Ref Range   WBC 11.9 (H) 4.0 - 10.5 K/uL   RBC 2.58 (L) 3.87 - 5.11 MIL/uL   Hemoglobin 8.1 (L) 12.0 - 15.0 g/dL   HCT 25.0 (L) 36 - 46 %   MCV 96.9 80.0 - 100.0 fL   MCH 31.4 26.0 - 34.0 pg   MCHC 32.4 30.0 - 36.0 g/dL   RDW 12.2 11.5 - 15.5 %   Platelets 224 150 - 400 K/uL   nRBC 0.0 0.0 - 0.2 %  Basic metabolic panel     Status: Abnormal   Collection Time: 01/06/20  2:35 AM  Result Value Ref Range   Sodium 131 (L) 135 - 145 mmol/L   Potassium 5.5 (H) 3.5 - 5.1 mmol/L   Chloride 100 98 - 111 mmol/L   CO2 21 (L) 22 - 32 mmol/L   Glucose, Bld 116 (H) 70 - 99 mg/dL   BUN 32 (H) 8 - 23 mg/dL   Creatinine, Ser 2.14 (H) 0.44 - 1.00 mg/dL   Calcium 7.4 (L) 8.9 - 10.3 mg/dL   GFR calc non Af Amer 21 (L) >60 mL/min   GFR calc Af Amer 24 (L) >60 mL/min   Anion gap 10 5 - 15    DG Chest Port 1 View  Result Date:  01/06/2020 CLINICAL DATA:  Cough EXAM: PORTABLE CHEST 1 VIEW COMPARISON:  June 14, 2007 FINDINGS: There is no edema or airspace opacity. Heart size and pulmonary vascularity are normal. There is a hiatal type hernia. There is aortic atherosclerosis. No adenopathy. No bone lesions. IMPRESSION: Hiatal type hernia. No edema or airspace opacity. Heart size normal. Aortic Atherosclerosis (ICD10-I70.0). Electronically Signed   By: Lowella Grip III M.D.   On: 01/06/2020 10:25    Assessment/Plan: 2 Days Post-Op   Active Problems:   CKD (chronic kidney disease), stage IV (HCC)   Closed right hip fracture (HCC)   Chronic anticoagulation  Patient will continue with physical therapy as tolerated.  She is 25% weightbearing on the right lower extremity.  If deemed safe by PT, the patient would probably benefit from getting up out of bed to a chair for a while.  Patient is on Eliquis.    Thornton Park , MD 01/06/2020, 12:55 PM

## 2020-01-06 NOTE — TOC Initial Note (Signed)
Transition of Care [facility]) - Initial/Assessment Note    Patient Details  Name: Rachel Jones MRN: [id number] Date of Birth: 02/11/1934  Transition of [facility]) CM/SW Contact:    Shelbie Ammons, RN Phone Number: 01/06/2020, 2:10 PM  Clinical Narrative:     RNCM received call from [facility] with [employer], she has gotten authorization and will have in place for 5 days. MD is aware                    Patient Goals and CMS Choice        Expected Discharge Plan and Services                                                Prior Living Arrangements/Services                       Activities of Daily Living Home Assistive Devices/Equipment: None ADL Screening (condition at time of admission) Patient's cognitive ability adequate to safely complete daily activities?: Yes Is the patient deaf or have difficulty hearing?: No Does the patient have difficulty seeing, even when wearing glasses/contacts?: No Does the patient have difficulty concentrating, remembering, or making decisions?: No Patient able to express need for assistance with ADLs?: Yes Does the patient have difficulty dressing or bathing?: No Independently performs ADLs?: Yes (appropriate for developmental age) Does the patient have difficulty walking or climbing stairs?: Yes Weakness of Legs: Right Weakness of Arms/Hands: None  Permission Sought/Granted                  Emotional Assessment              Admission diagnosis:  Right hip pain [M25.551] Closed right hip fracture ([place]) [S72.001A] Acute right ankle pain [M25.571] Patient Active Problem List   Diagnosis Date Noted  . Closed right hip fracture ([place]) 01/02/2020  . Chronic anticoagulation   . Chronic deep vein thrombosis (DVT) of proximal vein of lower extremity ([place]) 02/06/2019  . B12 deficiency 10/23/2017  . Hydronephrosis 11/18/2016  . Gout 11/18/2016  . Cervical cancer ([place]) 11/18/2016  . Carotid stenosis  04/04/2016  . Primary cancer of upper inner quadrant of right female breast ([place]) 04/04/2016  . Lymphedema 04/04/2016  . Chronic venous insufficiency 04/04/2016  . Chest tightness 02/15/2016  . Obstructive sleep apnea syndrome 08/13/2015  . SOB (shortness of breath) 12/18/2014  . Chronic fatigue 07/09/2014  . CKD (chronic kidney disease), stage IV ([place]) 05/09/2014  . Atypical chest pain 05/09/2014  . Chronic tension-type headache, intractable 03/06/2014  . Headache 02/06/2014  . Facial numbness 02/06/2014  . Rheumatoid arthritis involving multiple sites with positive rheumatoid factor ([place]) 11/01/2013  . Peripheral edema 11/01/2013  . Post herpetic neuralgia 11/01/2013  . Hyperlipidemia 11/01/2013  . HTN (hypertension) 11/01/2013  . History of recurrent UTIs 11/01/2013  . H/O: hysterectomy 11/01/2013  . GERD (gastroesophageal reflux disease) 11/01/2013  . Chronic headache 11/01/2013  . Anemia 11/01/2013   PCP:  Idelle Crouch, MD Pharmacy:   [facility] [phone] [address], [address] [place] [address] Phone: [phone] Fax: [phone]     Social Determinants of Health (SDOH) Interventions    Readmission Risk Interventions Readmission Risk Prevention Plan 01/03/2020  Transportation Screening Complete  PCP or Specialist Appt within 3-5 Days Complete  HRI or Home Care Consult Complete  Social Work Consult for [place] Planning/Counseling Complete  Palliative Care Screening Not Applicable  Medication Review Press photographer) Complete  Some recent data might be hidden

## 2020-01-06 NOTE — Progress Notes (Signed)
Physical Therapy Treatment Patient Details Name: Rachel Jones MRN: [id number] DOB: [birth year] Today's Date: 01/06/2020    History of Present Illness Patient is an 86 y/o F that presents with R non-displaced hip fx, sx date of 01/04/20 with percutaneous pinning to manage. She has a history of bil knee and L hip pain.    PT Comments    Co-tx with OT.  Only 1 unit.  OT billed unit, deferred PT billing per protocol.  To EOB with good effort by pt but she does need mod a x 2 to complete.  Sitting with supervision. She is able to attempt standing x 2.  On first attempt unable but on second she is able to clear hips but not fully stand.  She overall tolerated well. Some SOB and tightness noted with breathing but sats remain in upper 90's on room air.  Returned to supine after session.  Son in room for session.  Pt needed cues to remember 25% WB status.     Follow Up Recommendations  SNF     Equipment Recommendations       Recommendations for Other Services       Precautions / Restrictions Precautions Precautions: Fall Restrictions Weight Bearing Restrictions: Yes RLE Weight Bearing: Partial weight bearing RLE Partial Weight Bearing Percentage or Pounds: 25%    Mobility  Bed Mobility Overal bed mobility: Needs Assistance Bed Mobility: Supine to Sit;Sit to Supine     Supine to sit: +2 for physical assistance;Mod assist Sit to supine: Max assist;+2 for physical assistance   General bed mobility comments: good effort by pt but needs assist to complete  Transfers Overall transfer level: Needs assistance Equipment used: Rolling walker (2 wheeled) Transfers: Sit to/from Stand Sit to Stand: Max assist;Total assist;+2 physical assistance         General transfer comment: 2 attempts.  unable to clear hips first attempt, second partial stand  Ambulation/Gait             General Gait Details: unable   Stairs             Wheelchair Mobility    Modified Rankin  (Stroke Patients Only)       Balance Overall balance assessment: History of Falls;Needs assistance Sitting-balance support: No upper extremity supported;Feet supported Sitting balance-Leahy Scale: Good     Standing balance support: Bilateral upper extremity supported Standing balance-Leahy Scale: Zero                              Cognition Arousal/Alertness: Awake/alert Behavior During Therapy: WFL for tasks assessed/performed Overall Cognitive Status: Within Functional Limits for tasks assessed                                        Exercises      General Comments        Pertinent Vitals/Pain Pain Assessment: Faces Faces Pain Scale: Hurts little more Pain Location: R knee, L knee, L hip Pain Descriptors / Indicators: Aching Pain Intervention(s): Limited activity within patient's tolerance;Monitored during session;Premedicated before session    Home Living                      Prior Function            PT Goals (current goals can now be found in the care plan  section) Progress towards PT goals: Progressing toward goals    Frequency    BID      PT Plan      Co-evaluation PT/OT/SLP Co-Evaluation/Treatment: Yes Reason for Co-Treatment: For patient/therapist safety PT goals addressed during session: Mobility/safety with mobility OT goals addressed during session: ADL's and self-care      AM-PAC PT "6 Clicks" Mobility   Outcome Measure  Help needed turning from your back to your side while in a flat bed without using bedrails?: Total Help needed moving from lying on your back to sitting on the side of a flat bed without using bedrails?: Total Help needed moving to and from a bed to a chair (including a wheelchair)?: Total Help needed standing up from a chair using your arms (e.g., wheelchair or bedside chair)?: Total Help needed to walk in hospital room?: Total Help needed climbing 3-5 steps with a railing? :  Total 6 Click Score: 6    End of Session   Activity Tolerance: Patient tolerated treatment well;Patient limited by fatigue;Patient limited by pain Patient left: in bed;with call bell/phone within reach;with bed alarm set Nurse Communication: Mobility status PT Visit Diagnosis: Repeated falls (R29.6)     Time:  -     Charges:                       Chesley Noon, PTA 01/06/20, 12:36 PM

## 2020-01-07 DIAGNOSIS — H40003 Preglaucoma, unspecified, bilateral: Secondary | ICD-10-CM | POA: Diagnosis not present

## 2020-01-07 DIAGNOSIS — S72044D Nondisplaced fracture of base of neck of right femur, subsequent encounter for closed fracture with routine healing: Secondary | ICD-10-CM | POA: Diagnosis not present

## 2020-01-07 DIAGNOSIS — I825Y1 Chronic embolism and thrombosis of unspecified deep veins of right proximal lower extremity: Secondary | ICD-10-CM | POA: Diagnosis not present

## 2020-01-07 DIAGNOSIS — M2550 Pain in unspecified joint: Secondary | ICD-10-CM | POA: Diagnosis not present

## 2020-01-07 DIAGNOSIS — W19XXXD Unspecified fall, subsequent encounter: Secondary | ICD-10-CM | POA: Diagnosis not present

## 2020-01-07 DIAGNOSIS — I2510 Atherosclerotic heart disease of native coronary artery without angina pectoris: Secondary | ICD-10-CM | POA: Diagnosis not present

## 2020-01-07 DIAGNOSIS — E039 Hypothyroidism, unspecified: Secondary | ICD-10-CM | POA: Diagnosis not present

## 2020-01-07 DIAGNOSIS — C50911 Malignant neoplasm of unspecified site of right female breast: Secondary | ICD-10-CM | POA: Diagnosis not present

## 2020-01-07 DIAGNOSIS — I10 Essential (primary) hypertension: Secondary | ICD-10-CM | POA: Diagnosis not present

## 2020-01-07 DIAGNOSIS — M810 Age-related osteoporosis without current pathological fracture: Secondary | ICD-10-CM | POA: Diagnosis not present

## 2020-01-07 DIAGNOSIS — M1611 Unilateral primary osteoarthritis, right hip: Secondary | ICD-10-CM | POA: Diagnosis not present

## 2020-01-07 DIAGNOSIS — S72001A Fracture of unspecified part of neck of right femur, initial encounter for closed fracture: Secondary | ICD-10-CM | POA: Diagnosis not present

## 2020-01-07 DIAGNOSIS — M8000XD Age-related osteoporosis with current pathological fracture, unspecified site, subsequent encounter for fracture with routine healing: Secondary | ICD-10-CM | POA: Diagnosis not present

## 2020-01-07 DIAGNOSIS — R1312 Dysphagia, oropharyngeal phase: Secondary | ICD-10-CM | POA: Diagnosis not present

## 2020-01-07 DIAGNOSIS — Z7901 Long term (current) use of anticoagulants: Secondary | ICD-10-CM | POA: Diagnosis not present

## 2020-01-07 DIAGNOSIS — S72034A Nondisplaced midcervical fracture of right femur, initial encounter for closed fracture: Secondary | ICD-10-CM | POA: Diagnosis not present

## 2020-01-07 DIAGNOSIS — N184 Chronic kidney disease, stage 4 (severe): Secondary | ICD-10-CM | POA: Diagnosis not present

## 2020-01-07 DIAGNOSIS — R52 Pain, unspecified: Secondary | ICD-10-CM | POA: Diagnosis not present

## 2020-01-07 DIAGNOSIS — Z7401 Bed confinement status: Secondary | ICD-10-CM | POA: Diagnosis not present

## 2020-01-07 DIAGNOSIS — H34831 Tributary (branch) retinal vein occlusion, right eye, with macular edema: Secondary | ICD-10-CM | POA: Diagnosis not present

## 2020-01-07 DIAGNOSIS — D649 Anemia, unspecified: Secondary | ICD-10-CM | POA: Diagnosis not present

## 2020-01-07 DIAGNOSIS — S72001D Fracture of unspecified part of neck of right femur, subsequent encounter for closed fracture with routine healing: Secondary | ICD-10-CM | POA: Diagnosis not present

## 2020-01-07 DIAGNOSIS — I129 Hypertensive chronic kidney disease with stage 1 through stage 4 chronic kidney disease, or unspecified chronic kidney disease: Secondary | ICD-10-CM | POA: Diagnosis not present

## 2020-01-07 DIAGNOSIS — W19XXXA Unspecified fall, initial encounter: Secondary | ICD-10-CM | POA: Diagnosis not present

## 2020-01-07 LAB — CBC
HCT: 23 % — ABNORMAL LOW (ref 36.0–46.0)
Hemoglobin: 7.4 g/dL — ABNORMAL LOW (ref 12.0–15.0)
MCH: 31.1 pg (ref 26.0–34.0)
MCHC: 32.2 g/dL (ref 30.0–36.0)
MCV: 96.6 fL (ref 80.0–100.0)
Platelets: 222 10*3/uL (ref 150–400)
RBC: 2.38 MIL/uL — ABNORMAL LOW (ref 3.87–5.11)
RDW: 12.5 % (ref 11.5–15.5)
WBC: 7.7 10*3/uL (ref 4.0–10.5)
nRBC: 0 % (ref 0.0–0.2)

## 2020-01-07 LAB — BASIC METABOLIC PANEL
Anion gap: 7 (ref 5–15)
BUN: 32 mg/dL — ABNORMAL HIGH (ref 8–23)
CO2: 22 mmol/L (ref 22–32)
Calcium: 7 mg/dL — ABNORMAL LOW (ref 8.9–10.3)
Chloride: 104 mmol/L (ref 98–111)
Creatinine, Ser: 1.83 mg/dL — ABNORMAL HIGH (ref 0.44–1.00)
GFR calc Af Amer: 29 mL/min — ABNORMAL LOW (ref >60)
GFR calc non Af Amer: 25 mL/min — ABNORMAL LOW (ref >60)
Glucose, Bld: 88 mg/dL (ref 70–99)
Potassium: 4.7 mmol/L (ref 3.5–5.1)
Sodium: 133 mmol/L — ABNORMAL LOW (ref 135–145)

## 2020-01-07 MED ORDER — SIMETHICONE 80 MG PO CHEW: 80.0000 mg | CHEWABLE_TABLET | Freq: Four times a day (QID) | ORAL | 0 refills | Status: DC | PRN

## 2020-01-07 MED ORDER — METHOCARBAMOL 750 MG PO TABS: 750.0000 mg | ORAL_TABLET | Freq: Three times a day (TID) | ORAL | Status: DC

## 2020-01-07 MED ORDER — ALUM & MAG HYDROXIDE-SIMETH 200-200-20 MG/5ML PO SUSP: 30.0000 mL | ORAL | 0 refills | Status: DC | PRN

## 2020-01-07 NOTE — Discharge Summary (Signed)
Physician Discharge Summary  Rachel Jones:[id number] DOB: 1933/06/06 DOA: 01/01/2020  PCP: Idelle Crouch, MD  Admit date: 01/01/2020 Discharge date: 01/07/2020  Admitted From: Home Disposition: SNF  Recommendations for Outpatient Follow-up:  1. Follow up with PCP in 1 weeks 2. Follow-up orthopedics in 2 weeks  Home Health: No Equipment/Devices: None Discharge Condition: Stable CODE STATUS: Full Diet recommendation: Heart Healthy / Carb Modified Brief/Interim Summary:[name] a86 y.o.Caucasian femalewith a known history of coronary artery disease, GERD, hypertension, stage IV chronic kidney disease and obstructive sleep apnea, who presented to the emergency room with acute onset of accidental mechanical fall on Friday. She stumbled and got tripped up in her walker. She was having right hip and right foot pain after falling. She was not able to bear weight on her right foot. She denies any headache or dizziness or blurred vision, paresthesias or focal muscle weakness. No chest pain or dyspnea or palpitations. No head injuries. She denies any fever or chills. No dysuria, urinary frequency or urgency, oliguria or hematuria or flank pain.  Upon presentation to the emergency room, vital signs were within normal. Labs revealed a BUN of 27 creatinine 1.95 close to her baseline CBC showed anemia with hemoglobin 9.4 and hematocrit 29.5 worse than her previous levels in 2018. UA with suspicious for UTI. Head CT scan showed chronic microvascular disease and cerebral atrophy with no acute intracranial abnormalities. Right femur x-ray showed no fracture or dislocation but just osteoarthritic change most severe in the hip joint. Foot x-ray showed suspected residua of old trauma in the calcaneus with no fracture or dislocation. It showed pes planus and calcification the medial first MTP joint likely due to to calcium pyrophosphate deposition disease. It shows osteoporosis.  Pelvic x-ray showed severe osteoarthritic change in the right hip joint with osteoporotic bones without fracture or dislocation. Right hip/fib x-ray showed osteoporotic bones, knee and ankle joint osteoarthritic changes with no fracture or dislocation. CT of the right hip showed asymmetric osteoarthritis in the right hip with no acute fracture or dislocation. Right foot CT showed no fracture.  8/14:Patient seen and examined this morning. Low-grade temperature noted overnight. T-max 100.7. Patient only complaint is pain in the left leg this morning. Son at bedside. Plan for operating room today with orthopedics.  8/15: Patient seen and examined.  Postop day 1 status post percutaneous fixture for right femoral neck fracture.  Pain well controlled.  Daughter at bedside.  No fevers over interval.  Remains on IV Ancef.  8/16: Patient seen and examined.  Postop day 2 2 status post percutaneous fixture of right femoral neck fracture.  Continues to have cramping pain.  Responsive to Robaxin.  Son at bedside this morning.  No fevers noted over interval.  Completing IV Ancef today.  Remains hyperkalemic to 5.5.  Unresponsive to insulin/D50 yesterday.  Noted some wheezing on exam today.  No oxygen requirement  8/17: Seen and examined on the day of discharge.  Postop day 2 status post right femoral neck fracture operative fixation.  Cramping pain continues.  Responsive to p.o. Robaxin.  Son at bedside this morning.  No fevers over interval.  No indication for antibiotics to continue.  Kidney function and potassium have improved.  Still with some persistent wheezing but no oxygen requirement no overt shortness of breath.  Normal work of breathing.  Stable for discharge to skilled nursing facility at this time.   Discharge Diagnoses:  Active Problems:   CKD (chronic kidney disease), stage IV ([place])  Closed right hip fracture (HCC)   Chronic anticoagulation  Nondisplaced right femoral neck  fracture-- after fall and confirmed on MRI hip Postop day 3 status post percutaneous excision Followed by orthopedics, Dr. Karin Golden Eliquis for VTE prophylaxis Discharge plan: 25% weightbearing per Ortho PT and OT- rec SNF TOC consulted Eliquis for anticoagulation Multimodal pain control Robaxin prescribed on discharge  UTI Urine culture demonstrating pansensitive Klebsiella Patient received fosfomycin in the ED. Unclear whether this has activity against Klebsiella Completed 3 days of Ancef for uncomplicated cystitis  Hypertension. Continue losartan and metoprolol  Acute kidney injury on stage IV chronic kidney disease Creatinine stabilized on day of discharge.  Can continue home medications on discharge.  Follow-up outpatient PCP and nephrology   GERD. Continue PPI Added as needed Mylanta for persistent GERD symptoms  Dyslipidemia. Continue statin   History of DVT lower extremity -patient has been on eliquis for more than a year. She tells me her primary care physician Dr. Doy Hutching and cardiologist Dr. Ubaldo Glassing wanted her to be on it indefinitely -Heparin discontinued.  Now back on Eliquis  Hyperkalemia Potassium 5.5 Insulin dextrose x1 on 01/05/2020, ineffective Lokelma 5 g x 1 today Potassium stable at time of discharge  Discharge Instructions  Discharge Instructions    Diet - low sodium heart healthy   Complete by: As directed    Increase activity slowly   Complete by: As directed    No wound care   Complete by: As directed      Allergies as of 01/07/2020      Reactions   Cefuroxime Axetil Diarrhea   Codeine Nausea Only   Intolerance instead of true allerg   Sulfa Antibiotics Swelling, Rash   swelling      Medication List    TAKE these medications   acetaminophen 500 MG tablet Commonly known as: TYLENOL Take 1,000 mg by mouth daily. May take an additional 1000 mgs as needed for pain   alum & mag hydroxide-simeth 200-200-20 MG/5ML  suspension Commonly known as: MAALOX/MYLANTA Take 30 mLs by mouth every 4 (four) hours as needed for indigestion.   calcium-vitamin D 500-200 MG-UNIT tablet Commonly known as: OSCAL WITH D Take 1 tablet by mouth 2 (two) times daily.   Eliquis 2.5 MG Tabs tablet Generic drug: apixaban Take 1 tablet by mouth 2 (two) times a day.   esomeprazole 40 MG capsule Commonly known as: NEXIUM Take 40 mg by mouth daily.   ferrous gluconate 324 MG tablet Commonly known as: FERGON Take 324 mg by mouth daily.   furosemide 20 MG tablet Commonly known as: LASIX Take 20 mg by mouth daily as needed for edema.   lovastatin 40 MG tablet Commonly known as: MEVACOR Take 40 mg by mouth 2 (two) times daily.   methocarbamol 750 MG tablet Commonly known as: ROBAXIN Take 1 tablet (750 mg total) by mouth 3 (three) times daily.   metoprolol tartrate 25 MG tablet Commonly known as: LOPRESSOR Take 25 mg by mouth 2 (two) times daily.   Probiotic Advanced Caps Take 1 capsule by mouth daily.   risedronate 35 MG tablet Commonly known as: ACTONEL TAKE 1 TABLET EVERY 14 DAYS What changed: See the new instructions.   simethicone 80 MG chewable tablet Commonly known as: MYLICON Chew 1 tablet (80 mg total) by mouth 4 (four) times daily as needed for flatulence.   Stool Softener 100 MG capsule Generic drug: docusate sodium Take 100 mg by mouth 2 (two) times daily.   tamoxifen  20 MG tablet Commonly known as: NOLVADEX TAKE 1 TABLET (20 MG TOTAL) BY MOUTH DAILY.   VITAMIN B-12 IJ Inject 1,000 mcg as directed every 30 (thirty) days.   Vitamin D3 25 MCG (1000 UT) Caps Take 1,000 Units by mouth daily.       Contact information for follow-up providers    Thornton Park, MD. Schedule an appointment as soon as possible for a visit in 2 week(s).   Specialty: Orthopedic Surgery Contact information: [place] [address] [phone]        Idelle Crouch, MD. Schedule an  appointment as soon as possible for a visit in 1 week(s).   Specialty: Internal Medicine Contact information: [place] [address] [phone]            Contact information for after-discharge care    [facility] .   Service: Skilled Nursing Contact information: [place] [phone]                 Allergies  Allergen Reactions  . Cefuroxime Axetil Diarrhea  . Codeine Nausea Only    Intolerance instead of true allerg  . Sulfa Antibiotics Swelling and Rash    swelling    Consultations:  Orthopedics   Procedures/Studies: DG Pelvis 1-2 Views  Result Date: 01/01/2020 CLINICAL DATA:  Pain following fall EXAM: PELVIS - 1-2 VIEW COMPARISON:  None. FINDINGS: There is no evidence of pelvic fracture or dislocation. There is marked narrowing of the right hip joint. Left hip joint appears essentially unremarkable. Sacroiliac joints appear unremarkable bilaterally. Bones osteoporotic. IMPRESSION: Severe osteoarthritic change in the right hip joint. Bones osteoporotic. No fracture or dislocation. Electronically Signed   By: Lowella Grip III M.D.   On: 01/01/2020 14:02   DG Tibia/Fibula Right  Result Date: 01/01/2020 CLINICAL DATA:  Pain following fall EXAM: RIGHT TIBIA AND FIBULA - 2 VIEW COMPARISON:  None. FINDINGS: Frontal and lateral views were obtained. No appreciable fracture or dislocation. Bones are osteoporotic. No abnormal periosteal reaction. Mild generalized joint space narrowing. IMPRESSION: No fracture or dislocation. Bones osteoporotic. Knee and ankle joint osteoarthritic changes noted. Electronically Signed   By: Lowella Grip III M.D.   On: 01/01/2020 14:03   CT Head Wo Contrast  Result Date: 01/01/2020 CLINICAL DATA:  Status post fall 5 ago 12/27/2019 EXAM: CT HEAD WITHOUT CONTRAST TECHNIQUE: Contiguous axial images were  obtained from the base of the skull through the vertex without intravenous contrast. COMPARISON:  08/05/2010 FINDINGS: Brain: No evidence of acute infarction, hemorrhage, extra-axial collection, ventriculomegaly, or mass effect. Generalized cerebral atrophy. Periventricular white matter low attenuation likely secondary to microangiopathy. Vascular: Cerebrovascular atherosclerotic calcifications are noted. Skull: Negative for fracture or focal lesion. Sinuses/Orbits: Visualized portions of the orbits are unremarkable. Visualized portions of the paranasal sinuses are unremarkable. Visualized portions of the mastoid air cells are unremarkable. Other: None. IMPRESSION: 1. No acute intracranial pathology. 2. Chronic microvascular disease and cerebral atrophy. Electronically Signed   By: Kathreen Devoid   On: 01/01/2020 13:39   CT Hip Right Wo Contrast  Result Date: 01/01/2020 CLINICAL DATA:  Right hip and foot pain with shortening, rotation and swelling in the right foot after falling 5 days ago. EXAM: CT OF THE RIGHT HIP WITHOUT CONTRAST TECHNIQUE: Multidetector CT imaging of the right hip was performed according to the standard protocol. Multiplanar CT image reconstructions were also generated. COMPARISON:  Radiographs same date.  Pelvic CT 12/27/2013. FINDINGS: Bones/Joint/Cartilage There are advanced asymmetric osteoarthritic changes at the right hip which have progressed compared with the prior pelvic CT. There is advanced joint space narrowing with subchondral sclerosis, osteophytes and cyst formation, especially anteriorly. No evidence of acute fracture or dislocation. There is a small nonspecific right hip joint effusion with associated synovial thickening. No discrete intra-articular loose bodies are seen. Ligaments Suboptimally assessed by CT. Muscles and Tendons Mild gluteus muscular atrophy. No evidence of muscular hematoma or periarticular tendon tear. Soft tissues No evidence of periarticular hematoma or  other fluid collection. Mild iliofemoral atherosclerosis. Probable pelvic floor laxity. IMPRESSION: 1. No evidence of acute fracture or dislocation. 2. Advanced asymmetric osteoarthritic changes at the right hip with small nonspecific right hip joint effusion and synovial thickening. 3. No focal periarticular hematoma. Electronically Signed   By: Richardean Sale M.D.   On: 01/01/2020 16:25   CT Foot Right Wo Contrast  Result Date: 01/01/2020 CLINICAL DATA:  Right foot pain since fall last Friday. EXAM: CT OF THE RIGHT FOOT WITHOUT CONTRAST TECHNIQUE: Multidetector CT imaging of the right foot was performed according to the standard protocol. Multiplanar CT image reconstructions were also generated. COMPARISON:  Right foot x-rays from same day. FINDINGS: Bones/Joint/Cartilage No acute fracture or dislocation. The ankle mortise is symmetric. The talar dome is intact. Lisfranc alignment is maintained. Severe pes planus with bony cystic change in the lateral talar process and adjacent calcaneus. Mild subtalar and midfoot osteoarthritis. Mild hallux valgus deformity and first MTP joint osteoarthritis. Osteopenia. Small tibiotalar and posterior subtalar joint effusions. Scattered increased periarticular mineralization adjacent to the medial and lateral malleoli and first MTP and first IP joints. Ligaments Ligaments are suboptimally evaluated by CT. Muscles and Tendons Grossly intact.  Atrophy of the intrinsic foot muscles. Soft tissue Diffuse soft tissue swelling. No fluid collection or hematoma. No soft tissue mass. IMPRESSION: 1. No acute osseous abnormality. 2. Severe pes planus with evidence of lateral hindfoot impingement. Electronically Signed   By: Titus Dubin M.D.   On: 01/01/2020 16:36   MR HIP RIGHT WO CONTRAST  Result Date: 01/01/2020 CLINICAL DATA:  86 year old female with fall and unable to weightbear on the right hip. However, CT of the right hip earlier today negative for acute fracture or  dislocation. EXAM: MR OF THE RIGHT HIP WITHOUT CONTRAST TECHNIQUE: Multiplanar, multisequence MR imaging was performed. No intravenous contrast was administered. COMPARISON:  CT right hip 1544 hours today. FINDINGS: T1 weighted and fluid sensitive sequences of the pelvis demonstrate normal background bone marrow signal. Sacrum appears intact. The left hip appears normal. Lower lumbar disc and endplate degeneration incidentally noted. Severe right hip osteoarthritis is redemonstrated with a 15 mm area of degenerative appearing marrow edema and/or fluid fluid at the anterior superior acetabulum (series 8, image 6 and series 5, image 22) that most resembles reactive changes around a degenerative subchondral cyst. But furthermore, there is conspicuous marrow edema forming a ring around the right lower femoral neck, junction with the intertrochanteric segment (series 6, image 18 and series 9, image 18). And close scrutiny of this area on the earlier CT (series 3, images 38 and 39 of that exam) raises the possibility of a nondisplaced, incomplete fracture. However, on these coronal T1 weighted images of this study there is no obvious corresponding horizontal fracture plane. Superimposed small but asymmetric right hip joint effusion is again noted. No muscle edema identified about the right hip or pelvis. Negative visible lower abdominal  and pelvic viscera. IMPRESSION: 1. Positive for severe asymmetric right hip osteoarthritis, with both low level degenerative appearing subchondral marrow edema about the joint, but also a suspicious area of bone edema at the lower right femoral neck. Suspect an impending and/or incomplete fracture of the lower neck. 2. Small but asymmetric right hip joint effusion. 3. Elsewhere the pelvis and left hip appear intact. Study discussed by telephone with Dr. Conni Slipper on 01/01/2020 at 23:44 . Electronically Signed   By: Genevie Ann M.D.   On: 01/01/2020 23:43   DG Chest Port 1 View  Result  Date: 01/06/2020 CLINICAL DATA:  Cough EXAM: PORTABLE CHEST 1 VIEW COMPARISON:  June 14, 2007 FINDINGS: There is no edema or airspace opacity. Heart size and pulmonary vascularity are normal. There is a hiatal type hernia. There is aortic atherosclerosis. No adenopathy. No bone lesions. IMPRESSION: Hiatal type hernia. No edema or airspace opacity. Heart size normal. Aortic Atherosclerosis (ICD10-I70.0). Electronically Signed   By: Lowella Grip III M.D.   On: 01/06/2020 10:25   DG Foot Complete Right  Result Date: 01/01/2020 CLINICAL DATA:  Pain following fall EXAM: RIGHT FOOT COMPLETE - 3+ VIEW COMPARISON:  None. FINDINGS: Frontal, oblique, and lateral views were obtained. There is no acute fracture or dislocation. Remodeling of the calcaneus suggests prior trauma in this area. Bones are diffusely osteoporotic. There is calcification in the medial aspect of the first MTP joint. There is diffuse osteoarthritic change in the subtalar joints. Other joint spaces appear unremarkable. There is pes planus. IMPRESSION: Suspect residua of old trauma in the calcaneus. No acute fracture or dislocation. Pes planus. Calcification in the medial first MTP joint likely is due to calcium pyrophosphate deposition disease. Bones osteoporotic. Electronically Signed   By: Lowella Grip III M.D.   On: 01/01/2020 14:01   DG Hip Port Unilat With Pelvis 1V Right  Result Date: 01/04/2020 CLINICAL DATA:  Right hip ORIF EXAM: DG HIP (WITH OR WITHOUT PELVIS) 1V PORT RIGHT COMPARISON:  None. FINDINGS: Generalized osteopenia. Three cannulated screws transfix the right femoral neck in satisfactory position. Severe osteoarthritis of the right hip. No acute fracture or dislocation. IMPRESSION: Interval ORIF of the right femoral neck. No acute fracture or dislocation. Electronically Signed   By: Kathreen Devoid   On: 01/04/2020 13:13   DG HIP OPERATIVE UNILAT W OR W/O PELVIS RIGHT  Result Date: 01/04/2020 CLINICAL DATA:  Right  hip replacement EXAM: OPERATIVE RIGHT HIP WITH PELVIS COMPARISON:  01/01/2020 FLUOROSCOPY TIME:  Radiation Exposure Index (as provided by the fluoroscopic device): 28.8 mGy If the device does not provide the exposure index: Fluoroscopy Time:  57 seconds Number of Acquired Images:  8 FINDINGS: Significant degenerative changes of the right hip joint are seen. Three fixation screws are noted traversing the right femoral neck. IMPRESSION: ORIF of right femoral neck. Electronically Signed   By: Inez Catalina M.D.   On: 01/04/2020 11:56   DG Femur Min 2 Views Right  Result Date: 01/01/2020 CLINICAL DATA:  Pain following fall EXAM: RIGHT FEMUR 2 VIEWS COMPARISON:  None. FINDINGS: Frontal and lateral views were obtained. No fracture or dislocation. No abnormal periosteal reaction. There is severe narrowing of the right hip joint. There is milder arthropathy in the knee region. IMPRESSION: No fracture or dislocation. Osteoarthritic change, most severe in the hip joint region. Electronically Signed   By: Lowella Grip III M.D.   On: 01/01/2020 14:00    (Echo, Carotid, EGD, Colonoscopy, ERCP)    Subjective:  Patient seen and examined on day of discharge.  Stable.  Still with some cramping pain.  Responsive to oral Robaxin.  Discharge Exam: Vitals:   01/06/20 2315 01/07/20 0802  BP: (!) 121/38 135/64  Pulse: 80 77  Resp: 18 18  Temp: 98.6 F (37 C) 98.3 F (36.8 C)  SpO2: 99% 98%   Vitals:   01/06/20 1516 01/06/20 2219 01/06/20 2315 01/07/20 0802  BP: (!) 154/57 (!) 141/63 (!) 121/38 135/64  Pulse: 86  80 77  Resp: 20  18 18   Temp: 98.3 F (36.8 C)  98.6 F (37 C) 98.3 F (36.8 C)  TempSrc: Oral  Oral Oral  SpO2: 98%  99% 98%  Weight:      Height:        General: Pt is alert, awake, not in acute distress Cardiovascular: RRR, S1/S2 +, no rubs, no gallops Respiratory: Mild expiratory wheeze.  Normal work of breathing.  No oxygen requirement Abdominal: Soft, NT, ND, bowel sounds  + Extremities: no edema, no cyanosis    The results of significant diagnostics from this hospitalization (including imaging, microbiology, ancillary and laboratory) are listed below for reference.     Microbiology: Recent Results (from the past 240 hour(s))  Urine Culture     Status: Abnormal   Collection Time: 01/01/20  1:44 PM   Specimen: Urine, Random  Result Value Ref Range Status   Specimen Description   Final    URINE, RANDOM Performed at [facility], [address], [place], [place] [address]    Special Requests   Final    NONE Performed at [facility], [place], [place] [address]    Culture >=100,000 COLONIES/mL KLEBSIELLA PNEUMONIAE (A)  Final   Report Status 01/03/2020 FINAL  Final   Organism ID, Bacteria KLEBSIELLA PNEUMONIAE (A)  Final      Susceptibility   Klebsiella pneumoniae - MIC*    AMPICILLIN RESISTANT Resistant     CEFAZOLIN <=4 SENSITIVE Sensitive     CEFTRIAXONE <=0.25 SENSITIVE Sensitive     CIPROFLOXACIN <=0.25 SENSITIVE Sensitive     GENTAMICIN <=1 SENSITIVE Sensitive     IMIPENEM <=0.25 SENSITIVE Sensitive     NITROFURANTOIN 32 SENSITIVE Sensitive     TRIMETH/SULFA <=20 SENSITIVE Sensitive     AMPICILLIN/SULBACTAM 4 SENSITIVE Sensitive     PIP/TAZO <=4 SENSITIVE Sensitive     * >=100,000 COLONIES/mL KLEBSIELLA PNEUMONIAE  SARS Coronavirus 2 by RT PCR (hospital order, performed in [place] hospital lab) Nasopharyngeal Nasopharyngeal Swab     Status: None   Collection Time: 01/02/20  1:03 AM   Specimen: Nasopharyngeal Swab  Result Value Ref Range Status   SARS Coronavirus 2 NEGATIVE NEGATIVE Final    Comment: (NOTE) SARS-CoV-2 target nucleic acids are NOT DETECTED.  The SARS-CoV-2 RNA is generally detectable in upper and lower respiratory specimens during the acute phase of infection. The lowest concentration of SARS-CoV-2 viral copies this assay can detect is 250 copies / mL. A negative result does not  preclude SARS-CoV-2 infection and should not be used as the sole basis for treatment or other patient management decisions.  A negative result may occur with improper specimen collection / handling, submission of specimen other than nasopharyngeal swab, presence of viral mutation(s) within the areas targeted by this assay, and inadequate number of viral copies (<250 copies / mL). A negative result must be combined with clinical observations, patient history, and epidemiological information.  Fact Sheet for Patients:   StrictlyIdeas.no  Fact Sheet for Healthcare Providers: BankingDealers.co.za  This test is not yet approved or  cleared by the [place] FDA and has been authorized for detection and/or diagnosis of SARS-CoV-2 by FDA under an Emergency Use Authorization (EUA).  This EUA will remain in effect (meaning this test can be used) for the duration of the COVID-19 declaration under Section 564(b)(1) of the Act, 21 U.S.C. section 360bbb-3(b)(1), unless the authorization is terminated or revoked sooner.  Performed at [facility], [address], [place], [place] [address]   Surgical pcr screen     Status: Abnormal   Collection Time: 01/03/20  9:55 PM   Specimen: Nasal Mucosa; Nasal Swab  Result Value Ref Range Status   MRSA, PCR POSITIVE (A) NEGATIVE Final    Comment: RESULT CALLED TO, READ BACK BY AND VERIFIED WITH: SONYA OGLESBY AT 0016 ON 01/04/20 RWW    Staphylococcus aureus POSITIVE (A) NEGATIVE Final    Comment: (NOTE) The Xpert SA Assay (FDA approved for NASAL specimens in patients 49 years of age and older), is one component of a comprehensive surveillance program. It is not intended to diagnose infection nor to guide or monitor treatment. Performed at [facility], [address] [place], [place] [address]      Labs: BNP (last 3 results) No results for input(s): BNP in the last 8760  hours. Basic Metabolic Panel: Recent Labs  Lab 01/01/20 1344 01/02/20 0343 01/05/20 0415 01/06/20 0235 01/07/20 0530  NA 140 141 134* 131* 133*  K 4.5 4.7 5.5* 5.5* 4.7  CL 108 106 103 100 104  CO2 21* 25 23 21* 22  GLUCOSE 101* 92 157* 116* 88  BUN 27* 27* 29* 32* 32*  CREATININE 1.95* 1.74* 2.04* 2.14* 1.83*  CALCIUM 7.9* 8.2* 7.2* 7.4* 7.0*   Liver Function Tests: No results for input(s): AST, ALT, ALKPHOS, BILITOT, PROT, ALBUMIN in the last 168 hours. No results for input(s): LIPASE, AMYLASE in the last 168 hours. No results for input(s): AMMONIA in the last 168 hours. CBC: Recent Labs  Lab 01/03/20 0607 01/04/20 0430 01/05/20 0415 01/06/20 0235 01/07/20 0530  WBC 7.1 8.8 9.2 11.9* 7.7  HGB 8.6* 7.7* 8.7* 8.1* 7.4*  HCT 27.0* 24.6* 25.9* 25.0* 23.0*  MCV 98.9 98.8 93.5 96.9 96.6  PLT 151 165 175 224 222   Cardiac Enzymes: No results for input(s): CKTOTAL, CKMB, CKMBINDEX, TROPONINI in the last 168 hours. BNP: Invalid input(s): POCBNP CBG: No results for input(s): GLUCAP in the last 168 hours. D-Dimer No results for input(s): DDIMER in the last 72 hours. Hgb A1c No results for input(s): HGBA1C in the last 72 hours. Lipid Profile No results for input(s): CHOL, HDL, LDLCALC, TRIG, CHOLHDL, LDLDIRECT in the last 72 hours. Thyroid function studies No results for input(s): TSH, T4TOTAL, T3FREE, THYROIDAB in the last 72 hours.  Invalid input(s): FREET3 Anemia work up No results for input(s): VITAMINB12, FOLATE, FERRITIN, TIBC, IRON, RETICCTPCT in the last 72 hours. Urinalysis    Component Value Date/Time   COLORURINE YELLOW (A) 01/01/2020 1344   APPEARANCEUR HAZY (A) 01/01/2020 1344   APPEARANCEUR Clear 12/19/2016 1404   LABSPEC 1.012 01/01/2020 1344   PHURINE 5.0 01/01/2020 1344   GLUCOSEU NEGATIVE 01/01/2020 1344   HGBUR NEGATIVE 01/01/2020 1344   BILIRUBINUR NEGATIVE 01/01/2020 1344   BILIRUBINUR Negative 12/19/2016 [place]  01/01/2020 1344   PROTEINUR NEGATIVE 01/01/2020 1344   NITRITE NEGATIVE 01/01/2020 1344   LEUKOCYTESUR SMALL (A) 01/01/2020 1344  Sepsis Labs Invalid input(s): PROCALCITONIN,  WBC,  LACTICIDVEN Microbiology Recent Results (from the past 240 hour(s))  Urine Culture     Status: Abnormal   Collection Time: 01/01/20  1:44 PM   Specimen: Urine, Random  Result Value Ref Range Status   Specimen Description   Final    URINE, RANDOM Performed at [facility], [address] [place], [place] [address]    Special Requests   Final    NONE Performed at [facility], [place], [place] [address]    Culture >=100,000 COLONIES/mL KLEBSIELLA PNEUMONIAE (A)  Final   Report Status 01/03/2020 FINAL  Final   Organism ID, Bacteria KLEBSIELLA PNEUMONIAE (A)  Final      Susceptibility   Klebsiella pneumoniae - MIC*    AMPICILLIN RESISTANT Resistant     CEFAZOLIN <=4 SENSITIVE Sensitive     CEFTRIAXONE <=0.25 SENSITIVE Sensitive     CIPROFLOXACIN <=0.25 SENSITIVE Sensitive     GENTAMICIN <=1 SENSITIVE Sensitive     IMIPENEM <=0.25 SENSITIVE Sensitive     NITROFURANTOIN 32 SENSITIVE Sensitive     TRIMETH/SULFA <=20 SENSITIVE Sensitive     AMPICILLIN/SULBACTAM 4 SENSITIVE Sensitive     PIP/TAZO <=4 SENSITIVE Sensitive     * >=100,000 COLONIES/mL KLEBSIELLA PNEUMONIAE  SARS Coronavirus 2 by RT PCR (hospital order, performed in [place] hospital lab) Nasopharyngeal Nasopharyngeal Swab     Status: None   Collection Time: 01/02/20  1:03 AM   Specimen: Nasopharyngeal Swab  Result Value Ref Range Status   SARS Coronavirus 2 NEGATIVE NEGATIVE Final    Comment: (NOTE) SARS-CoV-2 target nucleic acids are NOT DETECTED.  The SARS-CoV-2 RNA is generally detectable in upper and lower respiratory specimens during the acute phase of infection. The lowest concentration of SARS-CoV-2 viral copies this assay can detect is 250 copies / mL. A negative result does not preclude  SARS-CoV-2 infection and should not be used as the sole basis for treatment or other patient management decisions.  A negative result may occur with improper specimen collection / handling, submission of specimen other than nasopharyngeal swab, presence of viral mutation(s) within the areas targeted by this assay, and inadequate number of viral copies (<250 copies / mL). A negative result must be combined with clinical observations, patient history, and epidemiological information.  Fact Sheet for Patients:   StrictlyIdeas.no  Fact Sheet for Healthcare Providers: BankingDealers.co.za  This test is not yet approved or  cleared by the [place] FDA and has been authorized for detection and/or diagnosis of SARS-CoV-2 by FDA under an Emergency Use Authorization (EUA).  This EUA will remain in effect (meaning this test can be used) for the duration of the COVID-19 declaration under Section 564(b)(1) of the Act, 21 U.S.C. section 360bbb-3(b)(1), unless the authorization is terminated or revoked sooner.  Performed at [facility], [address], [place], [place] [address]   Surgical pcr screen     Status: Abnormal   Collection Time: 01/03/20  9:55 PM   Specimen: Nasal Mucosa; Nasal Swab  Result Value Ref Range Status   MRSA, PCR POSITIVE (A) NEGATIVE Final    Comment: RESULT CALLED TO, READ BACK BY AND VERIFIED WITH: SONYA OGLESBY AT 0016 ON 01/04/20 RWW    Staphylococcus aureus POSITIVE (A) NEGATIVE Final    Comment: (NOTE) The Xpert SA Assay (FDA approved for NASAL specimens in patients 25 years of age and older), is one component of a comprehensive surveillance program. It is not intended  to diagnose infection nor to guide or monitor treatment. Performed at [facility], [address], [place], [place] [address]      Time coordinating discharge: Over 30 minutes  SIGNED:   Sidney Ace,  MD  [facility] 01/07/2020, 11:13 AM Pager   If 7PM-7AM, please contact night-coverage

## 2020-01-07 NOTE — Progress Notes (Signed)
Physical Therapy Treatment Patient Details Name: Rachel Jones MRN: [id number] DOB: [birth year] Today's Date: 01/07/2020    History of Present Illness Patient is an 86 y/o F that presents with R non-displaced hip fx, sx date of 01/04/20 with percutaneous pinning to manage. She has a history of bil knee and L hip pain.    PT Comments    Pt was in supine with OT in room adjusting pt for comfort. Upon assisting pt to [facility] therapist noticed bed soiled. RN tech called to assist author with clean up. Pt is in extreme pain with all movements. Requires increased time + min A + use of bed rails to roll R/L while hygiene and linen change made. Pt is slightly SOB with wheezing however sao2 >96% and HR stable. Not appropriate for OOB activity due to pain. Did review bed level exercises and importance of performing exercises throughout the day to promote strengthening. She states understanding. Pt is being DC to SNF this afternoon and will benefit from continued PT to address strength, mobility, and balance deficits.      Follow Up Recommendations  SNF     Equipment Recommendations  Other (comment) (defer to next level of care)    Recommendations for Other Services       Precautions / Restrictions Precautions Precautions: Fall Restrictions Weight Bearing Restrictions: Yes RLE Weight Bearing: Partial weight bearing    Mobility  Bed Mobility               General bed mobility comments: pt was soiled in bed upon arriving. RN tech called and assisted therapist with clean up. pt able to roll R/L using bedrails + min assist  Transfers                 General transfer comment: unable 2/2 to pain  Ambulation/Gait             General Gait Details: pain   Stairs             Wheelchair Mobility    Modified Rankin (Stroke Patients Only)       Balance                                            Cognition Arousal/Alertness: Awake/alert Behavior  During Therapy: WFL for tasks assessed/performed Overall Cognitive Status: Within Functional Limits for tasks assessed                                 General Comments: Pt is A and O x 4       Exercises General Exercises - Lower Extremity Ankle Circles/Pumps: AROM;Both;10 reps Quad Sets: AROM;Both;10 reps Gluteal Sets: AROM;Both;10 reps    General Comments        Pertinent Vitals/Pain Pain Assessment: 0-10 Pain Score: 8  Faces Pain Scale: Hurts whole lot Pain Location: R knee, L knee, L hip Pain Descriptors / Indicators: Aching;Sore Pain Intervention(s): Limited activity within patient's tolerance;Monitored during session;Premedicated before session;Repositioned    Home Living                      Prior Function            PT Goals (current goals can now be found in the care plan section) Acute Rehab PT Goals Patient Stated  Goal: To get stronger to ambulate more independently. Progress towards PT goals: Progressing toward goals    Frequency    BID      PT Plan Current plan remains appropriate    Co-evaluation     PT goals addressed during session: Mobility/safety with mobility;Balance;Proper use of DME;Strengthening/ROM        AM-PAC PT "6 Clicks" Mobility   Outcome Measure  Help needed turning from your back to your side while in a flat bed without using bedrails?: A Little Help needed moving from lying on your back to sitting on the side of a flat bed without using bedrails?: Total Help needed moving to and from a bed to a chair (including a wheelchair)?: Total Help needed standing up from a chair using your arms (e.g., wheelchair or bedside chair)?: Total Help needed to walk in hospital room?: Total Help needed climbing 3-5 steps with a railing? : Total 6 Click Score: 8    End of Session Equipment Utilized During Treatment: Gait belt Activity Tolerance: Patient limited by pain Patient left: in bed;with call bell/phone  within reach;with bed alarm set;with family/visitor present Nurse Communication: Mobility status PT Visit Diagnosis: Repeated falls (R29.6)     Time: 1140-1155 PT Time Calculation (min) (ACUTE ONLY): 15 min  Charges:  $Therapeutic Activity: 8-22 mins                     Julaine Fusi PTA 01/07/20, 12:43 PM

## 2020-01-07 NOTE — Progress Notes (Signed)
  Subjective:  POD #3 s/p percutaneous fixation for right femoral neck hip fracture.   Patient reports right pain as moderate to severe.  Patient's son at the bedside.  Objective:   VITALS:   Vitals:   01/06/20 1516 01/06/20 2219 01/06/20 2315 01/07/20 0802  BP: (!) 154/57 (!) 141/63 (!) 121/38 135/64  Pulse: 86  80 77  Resp: 20  18 18   Temp: 98.3 F (36.8 C)  98.6 F (37 C) 98.3 F (36.8 C)  TempSrc: Oral  Oral Oral  SpO2: 98%  99% 98%  Weight:      Height:        PHYSICAL EXAM: Right lower extremity Neurovascular intact Sensation intact distally Intact pulses distally Dorsiflexion/Plantar flexion intact Incision: dressing C/D/I No cellulitis present Compartment soft  LABS  Results for orders placed or performed during the hospital encounter of 01/01/20 (from the past 24 hour(s))  CBC     Status: Abnormal   Collection Time: 01/07/20  5:30 AM  Result Value Ref Range   WBC 7.7 4.0 - 10.5 K/uL   RBC 2.38 (L) 3.87 - 5.11 MIL/uL   Hemoglobin 7.4 (L) 12.0 - 15.0 g/dL   HCT 23.0 (L) 36 - 46 %   MCV 96.6 80.0 - 100.0 fL   MCH 31.1 26.0 - 34.0 pg   MCHC 32.2 30.0 - 36.0 g/dL   RDW 12.5 11.5 - 15.5 %   Platelets 222 150 - 400 K/uL   nRBC 0.0 0.0 - 0.2 %  Basic metabolic panel     Status: Abnormal   Collection Time: 01/07/20  5:30 AM  Result Value Ref Range   Sodium 133 (L) 135 - 145 mmol/L   Potassium 4.7 3.5 - 5.1 mmol/L   Chloride 104 98 - 111 mmol/L   CO2 22 22 - 32 mmol/L   Glucose, Bld 88 70 - 99 mg/dL   BUN 32 (H) 8 - 23 mg/dL   Creatinine, Ser 1.83 (H) 0.44 - 1.00 mg/dL   Calcium 7.0 (L) 8.9 - 10.3 mg/dL   GFR calc non Af Amer 25 (L) >60 mL/min   GFR calc Af Amer 29 (L) >60 mL/min   Anion gap 7 5 - 15    DG Chest Port 1 View  Result Date: 01/06/2020 CLINICAL DATA:  Cough EXAM: PORTABLE CHEST 1 VIEW COMPARISON:  June 14, 2007 FINDINGS: There is no edema or airspace opacity. Heart size and pulmonary vascularity are normal. There is a hiatal type  hernia. There is aortic atherosclerosis. No adenopathy. No bone lesions. IMPRESSION: Hiatal type hernia. No edema or airspace opacity. Heart size normal. Aortic Atherosclerosis (ICD10-I70.0). Electronically Signed   By: Lowella Grip III M.D.   On: 01/06/2020 10:25    Assessment/Plan: 3 Days Post-Op   Active Problems:   CKD (chronic kidney disease), stage IV (HCC)   Closed right hip fracture (HCC)   Chronic anticoagulation  Patient being discharged to SNF today.  She will follow up in 10-14 days in the office for wound check, staple removal and xray.  Patient is to remain 25%WB on the right lower extremity until follow up.   She is on eliquis.    Thornton Park , MD 01/07/2020, 1:09 PM

## 2020-01-08 DIAGNOSIS — M8000XD Age-related osteoporosis with current pathological fracture, unspecified site, subsequent encounter for fracture with routine healing: Secondary | ICD-10-CM | POA: Insufficient documentation

## 2020-01-08 DIAGNOSIS — N184 Chronic kidney disease, stage 4 (severe): Secondary | ICD-10-CM | POA: Diagnosis not present

## 2020-01-08 DIAGNOSIS — I825Y1 Chronic embolism and thrombosis of unspecified deep veins of right proximal lower extremity: Secondary | ICD-10-CM | POA: Diagnosis not present

## 2020-01-20 DIAGNOSIS — S72034A Nondisplaced midcervical fracture of right femur, initial encounter for closed fracture: Secondary | ICD-10-CM | POA: Diagnosis not present

## 2020-02-11 DIAGNOSIS — H40003 Preglaucoma, unspecified, bilateral: Secondary | ICD-10-CM | POA: Diagnosis not present

## 2020-02-11 DIAGNOSIS — H34831 Tributary (branch) retinal vein occlusion, right eye, with macular edema: Secondary | ICD-10-CM | POA: Diagnosis not present

## 2020-02-17 DIAGNOSIS — S72001A Fracture of unspecified part of neck of right femur, initial encounter for closed fracture: Secondary | ICD-10-CM | POA: Diagnosis not present

## 2020-03-09 DIAGNOSIS — S72001A Fracture of unspecified part of neck of right femur, initial encounter for closed fracture: Secondary | ICD-10-CM | POA: Diagnosis not present

## 2020-03-18 DIAGNOSIS — D649 Anemia, unspecified: Secondary | ICD-10-CM | POA: Diagnosis not present

## 2020-03-19 DIAGNOSIS — N184 Chronic kidney disease, stage 4 (severe): Secondary | ICD-10-CM | POA: Diagnosis not present

## 2020-03-19 DIAGNOSIS — Z789 Other specified health status: Secondary | ICD-10-CM | POA: Diagnosis not present

## 2020-03-19 DIAGNOSIS — Z0000 Encounter for general adult medical examination without abnormal findings: Secondary | ICD-10-CM | POA: Diagnosis not present

## 2020-03-19 DIAGNOSIS — I825Y1 Chronic embolism and thrombosis of unspecified deep veins of right proximal lower extremity: Secondary | ICD-10-CM | POA: Diagnosis not present

## 2020-03-19 DIAGNOSIS — I779 Disorder of arteries and arterioles, unspecified: Secondary | ICD-10-CM | POA: Diagnosis not present

## 2020-03-22 DIAGNOSIS — M25551 Pain in right hip: Secondary | ICD-10-CM | POA: Diagnosis not present

## 2020-03-22 DIAGNOSIS — M1611 Unilateral primary osteoarthritis, right hip: Secondary | ICD-10-CM | POA: Insufficient documentation

## 2020-03-22 DIAGNOSIS — M6281 Muscle weakness (generalized): Secondary | ICD-10-CM | POA: Diagnosis not present

## 2020-03-22 DIAGNOSIS — R1312 Dysphagia, oropharyngeal phase: Secondary | ICD-10-CM | POA: Diagnosis not present

## 2020-03-22 DIAGNOSIS — S72019A Unspecified intracapsular fracture of unspecified femur, initial encounter for closed fracture: Secondary | ICD-10-CM | POA: Insufficient documentation

## 2020-04-07 DIAGNOSIS — N184 Chronic kidney disease, stage 4 (severe): Secondary | ICD-10-CM | POA: Diagnosis not present

## 2020-04-13 ENCOUNTER — Encounter (INDEPENDENT_AMBULATORY_CARE_PROVIDER_SITE_OTHER): Payer: Medicare HMO

## 2020-04-13 ENCOUNTER — Ambulatory Visit (INDEPENDENT_AMBULATORY_CARE_PROVIDER_SITE_OTHER): Payer: Medicare HMO | Admitting: Vascular Surgery

## 2020-04-21 DIAGNOSIS — H34831 Tributary (branch) retinal vein occlusion, right eye, with macular edema: Secondary | ICD-10-CM | POA: Diagnosis not present

## 2020-09-12 ENCOUNTER — Encounter: Payer: Self-pay | Admitting: Emergency Medicine

## 2020-09-12 ENCOUNTER — Inpatient Hospital Stay
Admission: EM | Admit: 2020-09-12 | Discharge: 2020-09-15 | DRG: 640 | Disposition: A | Discharge status: Skilled Nursing Facility | Payer: Medicare (Managed Care) | Attending: Internal Medicine | Admitting: Internal Medicine

## 2020-09-12 ENCOUNTER — Other Ambulatory Visit: Payer: Self-pay

## 2020-09-12 ENCOUNTER — Emergency Department: Payer: Medicare (Managed Care)

## 2020-09-12 DIAGNOSIS — Z20822 Contact with and (suspected) exposure to covid-19: Secondary | ICD-10-CM | POA: Diagnosis present

## 2020-09-12 DIAGNOSIS — D509 Iron deficiency anemia, unspecified: Secondary | ICD-10-CM | POA: Diagnosis present

## 2020-09-12 DIAGNOSIS — R627 Adult failure to thrive: Secondary | ICD-10-CM | POA: Diagnosis present

## 2020-09-12 DIAGNOSIS — Z9011 Acquired absence of right breast and nipple: Secondary | ICD-10-CM

## 2020-09-12 DIAGNOSIS — Z8542 Personal history of malignant neoplasm of other parts of uterus: Secondary | ICD-10-CM

## 2020-09-12 DIAGNOSIS — I10 Essential (primary) hypertension: Secondary | ICD-10-CM

## 2020-09-12 DIAGNOSIS — I825Z9 Chronic embolism and thrombosis of unspecified deep veins of unspecified distal lower extremity: Secondary | ICD-10-CM | POA: Diagnosis present

## 2020-09-12 DIAGNOSIS — R4182 Altered mental status, unspecified: Secondary | ICD-10-CM | POA: Diagnosis not present

## 2020-09-12 DIAGNOSIS — Z7901 Long term (current) use of anticoagulants: Secondary | ICD-10-CM

## 2020-09-12 DIAGNOSIS — M1990 Unspecified osteoarthritis, unspecified site: Secondary | ICD-10-CM | POA: Diagnosis present

## 2020-09-12 DIAGNOSIS — I2510 Atherosclerotic heart disease of native coronary artery without angina pectoris: Secondary | ICD-10-CM | POA: Diagnosis present

## 2020-09-12 DIAGNOSIS — I129 Hypertensive chronic kidney disease with stage 1 through stage 4 chronic kidney disease, or unspecified chronic kidney disease: Secondary | ICD-10-CM | POA: Diagnosis present

## 2020-09-12 DIAGNOSIS — Z7401 Bed confinement status: Secondary | ICD-10-CM

## 2020-09-12 DIAGNOSIS — Z993 Dependence on wheelchair: Secondary | ICD-10-CM | POA: Diagnosis not present

## 2020-09-12 DIAGNOSIS — G4733 Obstructive sleep apnea (adult) (pediatric): Secondary | ICD-10-CM | POA: Diagnosis present

## 2020-09-12 DIAGNOSIS — Z87440 Personal history of urinary (tract) infections: Secondary | ICD-10-CM

## 2020-09-12 DIAGNOSIS — E785 Hyperlipidemia, unspecified: Secondary | ICD-10-CM | POA: Diagnosis present

## 2020-09-12 DIAGNOSIS — I825Y9 Chronic embolism and thrombosis of unspecified deep veins of unspecified proximal lower extremity: Secondary | ICD-10-CM

## 2020-09-12 DIAGNOSIS — Z8541 Personal history of malignant neoplasm of cervix uteri: Secondary | ICD-10-CM | POA: Diagnosis not present

## 2020-09-12 DIAGNOSIS — Z923 Personal history of irradiation: Secondary | ICD-10-CM

## 2020-09-12 DIAGNOSIS — Z885 Allergy status to narcotic agent status: Secondary | ICD-10-CM

## 2020-09-12 DIAGNOSIS — G9341 Metabolic encephalopathy: Secondary | ICD-10-CM | POA: Diagnosis present

## 2020-09-12 DIAGNOSIS — K219 Gastro-esophageal reflux disease without esophagitis: Secondary | ICD-10-CM | POA: Diagnosis present

## 2020-09-12 DIAGNOSIS — Z66 Do not resuscitate: Secondary | ICD-10-CM | POA: Diagnosis present

## 2020-09-12 DIAGNOSIS — M069 Rheumatoid arthritis, unspecified: Secondary | ICD-10-CM | POA: Diagnosis present

## 2020-09-12 DIAGNOSIS — M109 Gout, unspecified: Secondary | ICD-10-CM | POA: Diagnosis present

## 2020-09-12 DIAGNOSIS — C50211 Malignant neoplasm of upper-inner quadrant of right female breast: Secondary | ICD-10-CM

## 2020-09-12 DIAGNOSIS — M791 Myalgia, unspecified site: Secondary | ICD-10-CM

## 2020-09-12 DIAGNOSIS — Z882 Allergy status to sulfonamides status: Secondary | ICD-10-CM

## 2020-09-12 DIAGNOSIS — Z853 Personal history of malignant neoplasm of breast: Secondary | ICD-10-CM

## 2020-09-12 DIAGNOSIS — R410 Disorientation, unspecified: Secondary | ICD-10-CM

## 2020-09-12 DIAGNOSIS — Z881 Allergy status to other antibiotic agents status: Secondary | ICD-10-CM

## 2020-09-12 DIAGNOSIS — Z79899 Other long term (current) drug therapy: Secondary | ICD-10-CM

## 2020-09-12 DIAGNOSIS — N184 Chronic kidney disease, stage 4 (severe): Secondary | ICD-10-CM | POA: Diagnosis present

## 2020-09-12 DIAGNOSIS — C539 Malignant neoplasm of cervix uteri, unspecified: Secondary | ICD-10-CM | POA: Diagnosis present

## 2020-09-12 LAB — SARS CORONAVIRUS 2 (TAT 6-24 HRS): SARS Coronavirus 2: NEGATIVE

## 2020-09-12 LAB — CBC WITH DIFFERENTIAL/PLATELET
Abs Immature Granulocytes: 0.01 10*3/uL (ref 0.00–0.07)
Basophils Absolute: 0.1 10*3/uL (ref 0.0–0.1)
Basophils Relative: 1 %
Eosinophils Absolute: 0.2 10*3/uL (ref 0.0–0.5)
Eosinophils Relative: 3 %
HCT: 32.8 % — ABNORMAL LOW (ref 36.0–46.0)
Hemoglobin: 10.7 g/dL — ABNORMAL LOW (ref 12.0–15.0)
Immature Granulocytes: 0 %
Lymphocytes Relative: 22 %
Lymphs Abs: 1.4 10*3/uL (ref 0.7–4.0)
MCH: 31.2 pg (ref 26.0–34.0)
MCHC: 32.6 g/dL (ref 30.0–36.0)
MCV: 95.6 fL (ref 80.0–100.0)
Monocytes Absolute: 0.6 10*3/uL (ref 0.1–1.0)
Monocytes Relative: 9 %
Neutro Abs: 4.3 10*3/uL (ref 1.7–7.7)
Neutrophils Relative %: 65 %
Platelets: 144 10*3/uL — ABNORMAL LOW (ref 150–400)
RBC: 3.43 MIL/uL — ABNORMAL LOW (ref 3.87–5.11)
RDW: 12.3 % (ref 11.5–15.5)
WBC: 6.5 10*3/uL (ref 4.0–10.5)
nRBC: 0 % (ref 0.0–0.2)

## 2020-09-12 LAB — COMPREHENSIVE METABOLIC PANEL
ALT: 6 U/L (ref 0–44)
AST: 18 U/L (ref 15–41)
Albumin: 3.2 g/dL — ABNORMAL LOW (ref 3.5–5.0)
Alkaline Phosphatase: 34 U/L — ABNORMAL LOW (ref 38–126)
Anion gap: 9 (ref 5–15)
BUN: 36 mg/dL — ABNORMAL HIGH (ref 8–23)
CO2: 24 mmol/L (ref 22–32)
Calcium: 13.1 mg/dL (ref 8.9–10.3)
Chloride: 104 mmol/L (ref 98–111)
Creatinine, Ser: 1.79 mg/dL — ABNORMAL HIGH (ref 0.44–1.00)
GFR, Estimated: 27 mL/min — ABNORMAL LOW (ref >60)
Glucose, Bld: 99 mg/dL (ref 70–99)
Potassium: 4.7 mmol/L (ref 3.5–5.1)
Sodium: 137 mmol/L (ref 135–145)
Total Bilirubin: 0.7 mg/dL (ref 0.3–1.2)
Total Protein: 6.1 g/dL — ABNORMAL LOW (ref 6.5–8.1)

## 2020-09-12 LAB — URINALYSIS, COMPLETE (UACMP) WITH MICROSCOPIC
Bacteria, UA: NONE SEEN
Bilirubin Urine: NEGATIVE
Glucose, UA: NEGATIVE mg/dL
Hgb urine dipstick: NEGATIVE
Ketones, ur: NEGATIVE mg/dL
Leukocytes,Ua: NEGATIVE
Nitrite: NEGATIVE
Protein, ur: NEGATIVE mg/dL
Specific Gravity, Urine: 1.014 (ref 1.005–1.030)
pH: 5 (ref 5.0–8.0)

## 2020-09-12 LAB — TROPONIN I (HIGH SENSITIVITY): Troponin I (High Sensitivity): 14 ng/L (ref <18)

## 2020-09-12 LAB — BRAIN NATRIURETIC PEPTIDE: B Natriuretic Peptide: 130.2 pg/mL — ABNORMAL HIGH (ref 0.0–100.0)

## 2020-09-12 LAB — PHOSPHORUS: Phosphorus: 2.7 mg/dL (ref 2.5–4.6)

## 2020-09-12 LAB — MAGNESIUM: Magnesium: 1 mg/dL — ABNORMAL LOW (ref 1.7–2.4)

## 2020-09-12 MED ORDER — LACTATED RINGERS IV BOLUS
500.0000 mL | Freq: Once | INTRAVENOUS | Status: AC
  Administered 2020-09-12: 500 mL via INTRAVENOUS

## 2020-09-12 MED ORDER — NITROFURANTOIN MONOHYD MACRO 100 MG PO CAPS: 100.0000 mg | ORAL_CAPSULE | Freq: Two times a day (BID) | ORAL | Status: DC

## 2020-09-12 MED ORDER — CEPHALEXIN 500 MG PO CAPS
500.0000 mg | ORAL_CAPSULE | Freq: Two times a day (BID) | ORAL | Status: DC
  Administered 2020-09-12 – 2020-09-14 (×5): 500 mg via ORAL
  Filled 2020-09-12 (×4): qty 1

## 2020-09-12 MED ORDER — LACTATED RINGERS IV SOLN: INTRAVENOUS | Status: DC

## 2020-09-12 MED ORDER — APIXABAN 2.5 MG PO TABS
2.5000 mg | ORAL_TABLET | Freq: Two times a day (BID) | ORAL | Status: DC
  Administered 2020-09-12 – 2020-09-15 (×7): 2.5 mg via ORAL
  Filled 2020-09-12 (×6): qty 1

## 2020-09-12 MED ORDER — TAMOXIFEN CITRATE 10 MG PO TABS
20.0000 mg | ORAL_TABLET | Freq: Every day | ORAL | Status: DC
  Administered 2020-09-13 – 2020-09-15 (×3): 20 mg via ORAL
  Filled 2020-09-12 (×3): qty 2

## 2020-09-12 MED ORDER — PRAVASTATIN SODIUM 20 MG PO TABS
40.0000 mg | ORAL_TABLET | Freq: Every day | ORAL | Status: DC
  Administered 2020-09-13 – 2020-09-14 (×2): 40 mg via ORAL
  Filled 2020-09-12: qty 2

## 2020-09-12 MED ORDER — ALUM & MAG HYDROXIDE-SIMETH 200-200-20 MG/5ML PO SUSP: 30.0000 mL | ORAL | Status: DC | PRN

## 2020-09-12 MED ORDER — PANTOPRAZOLE SODIUM 40 MG PO TBEC
40.0000 mg | DELAYED_RELEASE_TABLET | Freq: Every day | ORAL | Status: DC
  Administered 2020-09-13 – 2020-09-15 (×3): 40 mg via ORAL
  Filled 2020-09-12 (×3): qty 1

## 2020-09-12 MED ORDER — ONDANSETRON HCL 4 MG/2ML IJ SOLN: 4.0000 mg | Freq: Three times a day (TID) | INTRAMUSCULAR | Status: DC | PRN

## 2020-09-12 MED ORDER — METOPROLOL TARTRATE 25 MG PO TABS
25.0000 mg | ORAL_TABLET | Freq: Two times a day (BID) | ORAL | Status: DC
  Administered 2020-09-12 – 2020-09-15 (×7): 25 mg via ORAL
  Filled 2020-09-12 (×6): qty 1

## 2020-09-12 MED ORDER — SIMETHICONE 80 MG PO CHEW
80.0000 mg | CHEWABLE_TABLET | Freq: Four times a day (QID) | ORAL | Status: DC | PRN
  Administered 2020-09-12 – 2020-09-14 (×2): 80 mg via ORAL
  Filled 2020-09-12 (×3): qty 1

## 2020-09-12 MED ORDER — FERROUS GLUCONATE 324 (38 FE) MG PO TABS
324.0000 mg | ORAL_TABLET | Freq: Every day | ORAL | Status: DC
  Administered 2020-09-12 – 2020-09-15 (×4): 324 mg via ORAL
  Filled 2020-09-12 (×4): qty 1

## 2020-09-12 MED ORDER — RISAQUAD PO CAPS
1.0000 | ORAL_CAPSULE | Freq: Every day | ORAL | Status: DC
  Administered 2020-09-13 – 2020-09-15 (×3): 1 via ORAL
  Filled 2020-09-12 (×3): qty 1

## 2020-09-12 MED ORDER — ACETAMINOPHEN 325 MG PO TABS
650.0000 mg | ORAL_TABLET | Freq: Four times a day (QID) | ORAL | Status: DC | PRN
  Administered 2020-09-12 – 2020-09-15 (×4): 650 mg via ORAL
  Filled 2020-09-12 (×4): qty 2

## 2020-09-12 MED ORDER — ACETAMINOPHEN 500 MG PO TABS
1000.0000 mg | ORAL_TABLET | Freq: Once | ORAL | Status: DC
  Filled 2020-09-12: qty 2

## 2020-09-12 MED ORDER — DOCUSATE SODIUM 100 MG PO CAPS
100.0000 mg | ORAL_CAPSULE | Freq: Two times a day (BID) | ORAL | Status: DC
  Administered 2020-09-12 – 2020-09-15 (×6): 100 mg via ORAL
  Filled 2020-09-12 (×6): qty 1

## 2020-09-12 MED ORDER — MAGNESIUM SULFATE 4 GM/100ML IV SOLN: 4.0000 g | Freq: Once | INTRAVENOUS | Status: DC

## 2020-09-12 MED ORDER — METHOCARBAMOL 500 MG PO TABS
750.0000 mg | ORAL_TABLET | Freq: Three times a day (TID) | ORAL | Status: DC
  Administered 2020-09-12 – 2020-09-15 (×9): 750 mg via ORAL
  Filled 2020-09-12 (×9): qty 2

## 2020-09-12 MED ORDER — AMLODIPINE BESYLATE 5 MG PO TABS
5.0000 mg | ORAL_TABLET | Freq: Every day | ORAL | Status: DC
  Administered 2020-09-12 – 2020-09-15 (×4): 5 mg via ORAL
  Filled 2020-09-12 (×4): qty 1

## 2020-09-12 MED ORDER — MAGNESIUM CHLORIDE 64 MG PO TBEC
1.0000 | DELAYED_RELEASE_TABLET | Freq: Every day | ORAL | Status: DC
  Administered 2020-09-12 – 2020-09-15 (×4): 64 mg via ORAL
  Filled 2020-09-12 (×4): qty 1

## 2020-09-12 MED ORDER — HYDRALAZINE HCL 20 MG/ML IJ SOLN: 5.0000 mg | INTRAMUSCULAR | Status: DC | PRN

## 2020-09-12 MED ORDER — SODIUM CHLORIDE 0.9 % IV SOLN
90.0000 mg | Freq: Once | INTRAVENOUS | Status: AC
  Administered 2020-09-12: 90 mg via INTRAVENOUS
  Filled 2020-09-12: qty 10

## 2020-09-12 MED ORDER — ZINC GLUCONATE 50 MG PO TABS: 50.0000 mg | ORAL_TABLET | Freq: Every day | ORAL | Status: DC

## 2020-09-12 MED ORDER — MAGNESIUM SULFATE 50 % IJ SOLN
3.0000 g | Freq: Once | INTRAVENOUS | Status: AC
  Administered 2020-09-12: 3 g via INTRAVENOUS
  Filled 2020-09-12: qty 6

## 2020-09-12 NOTE — ED Notes (Signed)
This RN tried to administer PO Tylenol. Pt states, "I can't take those pills because they are not coated. I will get choked." Offered to crush the pills and put it applesauce but refused and states, "I will get choked. I can't take those."

## 2020-09-12 NOTE — ED Triage Notes (Addendum)
Pt via EMS from [facility]. Per staff, this morning pt was confused and '"shaking uncontrollably." Pt has a hx of UTIs in the past. Pt has been on Macrobid since yesterday. On arrival, pt is alert but oriented to self and placed. Disoriented to time. Denies abd pain.

## 2020-09-12 NOTE — ED Notes (Signed)
Dr. Tamala Julian MD at bedside.

## 2020-09-12 NOTE — Consult Note (Signed)
Hematology/Oncology Consult note [facility], [facility] [phone] [phone]  Patient Care Team: Idelle Crouch, MD as PCP - General (Internal Medicine)   Name of the patient: Rachel Jones  [id number]  03-18-1934    Reason for referral-history of breast cancer admitted for hypercalcemia   Referring physician-Dr. Blaine Hamper  Date of visit:09/12/2020    History of presenting illness- Patient is a 87 year old female who was diagnosed with stage I ER/PR positive HER2 negative right breast cancer.  She had a high risk MammaPrint score but was not deemed to be a candidate for adjuvant chemotherapy and has been on tamoxifen for the same.  She was last seen by Dr. Grayland Ormond in February 2021.  She is presently admitted to the hospital with symptoms of altered mental status and weakness and was found to have a serum calcium level of 13.  She has not had any known hypercalcemia in the past.  She has been on IV fluids 75 cc an hour and has been given a dose of pamidronate.  Work-up for hypercalcemia including a PTH RP has been initiated.  Oncology consulted for further management  She feels fatigued but does not appear confused. She reports some chronic right hip pain ECOG PS- 2  Pain scale- 3   Review of systems- Review of Systems  Constitutional: Positive for malaise/fatigue.  Musculoskeletal: Positive for joint pain (right hip pain).    Allergies  Allergen Reactions  . Cefuroxime Axetil Diarrhea  . Codeine Nausea Only    Intolerance instead of true allerg  . Sulfa Antibiotics Swelling and Rash    swelling    Patient Active Problem List   Diagnosis Date Noted  . Hypercalcemia 09/12/2020  . Iron deficiency anemia 09/12/2020  . Acute metabolic encephalopathy 82/42/3536  . CAD (coronary artery disease) 09/12/2020  . Hypomagnesemia 09/12/2020  . Closed right hip fracture ([place]) 01/02/2020  . Chronic anticoagulation   . Chronic deep vein thrombosis (DVT)  of proximal vein of lower extremity ([place]) 02/06/2019  . B12 deficiency 10/23/2017  . Hydronephrosis 11/18/2016  . Gout 11/18/2016  . Cervical cancer ([place]) 11/18/2016  . Carotid stenosis 04/04/2016  . Primary cancer of upper inner quadrant of right female breast ([place]) 04/04/2016  . Lymphedema 04/04/2016  . Chronic venous insufficiency 04/04/2016  . Chest tightness 02/15/2016  . Obstructive sleep apnea syndrome 08/13/2015  . SOB (shortness of breath) 12/18/2014  . Chronic fatigue 07/09/2014  . CKD (chronic kidney disease), stage IV ([place]) 05/09/2014  . Atypical chest pain 05/09/2014  . Chronic tension-type headache, intractable 03/06/2014  . Headache 02/06/2014  . Facial numbness 02/06/2014  . Rheumatoid arthritis involving multiple sites with positive rheumatoid factor ([place]) 11/01/2013  . Peripheral edema 11/01/2013  . Post herpetic neuralgia 11/01/2013  . Hyperlipidemia 11/01/2013  . HTN (hypertension) 11/01/2013  . History of recurrent UTIs 11/01/2013  . H/O: hysterectomy 11/01/2013  . GERD (gastroesophageal reflux disease) 11/01/2013  . Chronic headache 11/01/2013  . Anemia 11/01/2013     Past Medical History:  Diagnosis Date  . Anemia    is followed by renal doctor and pcp  . Arthritis   . Breast cancer ([place]) 04/2016   Rt. breast ca, f/u with radiation  . Cancer [facility]) 1961   uterine cancer  . Coronary artery disease   . Dyspnea   . GERD (gastroesophageal reflux disease)   . Hypertension   . Kidney disease    stage 4.  followed by dr. Sedonia Small  . Lower extremity edema   .  Personal history of radiation therapy   . S/P lumpectomy, right breast    having done on 04/26/16  . Sleep apnea    uses cpap     Past Surgical History:  Procedure Laterality Date  . BREAST BIOPSY Right 03/2016   invasive mammary carcinoma  . BREAST LUMPECTOMY Right    04/26/16, f/u with radiation  . CAROTID STENT Left 2016   was having pain down neck. carotid artery clogged. dr. Delana Meyer  put stent in  . CAROTID STENT Left   . CATARACT EXTRACTION W/ INTRAOCULAR LENS  IMPLANT, BILATERAL Bilateral 2009  . EYE SURGERY  2009   cataracts  . HIP PINNING,CANNULATED Right 01/04/2020   Procedure: CANNULATED HIP PINNING;  Surgeon: Thornton Park, MD;  Location: [facility] ORS;  Service: Orthopedics;  Laterality: Right;  . PARTIAL HYSTERECTOMY  1961   uterus only removed  . PARTIAL MASTECTOMY WITH NEEDLE LOCALIZATION Right 04/26/2016   Procedure: PARTIAL MASTECTOMY WITH NEEDLE LOCALIZATION;  Surgeon: Leonie Green, MD;  Location: [facility] ORS;  Service: General;  Laterality: Right;  . SENTINEL NODE BIOPSY Right 04/26/2016   Procedure: SENTINEL NODE BIOPSY;  Surgeon: Leonie Green, MD;  Location: [facility] ORS;  Service: General;  Laterality: Right;    Social History   Socioeconomic History  . Marital status: Married    Spouse name: Not on file  . Number of children: Not on file  . Years of education: Not on file  . Highest education level: Not on file  Occupational History  . Not on file  Tobacco Use  . Smoking status: Never Smoker  . Smokeless tobacco: Never Used  Substance and Sexual Activity  . Alcohol use: No  . Drug use: No  . Sexual activity: Never  Other Topics Concern  . Not on file  Social History Narrative  . Not on file   Social Determinants of Health   Financial Resource Strain: Not on file  Food Insecurity: Not on file  Transportation Needs: Not on file  Physical Activity: Not on file  Stress: Not on file  Social Connections: Not on file  Intimate Partner Violence: Not on file     Family History  Problem Relation Age of Onset  . Bladder Cancer Neg Hx   . Kidney cancer Neg Hx   . Prolactinoma Neg Hx   . Prostate cancer Neg Hx      Current Facility-Administered Medications:  .  acetaminophen (TYLENOL) tablet 1,000 mg, 1,000 mg, Oral, Once, Ivor Costa, MD .  acetaminophen (TYLENOL) tablet 650 mg, 650 mg, Oral, Q6H PRN, Ivor Costa, MD, 650 mg at  09/12/20 1152 .  [START ON 09/13/2020] acidophilus (RISAQUAD) capsule 1 capsule, 1 capsule, Oral, Daily, Ivor Costa, MD .  alum & mag hydroxide-simeth (MAALOX/MYLANTA) 200-200-20 MG/5ML suspension 30 mL, 30 mL, Oral, Q4H PRN, Ivor Costa, MD .  amLODipine (NORVASC) tablet 5 mg, 5 mg, Oral, Daily, Ivor Costa, MD, 5 mg at 09/12/20 1152 .  apixaban (ELIQUIS) tablet 2.5 mg, 2.5 mg, Oral, BID, Ivor Costa, MD, 2.5 mg at 09/12/20 1438 .  cephALEXin (KEFLEX) capsule 500 mg, 500 mg, Oral, Q12H, Ivor Costa, MD, 500 mg at 09/12/20 1439 .  docusate sodium (COLACE) capsule 100 mg, 100 mg, Oral, BID, Ivor Costa, MD, 100 mg at 09/12/20 1438 .  ferrous gluconate (FERGON) tablet 324 mg, 324 mg, Oral, Daily, Ivor Costa, MD, 324 mg at 09/12/20 1248 .  hydrALAZINE (APRESOLINE) injection 5 mg, 5 mg, Intravenous, Q2H PRN, Ivor Costa, MD .  lactated ringers infusion, , Intravenous, Continuous, Ivor Costa, MD, Last Rate: 75 mL/hr at 09/12/20 1702, New Bag at 09/12/20 1702 .  magnesium chloride (SLOW-MAG) 64 MG SR tablet 64 mg, 1 tablet, Oral, Daily, Ivor Costa, MD, 64 mg at 09/12/20 1249 .  methocarbamol (ROBAXIN) tablet 750 mg, 750 mg, Oral, TID, Ivor Costa, MD .  metoprolol tartrate (LOPRESSOR) tablet 25 mg, 25 mg, Oral, BID, Ivor Costa, MD, 25 mg at 09/12/20 1230 .  ondansetron (ZOFRAN) injection 4 mg, 4 mg, Intravenous, Q8H PRN, Ivor Costa, MD .  Derrill Memo ON 09/13/2020] pantoprazole (PROTONIX) EC tablet 40 mg, 40 mg, Oral, Daily, Ivor Costa, MD .  Derrill Memo ON 09/13/2020] pravastatin (PRAVACHOL) tablet 40 mg, 40 mg, Oral, q1800, Ivor Costa, MD .  simethicone [facility]) chewable tablet 80 mg, 80 mg, Oral, QID PRN, Ivor Costa, MD, 80 mg at 09/12/20 1247 .  [START ON 09/13/2020] tamoxifen (NOLVADEX) tablet 20 mg, 20 mg, Oral, Daily, Ivor Costa, MD   Physical exam:  Vitals:   09/12/20 0818 09/12/20 0819 09/12/20 0830 09/12/20 1146  BP: (!) 156/76  120/90 (!) 178/65  Pulse: 70  72 66  Resp: 14  15 18   Temp:  97.9 F (36.6 C)   97.6 F (36.4 C)  TempSrc:  Oral    SpO2: 96%  96% 100%  Weight:      Height:       Physical Exam Constitutional:      General: She is not in acute distress.    Comments: She is frail appearing  Cardiovascular:     Rate and Rhythm: Normal rate and regular rhythm.     Heart sounds: Normal heart sounds.  Pulmonary:     Effort: Pulmonary effort is normal.     Breath sounds: Normal breath sounds.  Abdominal:     General: Bowel sounds are normal.     Palpations: Abdomen is soft.  Skin:    General: Skin is warm and dry.  Neurological:     Mental Status: She is alert and oriented to person, place, and time.        CMP Latest Ref Rng & Units 09/12/2020  Glucose 70 - 99 mg/dL 99  BUN 8 - 23 mg/dL 36(H)  Creatinine 0.44 - 1.00 mg/dL 1.79(H)  Sodium 135 - 145 mmol/L 137  Potassium 3.5 - 5.1 mmol/L 4.7  Chloride 98 - 111 mmol/L 104  CO2 22 - 32 mmol/L 24  Calcium 8.9 - 10.3 mg/dL 13.1(HH)  Total Protein 6.5 - 8.1 g/dL 6.1(L)  Total Bilirubin 0.3 - 1.2 mg/dL 0.7  Alkaline Phos 38 - 126 U/L 34(L)  AST 15 - 41 U/L 18  ALT 0 - 44 U/L 6   CBC Latest Ref Rng & Units 09/12/2020  WBC 4.0 - 10.5 K/uL 6.5  Hemoglobin 12.0 - 15.0 g/dL 10.7(L)  Hematocrit 36.0 - 46.0 % 32.8(L)  Platelets 150 - 400 K/uL 144(L)    @IMAGES @  CT Head Wo Contrast  Result Date: 09/12/2020 CLINICAL DATA:  Mental status change. EXAM: CT HEAD WITHOUT CONTRAST TECHNIQUE: Contiguous axial images were obtained from the base of the skull through the vertex without intravenous contrast. COMPARISON:  01/01/2020 FINDINGS: Exam is somewhat limited by patient motion. Brain: Stable age related cerebral atrophy, ventriculomegaly and periventricular white matter disease. No extra-axial fluid collections are identified. No CT findings for acute hemispheric infarction or intracranial hemorrhage. No mass lesions. The brainstem and cerebellum are normal. Vascular: Stable vascular calcifications. No definite aneurysm or  hyperdense  vessels. Skull: No fracture or bone lesions. Sinuses/Orbits: Paranasal sinuses and mastoid air cells are clear. The globes are intact. Other: No scalp lesions or scalp hematoma. IMPRESSION: 1. Stable age related cerebral atrophy, ventriculomegaly and periventricular white matter disease. 2. No acute intracranial findings or mass lesions. Electronically Signed   By: Marijo Sanes M.D.   On: 09/12/2020 09:02   DG Chest Portable 1 View  Result Date: 09/12/2020 CLINICAL DATA:  Confusion. Altered mental status. Personal history of breast carcinoma and coronary artery disease. EXAM: PORTABLE CHEST 1 VIEW COMPARISON:  01/06/2020 FINDINGS: Heart size is normal. Aortic atherosclerotic calcification noted. A moderate hiatal hernia is again noted. Both lungs are clear. IMPRESSION: No active cardiopulmonary disease. Moderate hiatal hernia. Electronically Signed   By: Marlaine Hind M.D.   On: 09/12/2020 08:41    Assessment and plan- Patient is a 87 y.o. female with history of stage I ER positive right breast cancer diagnosed in 2018 and currently on tamoxifen admitted for hypercalcemia  Hypercalcemia: Etiology unclear if related to malignancy versus other causes.  Patient is currently on maintenance IV fluids at 75 cc an hour and I would recommend increasing the rate 100- 125 cc an hour for aggressive hydration.  She is already received a dose of pamidronate and work-up for hypercalcemia has been initiated.  Given her baseline CKD, it would be okay to do a CT chest abdomen and pelvis without contrast to see if there is any evidence of metastatic disease.  History of breast cancer: Okay to hold tamoxifen as an inpatient    Visit Diagnosis 1. Hypercalcemia   2. Confusion   3. Myalgia     Dr. Randa Evens, MD, MPH [facility] [phone] 09/12/2020 10:43 PM

## 2020-09-12 NOTE — ED Provider Notes (Signed)
[facility] Emergency Department Provider Note ____________________________________________   Event Date/Time   First MD Initiated Contact with Patient 09/12/20 [phone]     (approximate)  I have reviewed the triage vital signs and the nursing notes.  HISTORY  Chief Complaint Altered Mental Status   HPI Rachel Jones is a 87 y.o. femalewho presents to the ED for evaluation of possible UTI.  Chart review indicates hx CKD 4, DVT on Eliquis, frequent UTIs.  Patient presents to the ED via EMS from her local SNF, accompanied by her daughter, for evaluation of acute generalized decline in functional status over the past 24 to 48 hours.  Daughter reports that they checked her urine recently and she was started on Macrobid yesterday to treat acute cystitis.  Daughter reports seeing the patient last night, and then again this morning, as she is significantly weaker this morning and concerned about rapid decline in her functional status.  Patient reports feeling poor all over and generalized fashion, reports diffuse myalgias and atraumatic back pain.  Denies falls or injuries.   Past Medical History:  Diagnosis Date  . Anemia    is followed by renal doctor and pcp  . Arthritis   . Breast cancer ([place]) 04/2016   Rt. breast ca, f/u with radiation  . Cancer [facility]) 1961   uterine cancer  . Coronary artery disease   . Dyspnea   . GERD (gastroesophageal reflux disease)   . Hypertension   . Kidney disease    stage 4.  followed by dr. Sedonia Small  . Lower extremity edema   . Personal history of radiation therapy   . S/P lumpectomy, right breast    having done on 04/26/16  . Sleep apnea    uses cpap    Patient Active Problem List   Diagnosis Date Noted  . Hypercalcemia 09/12/2020  . Iron deficiency anemia 09/12/2020  . Acute metabolic encephalopathy 48/54/6270  . CAD (coronary artery disease) 09/12/2020  . Closed right hip fracture ([place]) 01/02/2020  . Chronic  anticoagulation   . Chronic deep vein thrombosis (DVT) of proximal vein of lower extremity ([place]) 02/06/2019  . B12 deficiency 10/23/2017  . Hydronephrosis 11/18/2016  . Gout 11/18/2016  . Cervical cancer ([place]) 11/18/2016  . Carotid stenosis 04/04/2016  . Primary cancer of upper inner quadrant of right female breast ([place]) 04/04/2016  . Lymphedema 04/04/2016  . Chronic venous insufficiency 04/04/2016  . Chest tightness 02/15/2016  . Obstructive sleep apnea syndrome 08/13/2015  . SOB (shortness of breath) 12/18/2014  . Chronic fatigue 07/09/2014  . CKD (chronic kidney disease), stage IV ([place]) 05/09/2014  . Atypical chest pain 05/09/2014  . Chronic tension-type headache, intractable 03/06/2014  . Headache 02/06/2014  . Facial numbness 02/06/2014  . Rheumatoid arthritis involving multiple sites with positive rheumatoid factor ([place]) 11/01/2013  . Peripheral edema 11/01/2013  . Post herpetic neuralgia 11/01/2013  . Hyperlipidemia 11/01/2013  . HTN (hypertension) 11/01/2013  . History of recurrent UTIs 11/01/2013  . H/O: hysterectomy 11/01/2013  . GERD (gastroesophageal reflux disease) 11/01/2013  . Chronic headache 11/01/2013  . Anemia 11/01/2013    Past Surgical History:  Procedure Laterality Date  . BREAST BIOPSY Right 03/2016   invasive mammary carcinoma  . BREAST LUMPECTOMY Right    04/26/16, f/u with radiation  . CAROTID STENT Left 2016   was having pain down neck. carotid artery clogged. dr. Delana Meyer put stent in  . CAROTID STENT Left   . CATARACT EXTRACTION W/ INTRAOCULAR LENS  IMPLANT, BILATERAL Bilateral 2009  . EYE SURGERY  2009   cataracts  . HIP PINNING,CANNULATED Right 01/04/2020   Procedure: CANNULATED HIP PINNING;  Surgeon: Thornton Park, MD;  Location: [facility] ORS;  Service: Orthopedics;  Laterality: Right;  . PARTIAL HYSTERECTOMY  1961   uterus only removed  . PARTIAL MASTECTOMY WITH NEEDLE LOCALIZATION Right 04/26/2016   Procedure: PARTIAL MASTECTOMY WITH NEEDLE  LOCALIZATION;  Surgeon: Leonie Green, MD;  Location: [facility] ORS;  Service: General;  Laterality: Right;  . SENTINEL NODE BIOPSY Right 04/26/2016   Procedure: SENTINEL NODE BIOPSY;  Surgeon: Leonie Green, MD;  Location: [facility] ORS;  Service: General;  Laterality: Right;    Prior to Admission medications   Medication Sig Start Date End Date Taking? Authorizing Provider  acetaminophen (TYLENOL) 500 MG tablet Take 1,000 mg by mouth daily. May take an additional 1000 mgs as needed for pain    [provider]  alum & mag hydroxide-simeth (MAALOX/MYLANTA) 200-200-20 MG/5ML suspension Take 30 mLs by mouth every 4 (four) hours as needed for indigestion. 01/07/20   Sreenath, Trula Slade, MD  apixaban (ELIQUIS) 2.5 MG TABS tablet Take 1 tablet by mouth 2 (two) times a day. 12/09/18   [provider]  calcium-vitamin D (OSCAL WITH D) 500-200 MG-UNIT tablet Take 1 tablet by mouth 2 (two) times daily. 09/01/16   Lloyd Huger, MD  Cholecalciferol (VITAMIN D3) 1000 units CAPS Take 1,000 Units by mouth daily.     [provider]  Cyanocobalamin (VITAMIN B-12 IJ) Inject 1,000 mcg as directed every 30 (thirty) days.    [provider]  docusate sodium (STOOL SOFTENER) 100 MG capsule Take 100 mg by mouth 2 (two) times daily.    [provider]  esomeprazole (NEXIUM) 40 MG capsule Take 40 mg by mouth daily.    [provider]  ferrous gluconate (FERGON) 324 MG tablet Take 324 mg by mouth daily.    [provider]  furosemide (LASIX) 20 MG tablet Take 20 mg by mouth daily as needed for edema.     [provider]  lovastatin (MEVACOR) 40 MG tablet Take 40 mg by mouth 2 (two) times daily.    [provider]  methocarbamol (ROBAXIN) 750 MG tablet Take 1 tablet (750 mg total) by mouth 3 (three) times daily. 01/07/20   Sidney Ace, MD  metoprolol tartrate (LOPRESSOR) 25 MG tablet Take 25 mg by mouth 2 (two) times daily.  11/20/19   [provider]  Probiotic Product (PROBIOTIC ADVANCED) CAPS Take 1 capsule by mouth daily.     [provider]  risedronate (ACTONEL) 35 MG tablet TAKE 1 TABLET EVERY 14 DAYS Patient taking differently: Take 35 mg by mouth every 14 (fourteen) days.  03/14/19   Lloyd Huger, MD  simethicone (MYLICON) 80 MG chewable tablet Chew 1 tablet (80 mg total) by mouth 4 (four) times daily as needed for flatulence. 01/07/20   Sidney Ace, MD  tamoxifen (NOLVADEX) 20 MG tablet TAKE 1 TABLET (20 MG TOTAL) BY MOUTH DAILY. 07/05/19   Lloyd Huger, MD    Allergies Cefuroxime axetil, Codeine, and Sulfa antibiotics  Family History  Problem Relation Age of Onset  . Bladder Cancer Neg Hx   . Kidney cancer Neg Hx   . Prolactinoma Neg Hx   . Prostate cancer Neg Hx     Social History Social History   Tobacco Use  . Smoking status: Never Smoker  . Smokeless tobacco: Never  Used  Substance Use Topics  . Alcohol use: No  . Drug use: No    Review of Systems  Constitutional: No fever/chills.  Positive for generalized weakness Eyes: No visual changes. ENT: No sore throat. Cardiovascular: Denies chest pain. Respiratory: Denies shortness of breath. Gastrointestinal: No abdominal pain.  No nausea, no vomiting.  No diarrhea.  No constipation. Genitourinary: Negative for dysuria. Musculoskeletal: Positive for atraumatic lower back pain and diffuse myalgias Skin: Negative for rash. Neurological: Negative for headaches, focal weakness or numbness.  ____________________________________________   PHYSICAL EXAM:  VITAL SIGNS: Vitals:   09/12/20 0819 09/12/20 0830  BP:  120/90  Pulse:  72  Resp:  15  Temp: 97.9 F (36.6 C)   SpO2:  96%     Constitutional: Alert and oriented to year, location, situation and self.  Chronically ill-appearing.  Answers questions and follows commands.  Requires 2 person assist to sit her forward with her back due to  weakness Eyes: Conjunctivae are normal. PERRL. EOMI. Head: Atraumatic. Nose: No congestion/rhinnorhea. Mouth/Throat: Mucous membranes are dry.  Oropharynx non-erythematous. Neck: No stridor. No cervical spine tenderness to palpation. Cardiovascular: Normal rate, regular rhythm. Grossly normal heart sounds.  Good peripheral circulation. Respiratory: Normal respiratory effort.  No retractions. Lungs CTAB. Gastrointestinal: Soft , nondistended, right-sided CVA tenderness.  Musculoskeletal: No lower extremity tenderness nor edema.  No joint effusions. No signs of acute trauma. No signs of trauma to the back, no spinal step-offs.  Point localizing lumbar tenderness seems localized in her right CVA paraspinally.  No point bony tenderness to the spine throughout. Neurologic:  Normal speech and language. No gross focal neurologic deficits are appreciated.  Skin:  Skin is warm, dry and intact. No rash noted. Psychiatric: Mood and affect are normal. Speech and behavior are normal.  ____________________________________________   LABS (all labs ordered are listed, but only abnormal results are displayed)  Labs Reviewed  CBC WITH DIFFERENTIAL/PLATELET - Abnormal; Notable for the following components:      Result Value   RBC 3.43 (*)    Hemoglobin 10.7 (*)    HCT 32.8 (*)    Platelets 144 (*)    All other components within normal limits  COMPREHENSIVE METABOLIC PANEL - Abnormal; Notable for the following components:   BUN 36 (*)    Creatinine, Ser 1.79 (*)    Calcium 13.1 (*)    Total Protein 6.1 (*)    Albumin 3.2 (*)    Alkaline Phosphatase 34 (*)    GFR, Estimated 27 (*)    All other components within normal limits  URINALYSIS, COMPLETE (UACMP) WITH MICROSCOPIC - Abnormal; Notable for the following components:   Color, Urine YELLOW (*)    APPearance CLEAR (*)    All other components within normal limits  SARS CORONAVIRUS 2 (TAT 6-24 HRS)  URINE CULTURE  BRAIN NATRIURETIC PEPTIDE   MAGNESIUM  PHOSPHORUS  PARATHYROID HORMONE, INTACT (NO CA)  PTH-RELATED PEPTIDE  CALCITRIOL (1,25 DI-OH VIT D)  TROPONIN I (HIGH SENSITIVITY)   ____________________________________________  12 Lead EKG  Sinus rhythm, rate of 68 bpm.  Normal axis and intervals.  No evidence of acute ischemia. ____________________________________________  RADIOLOGY  ED MD interpretation: CT head reviewed by me without evidence of acute ICH. CXR reviewed by me without evidence of acute cardiopulmonary pathology.  Official radiology report(s): CT Head Wo Contrast  Result Date: 09/12/2020 CLINICAL DATA:  Mental status change. EXAM: CT HEAD WITHOUT CONTRAST TECHNIQUE: Contiguous axial images were obtained from the base of the  skull through the vertex without intravenous contrast. COMPARISON:  01/01/2020 FINDINGS: Exam is somewhat limited by patient motion. Brain: Stable age related cerebral atrophy, ventriculomegaly and periventricular white matter disease. No extra-axial fluid collections are identified. No CT findings for acute hemispheric infarction or intracranial hemorrhage. No mass lesions. The brainstem and cerebellum are normal. Vascular: Stable vascular calcifications. No definite aneurysm or hyperdense vessels. Skull: No fracture or bone lesions. Sinuses/Orbits: Paranasal sinuses and mastoid air cells are clear. The globes are intact. Other: No scalp lesions or scalp hematoma. IMPRESSION: 1. Stable age related cerebral atrophy, ventriculomegaly and periventricular white matter disease. 2. No acute intracranial findings or mass lesions. Electronically Signed   By: Marijo Sanes M.D.   On: 09/12/2020 09:02   DG Chest Portable 1 View  Result Date: 09/12/2020 CLINICAL DATA:  Confusion. Altered mental status. Personal history of breast carcinoma and coronary artery disease. EXAM: PORTABLE CHEST 1 VIEW COMPARISON:  01/06/2020 FINDINGS: Heart size is normal. Aortic atherosclerotic calcification noted. A  moderate hiatal hernia is again noted. Both lungs are clear. IMPRESSION: No active cardiopulmonary disease. Moderate hiatal hernia. Electronically Signed   By: Marlaine Hind M.D.   On: 09/12/2020 08:41    ____________________________________________   PROCEDURES and INTERVENTIONS  Procedure(s) performed (including Critical Care):  .1-3 Lead EKG Interpretation Performed by: Vladimir Crofts, MD Authorized by: Vladimir Crofts, MD     Interpretation: normal     ECG rate:  70   ECG rate assessment: normal     Rhythm: sinus rhythm     Ectopy: none     Conduction: normal   .Critical Care Performed by: Vladimir Crofts, MD Authorized by: Vladimir Crofts, MD   Critical care provider statement:    Critical care time (minutes):  45   Critical care was necessary to treat or prevent imminent or life-threatening deterioration of the following conditions:  Metabolic crisis   Critical care was time spent personally by me on the following activities:  Discussions with consultants, evaluation of patient's response to treatment, examination of patient, ordering and performing treatments and interventions, ordering and review of laboratory studies, ordering and review of radiographic studies, pulse oximetry, re-evaluation of patient's condition, obtaining history from patient or surrogate and review of old charts    Medications  acetaminophen (TYLENOL) tablet 1,000 mg (1,000 mg Oral Not Given 09/12/20 0916)  lactated ringers infusion (has no administration in time range)  pamidronate (AREDIA) 90 mg in sodium chloride 0.9 % 500 mL IVPB (has no administration in time range)  ondansetron (ZOFRAN) injection 4 mg (has no administration in time range)  hydrALAZINE (APRESOLINE) injection 5 mg (has no administration in time range)  acetaminophen (TYLENOL) tablet 650 mg (has no administration in time range)  lactated ringers bolus 500 mL (500 mLs Intravenous New Bag/Given 09/12/20 0907)     ____________________________________________   MDM / ED COURSE   87 year old woman presents to the ED from local SNF due to myalgias and confusion, with concern for UTI and without evidence of such, but found to have severe hypercalcemia requiring admission.  Normal vitals on room air.  Exam is nonfocal with patient groaning and complaining of atraumatic lumbar pain.  This is poorly localizing and I have some concern for CVA tenderness, her urinalysis is reassuring without evidence of infection and I doubt pyelonephritis.  Blood work with stable CKD, but severe hypercalcemia of uncertain etiology.  I see she has a history of breast cancer in the past.  Initiated fluid resuscitation and her work-up  is otherwise benign.  We will admit to hospitalist for further work-up and management.  Clinical Course as of 09/12/20 0948  Sat Sep 12, 2020  [id number] Discussed blood work results with patient and daughter.  We discussed admission for hypercalcemia.  They are agreeable. [DS]    Clinical Course User Index [DS] Vladimir Crofts, MD    ____________________________________________   FINAL CLINICAL IMPRESSION(S) / ED DIAGNOSES  Final diagnoses:  Hypercalcemia  Confusion  Myalgia     ED Discharge Orders    None       Mylinda Brook Tamala Julian   Note:  This document was prepared using Dragon voice recognition software and may include unintentional dictation errors.   Vladimir Crofts, MD 09/12/20 [phone]

## 2020-09-12 NOTE — ED Notes (Signed)
Pt transported to CT at this time.

## 2020-09-12 NOTE — ED Notes (Signed)
Dr. Tamala Julian at bedside for reevaluation.

## 2020-09-12 NOTE — H&P (Addendum)
History and Physical    Rachel Jones IRJ:[id number] DOB: 1934-01-23 DOA: 09/12/2020  Referring MD/NP/PA:   PCP: Idelle Crouch, MD   Patient coming from:  The patient is coming from SNF.  At baseline, pt is dependent for most of ADL.        Chief Complaint: AMS and weakness  HPI: Rachel Jones is a 87 y.o. female with medical history significant of cervical uterine cancer (s/p for hysterectomy), breast cancer (s/p of partial mastectomy, lumpectomy, radiation therapy), HTN, HLD, GERD, gout, OSA not on CPAP, DVT on Eliquis, RA, carotid artery stenosis, CKD stage IV, CAD, anemia, who presents with altered mental status and weakness.  per her daughter at bedside, pt normally is oriented x3.  She has been confused since yesterday.  She has generalized weakness, myalgias and shaking. No fever.  No facial droop or slurred speech.  Patient does not have chest pain, shortness breath, cough.  No nausea vomiting, diarrhea or abdominal pain.  Patient has dysuria and burning on urination recently, patient was started on Macrobid since yesterday in facility after checking her urine. No fall or any injury. When I saw pt in ED, she is mildly confused, but still orientated x3.  She moves all extremities.   ED Course: pt was found to have calcium 13.1, potassium 6.5, troponin level 14, negative urinalysis, pending COVID-19 PCR, stable renal function, temperature normal, blood pressure 180/137, heart rate 72, RR 15, oxygen saturation 96% on room air.  Chest x-ray negative for infiltration, but showed moderate hiatal hernia.  CT of head is negative for acute intracranial abnormalities.  Patient is admitted to [place] bed as inpatient.  Consulted oncologist.  Review of Systems:   General: no fevers, has chills, no body weight gain, has fatigue HEENT: no blurry vision, hearing changes or sore throat Respiratory: no dyspnea, coughing, wheezing CV: no chest pain, no palpitations GI: no nausea, vomiting,  abdominal pain, diarrhea, constipation GU: has dysuria, burning on urination, no hematuria  Ext: no leg edema Neuro: no unilateral weakness, numbness, or tingling, no vision change or hearing loss. Has AMS Skin: no rash, no skin tear. MSK: No muscle spasm, no deformity, no limitation of range of movement in spin Heme: No easy bruising.  Travel history: No recent long distant travel.  Allergy:  Allergies  Allergen Reactions  . Cefuroxime Axetil Diarrhea  . Codeine Nausea Only    Intolerance instead of true allerg  . Sulfa Antibiotics Swelling and Rash    swelling    Past Medical History:  Diagnosis Date  . Anemia    is followed by renal doctor and pcp  . Arthritis   . Breast cancer ([place]) 04/2016   Rt. breast ca, f/u with radiation  . Cancer [facility]) 1961   uterine cancer  . Coronary artery disease   . Dyspnea   . GERD (gastroesophageal reflux disease)   . Hypertension   . Kidney disease    stage 4.  followed by dr. Sedonia Small  . Lower extremity edema   . Personal history of radiation therapy   . S/P lumpectomy, right breast    having done on 04/26/16  . Sleep apnea    uses cpap    Past Surgical History:  Procedure Laterality Date  . BREAST BIOPSY Right 03/2016   invasive mammary carcinoma  . BREAST LUMPECTOMY Right    04/26/16, f/u with radiation  . CAROTID STENT Left 2016   was having pain down neck. carotid artery clogged.  dr. Delana Meyer put stent in  . CAROTID STENT Left   . CATARACT EXTRACTION W/ INTRAOCULAR LENS  IMPLANT, BILATERAL Bilateral 2009  . EYE SURGERY  2009   cataracts  . HIP PINNING,CANNULATED Right 01/04/2020   Procedure: CANNULATED HIP PINNING;  Surgeon: Thornton Park, MD;  Location: [facility] ORS;  Service: Orthopedics;  Laterality: Right;  . PARTIAL HYSTERECTOMY  1961   uterus only removed  . PARTIAL MASTECTOMY WITH NEEDLE LOCALIZATION Right 04/26/2016   Procedure: PARTIAL MASTECTOMY WITH NEEDLE LOCALIZATION;  Surgeon: Leonie Green, MD;  Location:  [facility] ORS;  Service: General;  Laterality: Right;  . SENTINEL NODE BIOPSY Right 04/26/2016   Procedure: SENTINEL NODE BIOPSY;  Surgeon: Leonie Green, MD;  Location: [facility] ORS;  Service: General;  Laterality: Right;    Social History:  reports that she has never smoked. She has never used smokeless tobacco. She reports that she does not drink alcohol and does not use drugs.  Family History:  Family History  Problem Relation Age of Onset  . Bladder Cancer Neg Hx   . Kidney cancer Neg Hx   . Prolactinoma Neg Hx   . Prostate cancer Neg Hx      Prior to Admission medications   Medication Sig Start Date End Date Taking? Authorizing Provider  acetaminophen (TYLENOL) 500 MG tablet Take 1,000 mg by mouth daily. May take an additional 1000 mgs as needed for pain    [provider]  alum & mag hydroxide-simeth (MAALOX/MYLANTA) 200-200-20 MG/5ML suspension Take 30 mLs by mouth every 4 (four) hours as needed for indigestion. 01/07/20   Sreenath, Trula Slade, MD  apixaban (ELIQUIS) 2.5 MG TABS tablet Take 1 tablet by mouth 2 (two) times a day. 12/09/18   [provider]  calcium-vitamin D (OSCAL WITH D) 500-200 MG-UNIT tablet Take 1 tablet by mouth 2 (two) times daily. 09/01/16   Lloyd Huger, MD  Cholecalciferol (VITAMIN D3) 1000 units CAPS Take 1,000 Units by mouth daily.     [provider]  Cyanocobalamin (VITAMIN B-12 IJ) Inject 1,000 mcg as directed every 30 (thirty) days.    [provider]  docusate sodium (STOOL SOFTENER) 100 MG capsule Take 100 mg by mouth 2 (two) times daily.    [provider]  esomeprazole (NEXIUM) 40 MG capsule Take 40 mg by mouth daily.    [provider]  ferrous gluconate (FERGON) 324 MG tablet Take 324 mg by mouth daily.    [provider]  furosemide (LASIX) 20 MG tablet Take 20 mg by mouth daily as needed for edema.     [provider]  lovastatin (MEVACOR) 40 MG tablet Take 40 mg by mouth  2 (two) times daily.    [provider]  methocarbamol (ROBAXIN) 750 MG tablet Take 1 tablet (750 mg total) by mouth 3 (three) times daily. 01/07/20   Sidney Ace, MD  metoprolol tartrate (LOPRESSOR) 25 MG tablet Take 25 mg by mouth 2 (two) times daily. 11/20/19   [provider]  Probiotic Product (PROBIOTIC ADVANCED) CAPS Take 1 capsule by mouth daily.     [provider]  risedronate (ACTONEL) 35 MG tablet TAKE 1 TABLET EVERY 14 DAYS Patient taking differently: Take 35 mg by mouth every 14 (fourteen) days.  03/14/19   Lloyd Huger, MD  simethicone (MYLICON) 80 MG chewable tablet Chew 1 tablet (80 mg total) by mouth 4 (four) times daily as needed for flatulence. 01/07/20   Sidney Ace, MD  tamoxifen (NOLVADEX) 20 MG tablet TAKE 1 TABLET (20 MG TOTAL) BY MOUTH DAILY. 07/05/19   Lloyd Huger, MD    Physical Exam: Vitals:   09/12/20 2637 09/12/20 0818 09/12/20 0819 09/12/20 0830  BP:  (!) 156/76  120/90  Pulse:  70  72  Resp:  14  15  Temp:   97.9 F (36.6 C)   TempSrc:   Oral   SpO2:  96%  96%  Weight: 69.1 kg     Height: 5\' 4"  (1.626 m)      General: Not in acute distress HEENT:       Eyes: PERRL, EOMI, no scleral icterus.       ENT: No discharge from the ears and nose, no pharynx injection, no tonsillar enlargement.        Neck: No JVD, no bruit, no mass felt. Heme: No neck lymph node enlargement. Cardiac: S1/S2, RRR, No murmurs, No gallops or rubs. Respiratory: No rales, wheezing, rhonchi or rubs. GI: Soft, nondistended, nontender, no organomegaly, BS present. GU: No hematuria Ext: No pitting leg edema bilaterally. 1+DP/PT pulse bilaterally. Musculoskeletal: No joint deformities, No joint redness or warmth, no limitation of ROM in spin. Skin: No rashes.  Neuro: Mildly confused, still oriented X3, cranial nerves II-XII grossly intact, moves all extremities normally. Psych: Patient is not psychotic, no suicidal or hemocidal  ideation.  Labs on Admission: I have personally reviewed following labs and imaging studies  CBC: Recent Labs  Lab 09/12/20 0820  WBC 6.5  NEUTROABS 4.3  HGB 10.7*  HCT 32.8*  MCV 95.6  PLT 858*   Basic Metabolic Panel: Recent Labs  Lab 09/12/20 0820  NA 137  K 4.7  CL 104  CO2 24  GLUCOSE 99  BUN 36*  CREATININE 1.79*  CALCIUM 13.1*  MG 1.0*  PHOS 2.7   GFR: Estimated Creatinine Clearance: 21.9 mL/min (A) (by C-G formula based on SCr of 1.79 mg/dL (H)). Liver Function Tests: Recent Labs  Lab 09/12/20 0820  AST 18  ALT 6  ALKPHOS 34*  BILITOT 0.7  PROT 6.1*  ALBUMIN 3.2*   No results for input(s): LIPASE, AMYLASE in the last 168 hours. No results for input(s): AMMONIA in the last 168 hours. Coagulation Profile: No results for input(s): INR, PROTIME in the last 168 hours. Cardiac Enzymes: No results for input(s): CKTOTAL, CKMB, CKMBINDEX, TROPONINI in the last 168 hours. BNP (last 3 results) No results for input(s): PROBNP in the last 8760 hours. HbA1C: No results for input(s): HGBA1C in the last 72 hours. CBG: No results for input(s): GLUCAP in the last 168 hours. Lipid Profile: No results for input(s): CHOL, HDL, LDLCALC, TRIG, CHOLHDL, LDLDIRECT in the last 72 hours. Thyroid Function Tests: No results for input(s): TSH, T4TOTAL, FREET4, T3FREE, THYROIDAB in the last 72 hours. Anemia Panel: No results for input(s): VITAMINB12, FOLATE, FERRITIN, TIBC, IRON, RETICCTPCT in the last 72 hours. Urine analysis:    Component Value Date/Time   COLORURINE YELLOW (A) 09/12/2020 0820   APPEARANCEUR CLEAR (A) 09/12/2020 0820   APPEARANCEUR Clear 12/19/2016 1404   LABSPEC 1.014 09/12/2020 0820   PHURINE 5.0 09/12/2020 0820   GLUCOSEU NEGATIVE 09/12/2020 0820   HGBUR NEGATIVE 09/12/2020 0820   BILIRUBINUR NEGATIVE 09/12/2020 0820   BILIRUBINUR Negative 12/19/2016 1404   KETONESUR NEGATIVE 09/12/2020 0820   PROTEINUR NEGATIVE 09/12/2020 0820   NITRITE  NEGATIVE 09/12/2020 0820   LEUKOCYTESUR NEGATIVE 09/12/2020 0820   Sepsis Labs: @LABRCNTIP (procalcitonin:4,lacticidven:4) )No results found for this or any  previous visit (from the past 240 hour(s)).   Radiological Exams on Admission: CT Head Wo Contrast  Result Date: 09/12/2020 CLINICAL DATA:  Mental status change. EXAM: CT HEAD WITHOUT CONTRAST TECHNIQUE: Contiguous axial images were obtained from the base of the skull through the vertex without intravenous contrast. COMPARISON:  01/01/2020 FINDINGS: Exam is somewhat limited by patient motion. Brain: Stable age related cerebral atrophy, ventriculomegaly and periventricular white matter disease. No extra-axial fluid collections are identified. No CT findings for acute hemispheric infarction or intracranial hemorrhage. No mass lesions. The brainstem and cerebellum are normal. Vascular: Stable vascular calcifications. No definite aneurysm or hyperdense vessels. Skull: No fracture or bone lesions. Sinuses/Orbits: Paranasal sinuses and mastoid air cells are clear. The globes are intact. Other: No scalp lesions or scalp hematoma. IMPRESSION: 1. Stable age related cerebral atrophy, ventriculomegaly and periventricular white matter disease. 2. No acute intracranial findings or mass lesions. Electronically Signed   By: Marijo Sanes M.D.   On: 09/12/2020 09:02   DG Chest Portable 1 View  Result Date: 09/12/2020 CLINICAL DATA:  Confusion. Altered mental status. Personal history of breast carcinoma and coronary artery disease. EXAM: PORTABLE CHEST 1 VIEW COMPARISON:  01/06/2020 FINDINGS: Heart size is normal. Aortic atherosclerotic calcification noted. A moderate hiatal hernia is again noted. Both lungs are clear. IMPRESSION: No active cardiopulmonary disease. Moderate hiatal hernia. Electronically Signed   By: Marlaine Hind M.D.   On: 09/12/2020 08:41     EKG: I have personally reviewed.  Sinus rhythm, QTC 393, low voltage, nonspecific T wave  change   Assessment/Plan Principal Problem:   Hypercalcemia Active Problems:   Primary cancer of upper inner quadrant of right female breast (HCC)   Hyperlipidemia   HTN (hypertension)   GERD (gastroesophageal reflux disease)   CKD (chronic kidney disease), stage IV (HCC)   Cervical cancer (HCC)   Chronic deep vein thrombosis (DVT) of proximal vein of lower extremity (HCC)   Iron deficiency anemia   Acute metabolic encephalopathy   CAD (coronary artery disease)   Hypomagnesemia   Hypercalcemia: Calcium 13.1.  Patient has a confusion.  Etiology is not clear.  Patient is on calcium and vitamin D supplement, which may have contributed partially.  Another potential differential diagnosis is cancer related hypercalcemia. Consulted Dr. Janese Banks of oncology.  -will admit to med-surg bed as inpt -Pamidronate 90 mg IV 1 dose - IVF: 500 cc of LR, then 75 cc/h - give IV Pamidronate 91 mg now - hold calcium and vitamin D supplement -Check magnesium and phosphorus level -PTH and PTH related peptide, vitamin D 1-25 and urine calcium level  Addendum: Hypomagnesemia with Mg 1.0 and Phosphorus 2.7 -will give 3 g of magnesium sulfate  Primary cancer of upper inner quadrant of right female breast (HCC) and hx of cervical cancer: -consulted Dr. Janese Banks of oncology -continue tamoxifen for breast cancer  Hyperlipidemia -pravastatin  HTN (hypertension): Blood pressure 180/137 -IV hydralazine as needed -Hold Lasix since patient need IV fluid -Continue metoprolol -Start amlodipine 5 mg daily  GERD (gastroesophageal reflux disease) -Protonix  CKD (chronic kidney disease), stage IV ([place]): Stable.  Recent baseline creatinine 1.7-2.0.  Her creatinine is 1.79, BUN 36 today -Follow-up by BMP  Chronic deep vein thrombosis (DVT) of proximal vein of lower extremity (HCC) -Continue Eliquis  Iron deficiency anemia: Hemoglobin 10.7 (7.4 01/07/2020) -Continue iron supplement  CAD (coronary artery  disease): no chest pain -pravastatin  Acute metabolic encephalopathy: Likely due to hypercalcemia.  CT head negative for acute intracranial abnormality. -  Frequent neuro check -Treat hypercalcemia as above  Possible UTI: Patient was started on Macrobid yesterday in facility for possible UTI.  Today urinalysis is negative which may be due to antibiotics use. Patient has symptoms of dysuria and burning on urination -will change Macrobid to Keflex -Follow-up urine culture           DVT ppx: On Eliquis Code Status: DNR per her daughter Family Communication: Yes, patient's daughter at bed side Disposition Plan:  Anticipate discharge back to previous environment Consults called: oncologist, Dr. Janese Banks Admission status and Level of care: Med-Surg:  as inpt       Status is: Inpatient  Remains inpatient appropriate because:Inpatient level of care appropriate due to severity of illness   Dispo: The patient is from: SNF              Anticipated d/c is to: SNF              Patient currently is not medically stable to d/c.   Difficult to place patient No          Date of Service 09/12/2020    [facility]   If 7PM-7AM, please contact night-coverage [email or web address] 09/12/2020, 11:37 AM

## 2020-09-13 LAB — CBC
HCT: 31.2 % — ABNORMAL LOW (ref 36.0–46.0)
Hemoglobin: 10.2 g/dL — ABNORMAL LOW (ref 12.0–15.0)
MCH: 31.3 pg (ref 26.0–34.0)
MCHC: 32.7 g/dL (ref 30.0–36.0)
MCV: 95.7 fL (ref 80.0–100.0)
Platelets: 134 10*3/uL — ABNORMAL LOW (ref 150–400)
RBC: 3.26 MIL/uL — ABNORMAL LOW (ref 3.87–5.11)
RDW: 12.4 % (ref 11.5–15.5)
WBC: 7.2 10*3/uL (ref 4.0–10.5)
nRBC: 0 % (ref 0.0–0.2)

## 2020-09-13 LAB — BASIC METABOLIC PANEL
Anion gap: 7 (ref 5–15)
BUN: 30 mg/dL — ABNORMAL HIGH (ref 8–23)
CO2: 22 mmol/L (ref 22–32)
Calcium: 11.4 mg/dL — ABNORMAL HIGH (ref 8.9–10.3)
Chloride: 109 mmol/L (ref 98–111)
Creatinine, Ser: 1.44 mg/dL — ABNORMAL HIGH (ref 0.44–1.00)
GFR, Estimated: 36 mL/min — ABNORMAL LOW (ref >60)
Glucose, Bld: 93 mg/dL (ref 70–99)
Potassium: 4.5 mmol/L (ref 3.5–5.1)
Sodium: 138 mmol/L (ref 135–145)

## 2020-09-13 LAB — URINE CULTURE: Culture: NO GROWTH

## 2020-09-13 LAB — MAGNESIUM: Magnesium: 1.5 mg/dL — ABNORMAL LOW (ref 1.7–2.4)

## 2020-09-13 LAB — GLUCOSE, CAPILLARY: Glucose-Capillary: 98 mg/dL (ref 70–99)

## 2020-09-13 LAB — PARATHYROID HORMONE, INTACT (NO CA): PTH: 8 pg/mL — ABNORMAL LOW (ref 15–65)

## 2020-09-13 LAB — CALCIUM, URINE, RANDOM: Calcium, Ur: 20.5 mg/dL

## 2020-09-13 LAB — CALCITRIOL (1,25 DI-OH VIT D): Vit D, 1,25-Dihydroxy: 66.9 pg/mL (ref 19.9–79.3)

## 2020-09-13 MED ORDER — SODIUM CHLORIDE 0.9 % IV SOLN: INTRAVENOUS | Status: AC

## 2020-09-13 NOTE — Progress Notes (Signed)
[facility] [place] NAME: Rachel Jones    MR#:  [id number]  DATE OF BIRTH:  1933-08-03  SUBJECTIVE:  daughter-in-law at bedside. Patient was sleeping broken up oriented to few questions. Did eat earlier. Waxing and waning mentation.  REVIEW OF SYSTEMS:   Review of Systems  Unable to perform ROS: Medical condition   Tolerating Diet:yes  Tolerating PT: patient is bedbound wheelchair-bound. At baseline able to transfer from bed to wheelchair  DRUG ALLERGIES:   Allergies  Allergen Reactions  . Cefuroxime Axetil Diarrhea  . Codeine Nausea Only    Intolerance instead of true allerg  . Sulfa Antibiotics Swelling and Rash    swelling    VITALS:  Blood pressure (!) 141/60, pulse 67, temperature 98.7 F (37.1 C), temperature source Oral, resp. rate 18, height 5\' 4"  (1.626 m), weight 69.1 kg, SpO2 94 %.  PHYSICAL EXAMINATION:   Physical Exam  GENERAL:  87 y.o.-year-old patient lying in the bed with no acute distress.  LUNGS: Normal breath sounds bilaterally, no wheezing, rales, rhonchi. No use of accessory muscles of respiration.  CARDIOVASCULAR: S1, S2 normal. No murmurs, rubs, or gallops.  ABDOMEN: Soft, nontender, nondistended. Bowel sounds present. EXTREMITIES: No cyanosis, clubbing or edema b/l.    NEUROLOGIC: moves all extremities well. Appears weak PSYCHIATRIC:  patient is alert and awake. Mild baseline confusion SKIN: No obvious rash, lesion, or ulcer.   LABORATORY PANEL:  CBC Recent Labs  Lab 09/13/20 0540  WBC 7.2  HGB 10.2*  HCT 31.2*  PLT 134*    Chemistries  Recent Labs  Lab 09/12/20 0820 09/13/20 0540  NA 137 138  K 4.7 4.5  CL 104 109  CO2 24 22  GLUCOSE 99 93  BUN 36* 30*  CREATININE 1.79* 1.44*  CALCIUM 13.1* 11.4*  MG 1.0* 1.5*  AST 18  --   ALT 6  --   ALKPHOS 34*  --   BILITOT 0.7  --    Cardiac Enzymes No results for input(s): TROPONINI in the last 168 hours. RADIOLOGY:  CT Head Wo  Contrast  Result Date: 09/12/2020 CLINICAL DATA:  Mental status change. EXAM: CT HEAD WITHOUT CONTRAST TECHNIQUE: Contiguous axial images were obtained from the base of the skull through the vertex without intravenous contrast. COMPARISON:  01/01/2020 FINDINGS: Exam is somewhat limited by patient motion. Brain: Stable age related cerebral atrophy, ventriculomegaly and periventricular white matter disease. No extra-axial fluid collections are identified. No CT findings for acute hemispheric infarction or intracranial hemorrhage. No mass lesions. The brainstem and cerebellum are normal. Vascular: Stable vascular calcifications. No definite aneurysm or hyperdense vessels. Skull: No fracture or bone lesions. Sinuses/Orbits: Paranasal sinuses and mastoid air cells are clear. The globes are intact. Other: No scalp lesions or scalp hematoma. IMPRESSION: 1. Stable age related cerebral atrophy, ventriculomegaly and periventricular white matter disease. 2. No acute intracranial findings or mass lesions. Electronically Signed   By: Marijo Sanes M.D.   On: 09/12/2020 09:02   DG Chest Portable 1 View  Result Date: 09/12/2020 CLINICAL DATA:  Confusion. Altered mental status. Personal history of breast carcinoma and coronary artery disease. EXAM: PORTABLE CHEST 1 VIEW COMPARISON:  01/06/2020 FINDINGS: Heart size is normal. Aortic atherosclerotic calcification noted. A moderate hiatal hernia is again noted. Both lungs are clear. IMPRESSION: No active cardiopulmonary disease. Moderate hiatal hernia. Electronically Signed   By: Marlaine Hind M.D.   On: 09/12/2020 08:41   ASSESSMENT AND PLAN:  Rachel Catalina  MEEGAN Jones is a 87 y.o. female with medical history significant of cervical uterine cancer (s/p for hysterectomy), breast cancer (s/p of partial mastectomy, lumpectomy, radiation therapy), HTN, HLD, GERD, gout, OSA not on CPAP, DVT on Eliquis, RA, carotid artery stenosis, CKD stage IV, CAD, anemia, who presents with altered mental  status and weakness  Hypercalcemia: Calcium 13.1.  Patient has a confusion.  Etiology is not clear.  Patient is on calcium and vitamin D supplement, which may have contributed partially.  Another potential differential diagnosis is cancer related hypercalcemia.  --Consulted Dr. Janese Banks of oncology-- recommends IV fluids -- serum calcium 13.1-- 11.4 -received pamidronate 90 mg IV 1 dose continue IV fluids - hold calcium and vitamin D supplement -replace magnesium -PTH and PTH related peptide, vitamin D 1-25 and urine calcium level  Addendum: Hypomagnesemia with Mg 1.0 and Phosphorus 2.7 -will give 3 g of magnesium sulfate x1  Primary cancer of upper inner quadrant of right female breast (HCC) and hx of cervical cancer: -consulted Dr. Janese Banks of oncology appreciate input -okay to hold  tamoxifen for breast cancer while in the hospital  Hyperlipidemia -pravastatin  HTN (hypertension): Blood pressure 180/137 -IV hydralazine as needed -Hold Lasix since patient need IV fluid -Continue metoprolol -Start amlodipine 5 mg daily  GERD (gastroesophageal reflux disease) -Protonix  CKD (chronic kidney disease), stage IV ([place]): Stable.  Recent baseline creatinine 1.7-2.0.  Her creatinine is 1.79, BUN 36 today -creatinine down to 1.4  Chronic deep vein thrombosis (DVT) of proximal vein of lower extremity (HCC) -Continue Eliquis  Iron deficiency anemia: Hemoglobin 10.7 (7.4 01/07/2020) -Continue iron supplement  CAD (coronary artery disease): no chest pain -pravastatin  Acute metabolic encephalopathy: Likely due to hypercalcemia.  CT head negative for acute intracranial abnormality. -Frequent neuro check -Treat hypercalcemia as above  Possible UTI: Patient was started on Macrobid yesterday in facility for possible UTI.  Today urinalysis is negative which may be due to antibiotics use. Patient has symptoms of dysuria and burning on urination -will change Macrobid to Keflex -Follow-up  urine culture    discussed with patient's daughter-in-law Rachel Jones at bedside. Patient has been having waxing and waning mentation at the facility. She is mostly bedbound wheelchair-bound. Poor appetite and recently family felt she is overall declining. Will continue to replete electrolyte's and hopefully be able to discharge the next couple days back to her long-term facility.   DVT ppx: On Eliquis Code Status: DNR per her daughter Family Communication: Yes, patient's daughter-in-law  Rachel Jones at bed side Disposition Plan:  Anticipate discharge back to previous environment--SNF Consults called: oncologist, Dr. Janese Banks Admission status and Level of care: Med-Surg:  as inpt    Level of care: Med-Surg Status is: Inpatient   TOTAL TIME TAKING CARE OF THIS PATIENT: *25* minutes.  >50% time spent on counselling and coordination of care  Note: This dictation was prepared with Dragon dictation along with smaller phrase technology. Any transcriptional errors that result from this process are unintentional.  Fritzi Mandes M.D    [facility]   CC: Primary care physician; Idelle Crouch, MDPatient ID: Rachel Jones, female   DOB: 16-Feb-1934, 87 y.o.   MRN: [id number]

## 2020-09-14 ENCOUNTER — Inpatient Hospital Stay: Payer: Medicare (Managed Care)

## 2020-09-14 LAB — BASIC METABOLIC PANEL
Anion gap: 5 (ref 5–15)
BUN: 26 mg/dL — ABNORMAL HIGH (ref 8–23)
CO2: 22 mmol/L (ref 22–32)
Calcium: 9.9 mg/dL (ref 8.9–10.3)
Chloride: 109 mmol/L (ref 98–111)
Creatinine, Ser: 1.53 mg/dL — ABNORMAL HIGH (ref 0.44–1.00)
GFR, Estimated: 33 mL/min — ABNORMAL LOW (ref >60)
Glucose, Bld: 118 mg/dL — ABNORMAL HIGH (ref 70–99)
Potassium: 3.9 mmol/L (ref 3.5–5.1)
Sodium: 136 mmol/L (ref 135–145)

## 2020-09-14 MED ORDER — ENSURE ENLIVE PO LIQD
237.0000 mL | Freq: Two times a day (BID) | ORAL | Status: DC
  Administered 2020-09-14 – 2020-09-15 (×2): 237 mL via ORAL

## 2020-09-14 MED ORDER — ADULT MULTIVITAMIN W/MINERALS CH
1.0000 | ORAL_TABLET | Freq: Every day | ORAL | Status: DC
  Administered 2020-09-14 – 2020-09-15 (×2): 1 via ORAL
  Filled 2020-09-14 (×2): qty 1

## 2020-09-14 MED ORDER — GERHARDT'S BUTT CREAM
TOPICAL_CREAM | Freq: Two times a day (BID) | CUTANEOUS | Status: DC
  Filled 2020-09-14: qty 1

## 2020-09-14 MED ORDER — SODIUM CHLORIDE 0.9 % IV SOLN: Freq: Once | INTRAVENOUS | Status: AC

## 2020-09-14 MED ORDER — FLEET ENEMA 7-19 GM/118ML RE ENEM
1.0000 | ENEMA | Freq: Once | RECTAL | Status: AC
  Administered 2020-09-14: 1 via RECTAL

## 2020-09-14 NOTE — Discharge Instructions (Signed)
Ensure (Vanilla) 2-3 times a day in between meals. Dietitian to follow at the facility

## 2020-09-14 NOTE — Progress Notes (Addendum)
Initial Nutrition Assessment  DOCUMENTATION CODES:  Not applicable  INTERVENTION:   Liberalize diet to regular for poor intake and advanced age  Add MVI with minerals daily  Ensure Enlive po BID, each supplement provides 350 kcal and 20 grams of protein  NUTRITION DIAGNOSIS:  Inadequate oral intake related to decreased appetite as evidenced by per patient/family report.  GOAL:  Patient will meet greater than or equal to 90% of their needs  MONITOR:  PO intake,Supplement acceptance  REASON FOR ASSESSMENT:  Malnutrition Screening Tool    ASSESSMENT:  Pt presented to ED from nursing facility after staff found her "shaking uncontrollably" and with AMS. Found to have elevated Calcium levels in ED. At baseline, pt is dependent on staff for most ADLs and is wheel chair bound. PMH includes CKD4, CAD, HTN, HLD, GERD, gout,  cervical uterine cancer (s/p for hysterectomy), breast cancer (s/p of partial mastectomy, lumpectomy, radiation therapy)  Pt resting in bed at the time of assessment. Son at bedside. Pt reports she ate pretty well at lunch, ~half her tray. Reports this is about her baseline at her facility. Pt reports that her meals are delivered at her facility to her room, the dining room has not re-opened after the pandemic. Son states that a lot of pt's decreased intake is related to not liking the food at her facility. Pt reports it sometimes lacks flavor or the vegetables are too soft. Endorses weight loss over the last few months. Discussed the importance of adequate nutrition in preserving lean body mass.  Pt has had ensure in the past, is agreeable to receiving during admission.   Average Meal Intake: . 4/23-4/25: 44% intake x 4 recorded meals  Nutritionally Relevant Scheduled Meds: . acidophilus  1 capsule Oral Daily  . cephALEXin  500 mg Oral Q12H  . docusate sodium  100 mg Oral BID  . ferrous gluconate  324 mg Oral Daily  . magnesium chloride  1 tablet Oral Daily  .  pantoprazole  40 mg Oral Daily  . pravastatin  40 mg Oral q1800  . tamoxifen  20 mg Oral Daily   Nutritionally Relevant PRN Meds: alum & mag hydroxide-simeth, ondansetron  Labs reviewed:  BUN 30, creatinine 1.44  Calcium 11.4  Mg 1.5  NUTRITION - FOCUSED PHYSICAL EXAM: Flowsheet Row Most Recent Value  Orbital Region Mild depletion  Upper Arm Region No depletion  Thoracic and Lumbar Region No depletion  Buccal Region No depletion  Clavicle Bone Region No depletion  Clavicle and Acromion Bone Region No depletion  Scapular Bone Region No depletion  Dorsal Hand No depletion  Patellar Region No depletion  Anterior Thigh Region No depletion  Posterior Calf Region No depletion  Edema (RD Assessment) None  Hair Reviewed  Eyes Reviewed  Mouth Reviewed  Skin Reviewed  Nails Reviewed     Diet Order:   Diet Order            Diet regular Room service appropriate? Yes; Fluid consistency: Thin  Diet effective now                EDUCATION NEEDS:  No education needs have been identified at this time  Skin:  Skin Assessment: Reviewed RN Assessment  Last BM:  4/25 per RN documentation, type 5  Height:  Ht Readings from Last 1 Encounters:  09/12/20 5\' 4"  (1.626 m)   Weight:  Wt Readings from Last 1 Encounters:  09/12/20 69.1 kg    Ideal Body Weight:  54.5 kg  BMI:  Body mass index is 26.14 kg/m.  Estimated Nutritional Needs:   Kcal:  1500-1700 kcal  Protein:  75-85 g  Fluid:  >1500 mL/d   Ranell Patrick, RD, LDN Clinical Dietitian Pager on Amion

## 2020-09-14 NOTE — Progress Notes (Signed)
[place]  [phone] [phone]  ID: Rachel Jones OB: 1933-10-26  MR#: [id number]  EYC#:[id number]  Patient Care Team: Idelle Crouch, MD as PCP - General (Internal Medicine)  CHIEF COMPLAINT: Hypercalcemia, failure to thrive.  INTERVAL HISTORY: Patient's calcium levels are now back to normal and she appears nearly back to her baseline.  She continues to have significant weakness and fatigue which her son reports has been getting worse over the past several weeks making it difficult to make transfers from bed to chair.  She denies any pain.  She offers no further complaints.  REVIEW OF SYSTEMS:   Review of Systems  Constitutional: Positive for malaise/fatigue. Negative for fever and weight loss.  Respiratory: Negative.  Negative for cough, hemoptysis and shortness of breath.   Cardiovascular: Negative.  Negative for chest pain and leg swelling.  Gastrointestinal: Negative.  Negative for abdominal pain.  Genitourinary: Negative.  Negative for dysuria.  Musculoskeletal: Negative.  Negative for back pain.  Skin: Negative.  Negative for rash.  Neurological: Positive for weakness. Negative for dizziness and focal weakness.  Psychiatric/Behavioral: Negative.  Negative for depression. The patient is not nervous/anxious.     As per HPI. Otherwise, a complete review of systems is negative.  PAST MEDICAL HISTORY: Past Medical History:  Diagnosis Date  . Anemia    is followed by renal doctor and pcp  . Arthritis   . Breast cancer ([place]) 04/2016   Rt. breast ca, f/u with radiation  . Cancer [facility]) 1961   uterine cancer  . Coronary artery disease   . Dyspnea   . GERD (gastroesophageal reflux disease)   . Hypertension   . Kidney disease    stage 4.  followed by dr. Sedonia Small  . Lower extremity edema   . Personal history of radiation therapy   . S/P lumpectomy, right breast    having done on 04/26/16  . Sleep apnea    uses cpap    PAST  SURGICAL HISTORY: Past Surgical History:  Procedure Laterality Date  . BREAST BIOPSY Right 03/2016   invasive mammary carcinoma  . BREAST LUMPECTOMY Right    04/26/16, f/u with radiation  . CAROTID STENT Left 2016   was having pain down neck. carotid artery clogged. dr. Delana Meyer put stent in  . CAROTID STENT Left   . CATARACT EXTRACTION W/ INTRAOCULAR LENS  IMPLANT, BILATERAL Bilateral 2009  . EYE SURGERY  2009   cataracts  . HIP PINNING,CANNULATED Right 01/04/2020   Procedure: CANNULATED HIP PINNING;  Surgeon: Thornton Park, MD;  Location: [facility] ORS;  Service: Orthopedics;  Laterality: Right;  . PARTIAL HYSTERECTOMY  1961   uterus only removed  . PARTIAL MASTECTOMY WITH NEEDLE LOCALIZATION Right 04/26/2016   Procedure: PARTIAL MASTECTOMY WITH NEEDLE LOCALIZATION;  Surgeon: Leonie Green, MD;  Location: [facility] ORS;  Service: General;  Laterality: Right;  . SENTINEL NODE BIOPSY Right 04/26/2016   Procedure: SENTINEL NODE BIOPSY;  Surgeon: Leonie Green, MD;  Location: [facility] ORS;  Service: General;  Laterality: Right;    FAMILY HISTORY: Family History  Problem Relation Age of Onset  . Bladder Cancer Neg Hx   . Kidney cancer Neg Hx   . Prolactinoma Neg Hx   . Prostate cancer Neg Hx     ADVANCED DIRECTIVES (Y/N):  @ADVDIR @  HEALTH MAINTENANCE: Social History   Tobacco Use  . Smoking status: Never Smoker  . Smokeless tobacco: Never Used  Substance Use Topics  . Alcohol use: No  .  Drug use: No     Colonoscopy:  PAP:  Bone density:  Lipid panel:  Allergies  Allergen Reactions  . Cefuroxime Axetil Diarrhea  . Codeine Nausea Only    Intolerance instead of true allerg  . Sulfa Antibiotics Swelling and Rash    swelling    Current Facility-Administered Medications  Medication Dose Route Frequency Provider Last Rate Last Admin  . acetaminophen (TYLENOL) tablet 1,000 mg  1,000 mg Oral Once Ivor Costa, MD      . acetaminophen (TYLENOL) tablet 650 mg  650 mg Oral  Q6H PRN Ivor Costa, MD   650 mg at 09/14/20 1426  . acidophilus (RISAQUAD) capsule 1 capsule  1 capsule Oral Daily Ivor Costa, MD   1 capsule at 09/14/20 1007  . alum & mag hydroxide-simeth (MAALOX/MYLANTA) 200-200-20 MG/5ML suspension 30 mL  30 mL Oral Q4H PRN Ivor Costa, MD      . amLODipine (NORVASC) tablet 5 mg  5 mg Oral Daily Ivor Costa, MD   5 mg at 09/14/20 1007  . apixaban (ELIQUIS) tablet 2.5 mg  2.5 mg Oral BID Ivor Costa, MD   2.5 mg at 09/14/20 1007  . docusate sodium (COLACE) capsule 100 mg  100 mg Oral BID Ivor Costa, MD   100 mg at 09/13/20 2115  . feeding supplement (ENSURE ENLIVE / ENSURE PLUS) liquid 237 mL  237 mL Oral BID BM Fritzi Mandes, MD      . ferrous gluconate (FERGON) tablet 324 mg  324 mg Oral Daily Ivor Costa, MD   324 mg at 09/14/20 1010  . Gerhardt's butt cream   Topical BID Fritzi Mandes, MD      . hydrALAZINE (APRESOLINE) injection 5 mg  5 mg Intravenous Q2H PRN Ivor Costa, MD      . magnesium chloride (SLOW-MAG) 64 MG SR tablet 64 mg  1 tablet Oral Daily Ivor Costa, MD   64 mg at 09/14/20 1010  . methocarbamol (ROBAXIN) tablet 750 mg  750 mg Oral TID Ivor Costa, MD   750 mg at 09/14/20 1426  . metoprolol tartrate (LOPRESSOR) tablet 25 mg  25 mg Oral BID Ivor Costa, MD   25 mg at 09/14/20 1007  . multivitamin with minerals tablet 1 tablet  1 tablet Oral Daily Fritzi Mandes, MD   1 tablet at 09/14/20 1426  . ondansetron (ZOFRAN) injection 4 mg  4 mg Intravenous Q8H PRN Ivor Costa, MD      . pantoprazole (PROTONIX) EC tablet 40 mg  40 mg Oral Daily Ivor Costa, MD   40 mg at 09/14/20 1007  . pravastatin (PRAVACHOL) tablet 40 mg  40 mg Oral q1800 Ivor Costa, MD   40 mg at 09/13/20 1732  . simethicone (MYLICON) chewable tablet 80 mg  80 mg Oral QID PRN Ivor Costa, MD   80 mg at 09/14/20 1010  . sodium phosphate (FLEET) 7-19 GM/118ML enema 1 enema  1 enema Rectal Once Fritzi Mandes, MD      . tamoxifen (NOLVADEX) tablet 20 mg  20 mg Oral Daily Ivor Costa, MD   20 mg at  09/14/20 1009    OBJECTIVE: Vitals:   09/14/20 1213 09/14/20 1609  BP: (!) 141/102 (!) 125/102  Pulse: 61 73  Resp: 16 18  Temp: 98.2 F (36.8 C) 98.7 F (37.1 C)  SpO2: 100% 99%     Body mass index is 26.14 kg/m.    ECOG FS:4 - Bedbound  General: Ill-appearing, no acute distress. Eyes: Pink conjunctiva,  anicteric sclera. HEENT: Normocephalic, moist mucous membranes. Lungs: No audible wheezing or coughing. Heart: Regular rate and rhythm. Abdomen: Soft, nontender, no obvious distention. Musculoskeletal: No edema, cyanosis, or clubbing. Neuro: Alert, answering all questions appropriately. Cranial nerves grossly intact. Skin: No rashes or petechiae noted. Psych: Normal affect.  LAB RESULTS:  Lab Results  Component Value Date   NA 136 09/14/2020   K 3.9 09/14/2020   CL 109 09/14/2020   CO2 22 09/14/2020   GLUCOSE 118 (H) 09/14/2020   BUN 26 (H) 09/14/2020   CREATININE 1.53 (H) 09/14/2020   CALCIUM 9.9 09/14/2020   PROT 6.1 (L) 09/12/2020   ALBUMIN 3.2 (L) 09/12/2020   AST 18 09/12/2020   ALT 6 09/12/2020   ALKPHOS 34 (L) 09/12/2020   BILITOT 0.7 09/12/2020   GFRNONAA 33 (L) 09/14/2020   GFRAA 29 (L) 01/07/2020    Lab Results  Component Value Date   WBC 7.2 09/13/2020   NEUTROABS 4.3 09/12/2020   HGB 10.2 (L) 09/13/2020   HCT 31.2 (L) 09/13/2020   MCV 95.7 09/13/2020   PLT 134 (L) 09/13/2020     STUDIES: CT ABDOMEN PELVIS WO CONTRAST  Result Date: 09/14/2020 CLINICAL DATA:  Abdominal and pelvic pain. Constipation. Personal history of breast cancer. EXAM: CT ABDOMEN AND PELVIS WITHOUT CONTRAST TECHNIQUE: Multidetector CT imaging of the abdomen and pelvis was performed following the standard protocol without IV contrast. COMPARISON:  CT scan 12/27/2013 FINDINGS: Lower chest: Progressive fine reticular and mild reticulonodular subpleural interstitial accentuation of both lung bases compared to the 2015 exam. Large hiatal hernia. Mitral valve calcification. Right  coronary artery atherosclerotic calcification. Hepatobiliary: Mildly contracted gallbladder. Otherwise unremarkable. Pancreas: Unremarkable Spleen: Unremarkable Adrenals/Urinary Tract: Suspected bilateral renal peripelvic cysts similar to prior. Left kidney upper pole 1.1 cm exophytic lesion with internal density of 19 Hounsfield units, previously 0.9 cm in diameter, probably a cyst but technically nonspecific. No hydronephrosis. Urinary bladder appears unremarkable. Stomach/Bowel: Large hiatal hernia. Prominence of stool in the rectal vault with low position of the anorectal junction compatible with pelvic floor laxity. Increased presacral edema without bony abnormality observed, this could be a manifestation of low-grade stercoral colitis. In general the amount of stool in the remainder of the colon is within normal limits. Vascular/Lymphatic: Aortoiliac atherosclerotic vascular disease. Reproductive: Uterus absent. Increased prominence of the contour of the left ovary, currently 3.7 by 2.8 by 3.5 cm (volume = 19 cm^3), previously about 1.9 by 1.5 by 2.4 cm (volume = 3.6 cm^3). My sense is that there is likely a septated cystic lesion in the left ovary although densities only 8 Hounsfield units which is only mildly above that of urine in the urinary bladder. Other: No supplemental non-categorized findings. Musculoskeletal: Fatty prominence of the right tensor fascia lata muscle with some fatty atrophy in the right gluteus minimus and medius muscles. Cannulated screw fixation of the right hip. Severe osteoarthritis of the right hip. Lower thoracic and lumbar spondylosis and degenerative disc disease with multilevel impingement. IMPRESSION: 1. Prominence of stool in the rectal vault with some surrounding perirectal and presacral stranding which could represent low-grade stercoral colitis. There is also low position of the anorectal junction indicating pelvic floor laxity. 2. Increased prominence of the left ovary  potentially with a complex internal cystic lesion. A neoplasm is not excluded. The left ovary currently measures 19 cubic cm and was previously 3.6 cubic cm. Dedicated pelvic sonography recommended for further characterization. 3. Fine reticulonodular subpleural interstitial accentuation at the lung bases, possibly early fibrosis,  increased from 2015. 4. Other imaging findings of potential clinical significance: Large hiatal hernia. Mitral valve calcification. Aortic Atherosclerosis (ICD10-I70.0). Right coronary artery atherosclerosis. Bilateral renal cysts. Severe right hip osteoarthritis. Multilevel lumbar impingement. Electronically Signed   By: Van Clines M.D.   On: 09/14/2020 11:31   CT Head Wo Contrast  Result Date: 09/12/2020 CLINICAL DATA:  Mental status change. EXAM: CT HEAD WITHOUT CONTRAST TECHNIQUE: Contiguous axial images were obtained from the base of the skull through the vertex without intravenous contrast. COMPARISON:  01/01/2020 FINDINGS: Exam is somewhat limited by patient motion. Brain: Stable age related cerebral atrophy, ventriculomegaly and periventricular white matter disease. No extra-axial fluid collections are identified. No CT findings for acute hemispheric infarction or intracranial hemorrhage. No mass lesions. The brainstem and cerebellum are normal. Vascular: Stable vascular calcifications. No definite aneurysm or hyperdense vessels. Skull: No fracture or bone lesions. Sinuses/Orbits: Paranasal sinuses and mastoid air cells are clear. The globes are intact. Other: No scalp lesions or scalp hematoma. IMPRESSION: 1. Stable age related cerebral atrophy, ventriculomegaly and periventricular white matter disease. 2. No acute intracranial findings or mass lesions. Electronically Signed   By: Marijo Sanes M.D.   On: 09/12/2020 09:02   DG Chest Portable 1 View  Result Date: 09/12/2020 CLINICAL DATA:  Confusion. Altered mental status. Personal history of breast carcinoma and  coronary artery disease. EXAM: PORTABLE CHEST 1 VIEW COMPARISON:  01/06/2020 FINDINGS: Heart size is normal. Aortic atherosclerotic calcification noted. A moderate hiatal hernia is again noted. Both lungs are clear. IMPRESSION: No active cardiopulmonary disease. Moderate hiatal hernia. Electronically Signed   By: Marlaine Hind M.D.   On: 09/12/2020 08:41    ASSESSMENT: Hypercalcemia, failure to thrive.  PLAN:    1.  Hypercalcemia: Now resolved with current calcium 9.9.  Patient received IV fluids and pamidronate.  Calcium and vitamin D supplements have been discontinued.  CT of the abdomen pelvis revealed a complex cystic lesion near the left ovary.  Although neoplasm cannot be excluded this is low suspicion.  Patient does not have any recent chest imaging.  Given patient's performance status, even if malignancy was determined, no treatment would be possible.  Recommend monitoring calcium levels as an outpatient.  Patient does not have follow-up in the cancer center at this time. 2.  Chronic renal insufficiency: Improved.  Patient's creatinine is 1.53. 3.  Anemia: Chronic and unchanged. 4.  History of breast cancer: Continue tamoxifen completing treatment in March 2023.  Patient's most recent mammogram was in January 2021.  Given her declining performance status unclear the utility of pursuing additional imaging.  Will follow.    Lloyd Huger, MD   09/14/2020 4:38 PM

## 2020-09-14 NOTE — Progress Notes (Signed)
Rachel Jones at [place] NAME: Rachel Jones    MR#:  [id number]  DATE OF BIRTH:  24-Feb-1934  SUBJECTIVE:  son at bedside. Patient more awake and converses. Did eat chicken sandwich chips and been drinking fluids. Weak   REVIEW OF SYSTEMS:   Review of Systems  Unable to perform ROS: Medical condition   Tolerating Diet:yes  Tolerating PT: patient is bedbound wheelchair-bound. At baseline able to transfer from bed to wheelchair Worked a little with PT  DRUG ALLERGIES:   Allergies  Allergen Reactions  . Cefuroxime Axetil Diarrhea  . Codeine Nausea Only    Intolerance instead of true allerg  . Sulfa Antibiotics Swelling and Rash    swelling    VITALS:  Blood pressure (!) 141/102, pulse 61, temperature 98.2 F (36.8 C), resp. rate 16, height 5\' 4"  (1.626 m), weight 69.1 kg, SpO2 100 %.  PHYSICAL EXAMINATION:   Physical Exam  GENERAL:  87 y.o.-year-old patient lying in the bed with no acute distress.  LUNGS: Normal breath sounds bilaterally, no wheezing, rales, rhonchi. No use of accessory muscles of respiration.  CARDIOVASCULAR: S1, S2 normal. No murmurs, rubs, or gallops.  ABDOMEN: Soft, nontender, nondistended. Bowel sounds present. EXTREMITIES: No cyanosis, clubbing or edema b/l.    NEUROLOGIC: moves all extremities well. Appears weak PSYCHIATRIC:  patient is alert and awake. Mild baseline confusion SKIN: No obvious rash, lesion, or ulcer.   LABORATORY PANEL:  CBC Recent Labs  Lab 09/13/20 0540  WBC 7.2  HGB 10.2*  HCT 31.2*  PLT 134*    Chemistries  Recent Labs  Lab 09/12/20 0820 09/13/20 0540 09/14/20 0919  NA 137 138 136  K 4.7 4.5 3.9  CL 104 109 109  CO2 24 22 22   GLUCOSE 99 93 118*  BUN 36* 30* 26*  CREATININE 1.79* 1.44* 1.53*  CALCIUM 13.1* 11.4* 9.9  MG 1.0* 1.5*  --   AST 18  --   --   ALT 6  --   --   ALKPHOS 34*  --   --   BILITOT 0.7  --   --    Cardiac Enzymes No results for input(s):  TROPONINI in the last 168 hours. RADIOLOGY:  CT ABDOMEN PELVIS WO CONTRAST  Result Date: 09/14/2020 CLINICAL DATA:  Abdominal and pelvic pain. Constipation. Personal history of breast cancer. EXAM: CT ABDOMEN AND PELVIS WITHOUT CONTRAST TECHNIQUE: Multidetector CT imaging of the abdomen and pelvis was performed following the standard protocol without IV contrast. COMPARISON:  CT scan 12/27/2013 FINDINGS: Lower chest: Progressive fine reticular and mild reticulonodular subpleural interstitial accentuation of both lung bases compared to the 2015 exam. Large hiatal hernia. Mitral valve calcification. Right coronary artery atherosclerotic calcification. Hepatobiliary: Mildly contracted gallbladder. Otherwise unremarkable. Pancreas: Unremarkable Spleen: Unremarkable Adrenals/Urinary Tract: Suspected bilateral renal peripelvic cysts similar to prior. Left kidney upper pole 1.1 cm exophytic lesion with internal density of 19 Hounsfield units, previously 0.9 cm in diameter, probably a cyst but technically nonspecific. No hydronephrosis. Urinary bladder appears unremarkable. Stomach/Bowel: Large hiatal hernia. Prominence of stool in the rectal vault with low position of the anorectal junction compatible with pelvic floor laxity. Increased presacral edema without bony abnormality observed, this could be a manifestation of low-grade stercoral colitis. In general the amount of stool in the remainder of the colon is within normal limits. Vascular/Lymphatic: Aortoiliac atherosclerotic vascular disease. Reproductive: Uterus absent. Increased prominence of the contour of the left ovary, currently 3.7 by 2.8 by  3.5 cm (volume = 19 cm^3), previously about 1.9 by 1.5 by 2.4 cm (volume = 3.6 cm^3). My sense is that there is likely a septated cystic lesion in the left ovary although densities only 8 Hounsfield units which is only mildly above that of urine in the urinary bladder. Other: No supplemental non-categorized findings.  Musculoskeletal: Fatty prominence of the right tensor fascia lata muscle with some fatty atrophy in the right gluteus minimus and medius muscles. Cannulated screw fixation of the right hip. Severe osteoarthritis of the right hip. Lower thoracic and lumbar spondylosis and degenerative disc disease with multilevel impingement. IMPRESSION: 1. Prominence of stool in the rectal vault with some surrounding perirectal and presacral stranding which could represent low-grade stercoral colitis. There is also low position of the anorectal junction indicating pelvic floor laxity. 2. Increased prominence of the left ovary potentially with a complex internal cystic lesion. A neoplasm is not excluded. The left ovary currently measures 19 cubic cm and was previously 3.6 cubic cm. Dedicated pelvic sonography recommended for further characterization. 3. Fine reticulonodular subpleural interstitial accentuation at the lung bases, possibly early fibrosis, increased from 2015. 4. Other imaging findings of potential clinical significance: Large hiatal hernia. Mitral valve calcification. Aortic Atherosclerosis (ICD10-I70.0). Right coronary artery atherosclerosis. Bilateral renal cysts. Severe right hip osteoarthritis. Multilevel lumbar impingement. Electronically Signed   By: Van Clines M.D.   On: 09/14/2020 11:31   ASSESSMENT AND PLAN:  [name] is a 87 y.o. female with medical history significant of cervical uterine cancer (s/p for hysterectomy), breast cancer (s/p of partial mastectomy, lumpectomy, radiation therapy), HTN, HLD, GERD, gout, OSA not on CPAP, DVT on Eliquis, RA, carotid artery stenosis, CKD stage IV, CAD, anemia, who presents with altered mental status and weakness  Hypercalcemia: Calcium 13.1.  Patient has a confusion.  Etiology is not clear.  Patient is on calcium and vitamin D supplement, which may have contributed partially.  Another potential differential diagnosis is cancer related hypercalcemia.   --Consulted Dr. Janese Banks of oncology-- recommends IV fluids -- serum calcium 13.1-- 11.4--9.4 -received pamidronate 90 mg IV 1 dose --received IV fluids - hold calcium and vitamin D supplement -replace magnesium  Primary cancer of upper inner quadrant of right female breast ([place]) and hx of cervical cancer: -consulted Dr. Janese Banks of oncology appreciate input -okay to hold  tamoxifen for breast cancer while in the hospital --CT abd/pelvis done per family request--d/w dr Greig Right further recommendations 1. Prominence of stool in the rectal vault with some surrounding perirectal and presacral stranding which could represent low-grade stercoral colitis. There is also low position of the anorectal junction indicating pelvic floor laxity. 2. Increased prominence of the left ovary potentially with a complex internal cystic lesion. A neoplasm is not excluded. The left ovary currently measures 19 cubic cm and was previously 3.6 cubic cm. Dedicated pelvic sonography recommended for further characterization. 3. Fine reticulonodular subpleural interstitial accentuation at the lung bases, possibly early fibrosis, increased from 2015. 4. Other imaging findings of potential clinical significance: Large hiatal hernia. Mitral valve calcification. Aortic Atherosclerosis (ICD10-I70.0). Right coronary artery atherosclerosis. Bilateral renal cysts. Severe right hip osteoarthritis. Multilevel lumbar Impingement.  Hyperlipidemia -pravastatin  HTN (hypertension): Blood pressure 180/137 -IV hydralazine as needed -Continue metoprolol -Start amlodipine 5 mg daily  GERD (gastroesophageal reflux disease) -Protonix  CKD (chronic kidney disease), stage IV ([place]): Stable.  Recent baseline creatinine 1.7-2.0.  Her creatinine is 1.79, BUN 36 today -creatinine down to 1.4 --hold lasix  Chronic deep vein thrombosis (DVT)  of proximal vein of lower extremity (HCC) -Continue Eliquis  Iron deficiency  anemia: Hemoglobin 10.7 (7.4 01/07/2020) -Continue iron supplement  CAD (coronary artery disease): no chest pain -pravastatin  Acute metabolic encephalopathy: Likely due to hypercalcemia.  CT head negative for acute intracranial abnormality. --Treat hypercalcemia as above --mentation improved  Possible UTI: Patient was started on Macrobid yesterday in facility for possible UTI.  Today urinalysis is negative which may be due to antibiotics use. -Follow-up urine culture--no growth. D/c abxs -denies any symptoms today   discussed with patient's son at bedside. Overall slowly improving. Long term poor prognosis. Son and family understand. Will discharge back to her long-term [facility] tomorrow.  DVT ppx: On Eliquis Code Status: DNR per her daughter Family Communication: Yes, patient's son at bed side Disposition Plan:  Anticipate discharge back to previous environment--SNF Consults called: oncologist, [place] Admission status and Level of care: Med-Surg:  as inpt    Level of care: Med-Surg Status is: Inpatient   TOTAL TIME TAKING CARE OF THIS PATIENT: *25* minutes.  >50% time spent on counselling and coordination of care  Note: This dictation was prepared with Dragon dictation along with smaller phrase technology. Any transcriptional errors that result from this process are unintentional.  Fritzi Mandes M.D    [facility]   CC: Primary care physician; Idelle Crouch, MDPatient ID: Eudelia Bunch, female   DOB: [birth year], 87 y.o.   MRN: [id number]

## 2020-09-14 NOTE — NC FL2 (Signed)
[place] LEVEL OF CARE SCREENING TOOL     IDENTIFICATION  Patient Name: Rachel Jones Birthdate: 1933/06/02 Sex: female Admission Date (Current Location): 09/12/2020  [place] and [place] Number:  Engineering geologist and Address:  [facility], [address], [place], [place] [address]      Provider Number: [id number]  Attending Physician Name and Address:  Fritzi Mandes, MD  Relative Name and Phone Number:  Butch Penny Daughgter [phone]    Current Level of Care: Hospital Recommended Level of Care: [place] Prior Approval Number:    Date Approved/Denied:   PASRR Number:    Discharge Plan: SNF    Current Diagnoses: Patient Active Problem List   Diagnosis Date Noted  . Hypercalcemia 09/12/2020  . Iron deficiency anemia 09/12/2020  . Acute metabolic encephalopathy 17/79/3903  . CAD (coronary artery disease) 09/12/2020  . Hypomagnesemia 09/12/2020  . Closed right hip fracture ([place]) 01/02/2020  . Chronic anticoagulation   . Chronic deep vein thrombosis (DVT) of proximal vein of lower extremity ([place]) 02/06/2019  . B12 deficiency 10/23/2017  . Hydronephrosis 11/18/2016  . Gout 11/18/2016  . Cervical cancer ([place]) 11/18/2016  . Carotid stenosis 04/04/2016  . Primary cancer of upper inner quadrant of right female breast ([place]) 04/04/2016  . Lymphedema 04/04/2016  . Chronic venous insufficiency 04/04/2016  . Chest tightness 02/15/2016  . Obstructive sleep apnea syndrome 08/13/2015  . SOB (shortness of breath) 12/18/2014  . Chronic fatigue 07/09/2014  . CKD (chronic kidney disease), stage IV ([place]) 05/09/2014  . Atypical chest pain 05/09/2014  . Chronic tension-type headache, intractable 03/06/2014  . Headache 02/06/2014  . Facial numbness 02/06/2014  . Rheumatoid arthritis involving multiple sites with positive rheumatoid factor ([place]) 11/01/2013  . Peripheral edema 11/01/2013  . Post herpetic neuralgia 11/01/2013  .  Hyperlipidemia 11/01/2013  . HTN (hypertension) 11/01/2013  . History of recurrent UTIs 11/01/2013  . H/O: hysterectomy 11/01/2013  . GERD (gastroesophageal reflux disease) 11/01/2013  . Chronic headache 11/01/2013  . Anemia 11/01/2013    Orientation RESPIRATION BLADDER Height & Weight     Self,Situation    Incontinent Weight: 69.1 kg Height:  5\' 4"  (162.6 cm)  BEHAVIORAL SYMPTOMS/MOOD NEUROLOGICAL BOWEL NUTRITION STATUS      Incontinent Diet (regular)  AMBULATORY STATUS COMMUNICATION OF NEEDS Skin   Extensive Assist Verbally Surgical wounds                       Personal Care Assistance Level of Assistance  Bathing,Dressing Bathing Assistance: Limited assistance   Dressing Assistance: Maximum assistance     Functional Limitations Info  Hearing   Hearing Info: Impaired      SPECIAL CARE FACTORS FREQUENCY                       Contractures Contractures Info: Not present    Additional Factors Info  Code Status,Allergies Code Status Info: DNR Allergies Info: Cefuroxime Axetil, Codeine, Sulfa Antibiotics           Current Medications (09/14/2020):  This is the current hospital active medication list Current Facility-Administered Medications  Medication Dose Route Frequency Provider Last Rate Last Admin  . 0.9 %  sodium chloride infusion   Intravenous Once Fritzi Mandes, MD      . acetaminophen (TYLENOL) tablet 1,000 mg  1,000 mg Oral Once Ivor Costa, MD      . acetaminophen (TYLENOL) tablet 650 mg  650 mg Oral Q6H  PRN Ivor Costa, MD   650 mg at 09/13/20 9528  . acidophilus (RISAQUAD) capsule 1 capsule  1 capsule Oral Daily Ivor Costa, MD   1 capsule at 09/14/20 1007  . alum & mag hydroxide-simeth (MAALOX/MYLANTA) 200-200-20 MG/5ML suspension 30 mL  30 mL Oral Q4H PRN Ivor Costa, MD      . amLODipine (NORVASC) tablet 5 mg  5 mg Oral Daily Ivor Costa, MD   5 mg at 09/14/20 1007  . apixaban (ELIQUIS) tablet 2.5 mg  2.5 mg Oral BID Ivor Costa, MD   2.5 mg at  09/14/20 1007  . cephALEXin (KEFLEX) capsule 500 mg  500 mg Oral Q12H Ivor Costa, MD   500 mg at 09/14/20 1008  . docusate sodium (COLACE) capsule 100 mg  100 mg Oral BID Ivor Costa, MD   100 mg at 09/13/20 2115  . feeding supplement (ENSURE ENLIVE / ENSURE PLUS) liquid 237 mL  237 mL Oral BID BM Fritzi Mandes, MD      . ferrous gluconate (FERGON) tablet 324 mg  324 mg Oral Daily Ivor Costa, MD   324 mg at 09/14/20 1010  . hydrALAZINE (APRESOLINE) injection 5 mg  5 mg Intravenous Q2H PRN Ivor Costa, MD      . magnesium chloride (SLOW-MAG) 64 MG SR tablet 64 mg  1 tablet Oral Daily Ivor Costa, MD   64 mg at 09/14/20 1010  . methocarbamol (ROBAXIN) tablet 750 mg  750 mg Oral TID Ivor Costa, MD   750 mg at 09/14/20 1007  . metoprolol tartrate (LOPRESSOR) tablet 25 mg  25 mg Oral BID Ivor Costa, MD   25 mg at 09/14/20 1007  . multivitamin with minerals tablet 1 tablet  1 tablet Oral Daily Fritzi Mandes, MD      . ondansetron [facility]) injection 4 mg  4 mg Intravenous Q8H PRN Ivor Costa, MD      . pantoprazole (PROTONIX) EC tablet 40 mg  40 mg Oral Daily Ivor Costa, MD   40 mg at 09/14/20 1007  . pravastatin (PRAVACHOL) tablet 40 mg  40 mg Oral q1800 Ivor Costa, MD   40 mg at 09/13/20 1732  . simethicone (MYLICON) chewable tablet 80 mg  80 mg Oral QID PRN Ivor Costa, MD   80 mg at 09/14/20 1010  . tamoxifen (NOLVADEX) tablet 20 mg  20 mg Oral Daily Ivor Costa, MD   20 mg at 09/14/20 1009     Discharge Medications: Please see discharge summary for a list of discharge medications.  Relevant Imaging Results:  Relevant Lab Results:   Additional Information SS# [id number]  Su Hilt, RN

## 2020-09-14 NOTE — TOC Progression Note (Signed)
Transition of Care [facility]) - Progression Note    Patient Details  Name: Rachel Jones MRN: [id number] Date of Birth: 08/21/1933  Transition of Care [facility]) CM/SW Contact  Su Hilt, RN Phone Number: 09/14/2020, 2:20 PM  Clinical Narrative:    Completed the Fl2 for the patient to go back to [facility] where she resides as a long term resident        Expected Discharge Plan and Services                                                 Social Determinants of Health (SDOH) Interventions    Readmission Risk Interventions Readmission Risk Prevention Plan 01/03/2020  Transportation Screening Complete  PCP or Specialist Appt within 3-5 Days Complete  HRI or [place] Complete  Social Work Consult for [place] Planning/Counseling Complete  Palliative Care Screening Not Applicable  Medication Review Press photographer) Complete  Some recent data might be hidden

## 2020-09-14 NOTE — Evaluation (Signed)
Physical Therapy Evaluation Patient Details Name: Rachel Jones MRN: [id number] DOB: 06/02/1933 Today's Date: 09/14/2020   History of Present Illness  Pt is an 87 y.o. female with medical history significant of cervical uterine cancer, breast cancer, lumpectomy, HTN, HLD, GERD, gout, OSA not on CPAP, DVT, RA, carotid artery stenosis, CKD stage IV, CAD, anemia, who presents with altered mental status and weakness.  MD assessment includes: Hypercalcemia, hypomagnesemia, HLD, HTN, acute metabolic encephalopathy, possible UTI, and CKD.    Clinical Impression  Pt was pleasant and motivated to participate during the session. Pt put forth good effort during the session but ultimately required significant physical assistance with bed mobility tasks and despite max A provided was unable to come to standing at the EOB.  Per patient and confirmed with staff at [facility] the pt is typically able to perform SPT's from bed to/from wheelchair with minimal assistance.  Pt presents with significant deficits in functional strength compared to her baseline and will benefit from PT services in a SNF setting upon discharge to safely address deficits listed in patient problem list for decreased caregiver assistance and eventual return to PLOF.      Follow Up Recommendations SNF;Supervision/Assistance - 24 hour    Equipment Recommendations  None recommended by PT    Recommendations for Other Services       Precautions / Restrictions Precautions Precautions: Fall Restrictions Weight Bearing Restrictions: No      Mobility  Bed Mobility Overal bed mobility: Needs Assistance Bed Mobility: Sit to Supine;Supine to Sit     Supine to sit: Mod assist Sit to supine: Mod assist   General bed mobility comments: Mod A for BLE and trunk control    Transfers                 General transfer comment: Max asssit provided but pt remained unable to clear the surface of the bed during transfer  attempts  Ambulation/Gait                Stairs            Wheelchair Mobility    Modified Rankin (Stroke Patients Only)       Balance Overall balance assessment: Needs assistance Sitting-balance support: Bilateral upper extremity supported;Feet unsupported Sitting balance-Leahy Scale: Fair                                       Pertinent Vitals/Pain Pain Assessment: No/denies pain    Home Living Family/patient expects to be discharged to:: Skilled nursing facility                      Prior Function Level of Independence: Needs assistance   Gait / Transfers Assistance Needed: Min A with transfers, non-ambulatory  ADL's / Homemaking Assistance Needed: Min A with ADLs and set-up        Hand Dominance   Dominant Hand: Right    Extremity/Trunk Assessment   Upper Extremity Assessment Upper Extremity Assessment: Generalized weakness    Lower Extremity Assessment Lower Extremity Assessment: Generalized weakness       Communication   Communication: No difficulties  Cognition Arousal/Alertness: Awake/alert Behavior During Therapy: WFL for tasks assessed/performed Overall Cognitive Status: Impaired/Different from baseline Area of Impairment: Orientation                 Orientation Level: Person  General Comments: Pt reported that she was [place] the police station to "get my hip fixed", knew the year but not the month; pt pleasant and able to follow commands well      General Comments      Exercises Total Joint Exercises Ankle Circles/Pumps: AROM;Strengthening;Both;10 reps Heel Slides: AAROM;Both;5 reps;Strengthening Hip ABduction/ADduction: AAROM;Strengthening;5 reps;Both Straight Leg Raises: AAROM;Strengthening;Both;5 reps Long Arc Quad: AROM;Strengthening;Both;10 reps Knee Flexion: 10 reps;Both;Strengthening;AROM Other Exercises Other Exercises: Bed mobility and core therex with multiple rolling  L/R   Assessment/Plan    PT Assessment Patient needs continued PT services  PT Problem List Decreased strength;Decreased activity tolerance;Decreased balance;Decreased mobility;Decreased knowledge of use of DME       PT Treatment Interventions DME instruction;Functional mobility training;Therapeutic activities;Therapeutic exercise;Balance training;Patient/family education    PT Goals (Current goals can be found in the Care Plan section)  Acute Rehab PT Goals Patient Stated Goal: To get stronger PT Goal Formulation: With patient Time For Goal Achievement: 09/27/20 Potential to Achieve Goals: Fair    Frequency Min 2X/week   Barriers to discharge        Co-evaluation               AM-PAC PT "6 Clicks" Mobility  Outcome Measure Help needed turning from your back to your side while in a flat bed without using bedrails?: A Lot Help needed moving from lying on your back to sitting on the side of a flat bed without using bedrails?: A Lot Help needed moving to and from a bed to a chair (including a wheelchair)?: Total Help needed standing up from a chair using your arms (e.g., wheelchair or bedside chair)?: Total Help needed to walk in hospital room?: Total Help needed climbing 3-5 steps with a railing? : Total 6 Click Score: 8    End of Session Equipment Utilized During Treatment: Gait belt Activity Tolerance: Patient tolerated treatment well Patient left: in bed;with call bell/phone within reach;with nursing/sitter in room;with bed alarm set Nurse Communication: Mobility status PT Visit Diagnosis: Muscle weakness (generalized) (M62.81)    Time: 3875-6433 PT Time Calculation (min) (ACUTE ONLY): 45 min   Charges:   PT Evaluation $PT Eval Moderate Complexity: 1 Mod PT Treatments $Therapeutic Exercise: 8-22 mins $Therapeutic Activity: 8-22 mins        D. Scott Verle Brillhart PT, DPT 09/14/20, 1:56 PM

## 2020-09-15 LAB — MAGNESIUM: Magnesium: 1.1 mg/dL — ABNORMAL LOW (ref 1.7–2.4)

## 2020-09-15 MED ORDER — ENSURE ENLIVE PO LIQD: 237.0000 mL | Freq: Two times a day (BID) | ORAL | 12 refills | Status: DC

## 2020-09-15 MED ORDER — GERHARDT'S BUTT CREAM: 1.0000 "application " | TOPICAL_CREAM | Freq: Two times a day (BID) | CUTANEOUS | 0 refills | Status: DC

## 2020-09-15 MED ORDER — ADULT MULTIVITAMIN W/MINERALS CH: 1.0000 | ORAL_TABLET | Freq: Every day | ORAL | 0 refills | Status: DC

## 2020-09-15 NOTE — Discharge Summary (Signed)
[facility] [place] NAME: Rachel Jones    MR#:  [id number]  DATE OF BIRTH:  01/22/1934  DATE OF ADMISSION:  09/12/2020 ADMITTING PHYSICIAN: Ivor Costa, MD  DATE OF DISCHARGE: 09/15/2020  PRIMARY CARE PHYSICIAN: Idelle Crouch, MD    ADMISSION DIAGNOSIS:  Hypercalcemia [E83.52] Myalgia [M79.10] Confusion [R41.0]  DISCHARGE DIAGNOSIS:  Hypercalcemia--improved  SECONDARY DIAGNOSIS:   Past Medical History:  Diagnosis Date  . Anemia    is followed by renal doctor and pcp  . Arthritis   . Breast cancer ([place]) 04/2016   Rt. breast ca, f/u with radiation  . Cancer [facility]) 1961   uterine cancer  . Coronary artery disease   . Dyspnea   . GERD (gastroesophageal reflux disease)   . Hypertension   . Kidney disease    stage 4.  followed by dr. Sedonia Small  . Lower extremity edema   . Personal history of radiation therapy   . S/P lumpectomy, right breast    having done on 04/26/16  . Sleep apnea    uses cpap    HOSPITAL COURSE:  Rachel Jones a 87 y.o.femalewith medical history significant ofcervical uterine cancer (s/p for hysterectomy), breast cancer (s/p of partial mastectomy, lumpectomy, radiation therapy),HTN, HLD, GERD, gout, OSA not on CPAP, DVT on Eliquis, RA,carotid artery stenosis, CKD stage IV, CAD, anemia, who presents with altered mental status and weakness  Hypercalcemia:Calcium 13.1. Patient has a confusion. Etiology is not clear. Patient is on calcium and vitamin D supplement, which may have contributed partially. Another potential differential diagnosis is cancer related hypercalcemia.  --Consulted Dr. Janese Banks of oncology-- recommends IV fluids -- serum calcium 13.1-- 11.4--9.4 -received pamidronate 90 mg IV 1 dose --received IV fluids - holdcalcium supplement for now. Pt on MVI  Primary cancer of upper inner quadrant of right female breast (HCC)and hx of cervical cancer: -consulted Dr. Janese Banks of oncology  appreciate input -resumed  tamoxifen for breast cancer at d/c--CT abd/pelvis done per family request--d/w dr Greig Right further recommendations 1. Prominence of stool in the rectal vault with some surrounding perirectal and presacral stranding which could represent low-grade stercoral colitis. There is also low position of the anorectal junction indicating pelvic floor laxity. 2. Increased prominence of the left ovary potentially with a complex internal cystic lesion. A neoplasm is not excluded. The left ovary currently measures 19 cubic cm and was previously 3.6 cubic cm. Dedicated pelvic sonography recommended for further characterization. 3. Fine reticulonodular subpleural interstitial accentuation at the lung bases, possibly early fibrosis, increased from 2015. 4. Other imaging findings of potential clinical significance: Large hiatal hernia. Mitral valve calcification. Aortic Atherosclerosis (ICD10-I70.0). Right coronary artery atherosclerosis. Bilateral renal cysts. Severe right hip osteoarthritis. Multilevel lumbar Impingement. --family informed of CT abd results   Hyperlipidemia -pravastatin  HTN (hypertension): -IV hydralazine as needed -Continue metoprolol  GERD (gastroesophageal reflux disease) -Protonix  CKD (chronic kidney disease), stage IV (HCC):Stable. Recent baseline creatinine 1.7-2.0. Her creatinine is 1.79, BUN 36 today -creatinine down to 1.4 --d/ced lasix  Chronic deep vein thrombosis (DVT) of proximal vein of lower extremity (HCC) -Continue Eliquis  Iron deficiency anemia: Hemoglobin 10.7 (7.4 01/07/2020) -Continue iron supplement, po b12 and MVI  CAD (coronary artery disease): no chest pain -pravastatin  Acute metabolic encephalopathy:Likely due to hypercalcemia. CT head negative for acute intracranial abnormality. --Treat hypercalcemia as above --mentation improved  Possible WER:[id number] was started on Macrobid yesterday in  facility for possible UTI. Today urinalysis is negative which may  be due to antibiotics use. -Follow-up urine culture--no growth. D/c abxs -denies any symptoms today  discussed with patient's son Duane at bedside. Overall slowly improving.   Will [facility] back [facility] today  PT to [facility] with pt at [facility]  DVT ppx:On Eliquis Code Status:DNR per her daughter Family Communication: Yes, patient'sson Duane at bed side Disposition Plan: Anticipate discharge back to previous environment--SNF Consults called:oncologist, [place] Admission status and Level of care:Med-Surg: as inpt  Level of care: Med-Surg Status is: Inpatient CONSULTS OBTAINED:  Treatment Team:  Sindy Guadeloupe, MD  DRUG ALLERGIES:   Allergies  Allergen Reactions  . Cefuroxime Axetil Diarrhea  . Codeine Nausea Only    Intolerance instead of true allerg  . Sulfa Antibiotics Swelling and Rash    swelling    DISCHARGE MEDICATIONS:   Allergies as of 09/15/2020      Reactions   Cefuroxime Axetil Diarrhea   Codeine Nausea Only   Intolerance instead of true allerg   Sulfa Antibiotics Swelling, Rash   swelling      Medication List    STOP taking these medications   calcium-vitamin D 500-200 MG-UNIT tablet Commonly known as: OSCAL WITH D   furosemide 20 MG tablet Commonly known as: LASIX   nitrofurantoin (macrocrystal-monohydrate) 100 MG capsule Commonly known as: MACROBID     TAKE these medications   acetaminophen 500 MG tablet Commonly known as: TYLENOL Take 1,000 mg by mouth daily. May take an additional 1000 mgs as needed for pain   alum & mag hydroxide-simeth 200-200-20 MG/5ML suspension Commonly known as: MAALOX/MYLANTA Take 30 mLs by mouth every 4 (four) hours as needed for indigestion. Notes to patient: Not given this hospitalization   apixaban 2.5 MG Tabs tablet Commonly known as: ELIQUIS Take 1 tablet by mouth 2 (two) times a day.   docusate sodium 100  MG capsule Commonly known as: COLACE Take 100 mg by mouth 2 (two) times daily.   esomeprazole 40 MG capsule Commonly known as: NEXIUM Take 40 mg by mouth daily. Notes to patient: Not given this hospitalization   feeding supplement Liqd Take 237 mLs by mouth 2 (two) times daily between meals.   ferrous gluconate 324 MG tablet Commonly known as: FERGON Take 324 mg by mouth daily.   Gerhardt's butt cream Crea Apply 1 application topically 2 (two) times daily.   lovastatin 40 MG tablet Commonly known as: MEVACOR Take 40 mg by mouth 2 (two) times daily.   magnesium chloride 64 MG Tbec SR tablet Commonly known as: SLOW-MAG Take 1 tablet by mouth daily.   methocarbamol 750 MG tablet Commonly known as: ROBAXIN Take 1 tablet (750 mg total) by mouth 3 (three) times daily.   metoprolol tartrate 25 MG tablet Commonly known as: LOPRESSOR Take 25 mg by mouth 2 (two) times daily.   multivitamin with minerals Tabs tablet Take 1 tablet by mouth daily. Start taking on: September 16, 2020   Probiotic Advanced Caps Take 1 capsule by mouth daily.   risedronate 35 MG tablet Commonly known as: ACTONEL TAKE 1 TABLET EVERY 14 DAYS What changed: See the new instructions.   simethicone 80 MG chewable tablet Commonly known as: MYLICON Chew 1 tablet (80 mg total) by mouth 4 (four) times daily as needed for flatulence.   tamoxifen 20 MG tablet Commonly known as: NOLVADEX TAKE 1 TABLET (20 MG TOTAL) BY MOUTH DAILY.   VITAMIN B-12 IJ Inject 1,000 mcg as directed every 30 (thirty) days.  Vitamin D3 25 MCG (1000 UT) Caps Take 1,000 Units by mouth daily.   zinc gluconate 50 MG tablet Take 50 mg by mouth daily.       If you experience worsening of your admission symptoms, develop shortness of breath, life threatening emergency, suicidal or homicidal thoughts you must seek medical attention immediately by calling 911 or calling your MD immediately  if symptoms less severe.  You Must read  complete instructions/literature along with all the possible adverse reactions/side effects for all the Medicines you take and that have been prescribed to you. Take any new Medicines after you have completely understood and accept all the possible adverse reactions/side effects.   Please note  You were cared for by a hospitalist during your hospital stay. If you have any questions about your discharge medications or the care you received while you were in the hospital after you are discharged, you can call the unit and asked to speak with the hospitalist on call if the hospitalist that took care of you is not available. Once you are discharged, your primary care physician will handle any further medical issues. Please note that NO REFILLS for any discharge medications will be authorized once you are discharged, as it is imperative that you return to your primary care physician (or establish a relationship with a primary care physician if you do not have one) for your aftercare needs so that they can reassess your need for medications and monitor your lab values. Today   SUBJECTIVE   Good BM's after fleet's enema Overall improving  VITAL SIGNS:  Blood pressure (!) 143/58, pulse 74, temperature 98.4 F (36.9 C), temperature source Oral, resp. rate 15, height 5\' 4"  (1.626 m), weight 69.1 kg, SpO2 97 %.  I/O:    Intake/Output Summary (Last 24 hours) at 09/15/2020 0841 Last data filed at 09/15/2020 0040 Gross per 24 hour  Intake 120 ml  Output 450 ml  Net -330 ml    PHYSICAL EXAMINATION:  GENERAL:  87 y.o.-year-old patient lying in the bed with no acute distress.  LUNGS: Normal breath sounds bilaterally, no wheezing, rales,rhonchi or crepitation. No use of accessory muscles of respiration.  CARDIOVASCULAR: S1, S2 normal. No murmurs, rubs, or gallops.  ABDOMEN: Soft, non-tender, non-distended. Bowel sounds present. No organomegaly or mass.  EXTREMITIES: No pedal edema, cyanosis, or clubbing.   NEUROLOGIC:non focal. weak PSYCHIATRIC: The patient is alert and oriented x 3.  SKIN: No obvious rash, lesion, or ulcer.   DATA REVIEW:   CBC  Recent Labs  Lab 09/13/20 0540  WBC 7.2  HGB 10.2*  HCT 31.2*  PLT 134*    Chemistries  Recent Labs  Lab 09/12/20 0820 09/13/20 0540 09/14/20 0919  NA 137 138 136  K 4.7 4.5 3.9  CL 104 109 109  CO2 24 22 22   GLUCOSE 99 93 118*  BUN 36* 30* 26*  CREATININE 1.79* 1.44* 1.53*  CALCIUM 13.1* 11.4* 9.9  MG 1.0* 1.5*  --   AST 18  --   --   ALT 6  --   --   ALKPHOS 34*  --   --   BILITOT 0.7  --   --     Microbiology Results   Recent Results (from the past 240 hour(s))  Urine Culture     Status: None   Collection Time: 09/12/20  8:20 AM   Specimen: Urine, Random  Result Value Ref Range Status   Specimen Description   Final  URINE, RANDOM Performed at [facility], [address] [place], [place] [address]    Special Requests   Final    NONE Performed at [facility], [address], [place], [place] [address]    Culture   Final    NO GROWTH Performed at [facility], [place] [address], [place], [place] [address]    Report Status 09/13/2020 FINAL  Final  SARS CORONAVIRUS 2 (TAT 6-24 HRS) Nasopharyngeal Nasopharyngeal Swab     Status: None   Collection Time: 09/12/20  9:56 AM   Specimen: Nasopharyngeal Swab  Result Value Ref Range Status   SARS Coronavirus 2 NEGATIVE NEGATIVE Final    Comment: (NOTE) SARS-CoV-2 target nucleic acids are NOT DETECTED.  The SARS-CoV-2 RNA is generally detectable in upper and lower respiratory specimens during the acute phase of infection. Negative results do not preclude SARS-CoV-2 infection, do not rule out co-infections with other pathogens, and should not be used as the sole basis for treatment or other patient management decisions. Negative results must be combined with clinical observations, patient history, and epidemiological information. The  expected result is Negative.  Fact Sheet for Patients: SugarRoll.be  Fact Sheet for Healthcare Providers: https://www.woods-mathews.com/  This test is not yet approved or cleared by the [place] FDA and  has been authorized for detection and/or diagnosis of SARS-CoV-2 by FDA under an Emergency Use Authorization (EUA). This EUA will remain  in effect (meaning this test can be used) for the duration of the COVID-19 declaration under Se ction 564(b)(1) of the Act, 21 U.S.C. section 360bbb-3(b)(1), unless the authorization is terminated or revoked sooner.  Performed at [facility], [place] [address], [place], [place] [address]     RADIOLOGY:  CT ABDOMEN PELVIS WO CONTRAST  Result Date: 09/14/2020 CLINICAL DATA:  Abdominal and pelvic pain. Constipation. Personal history of breast cancer. EXAM: CT ABDOMEN AND PELVIS WITHOUT CONTRAST TECHNIQUE: Multidetector CT imaging of the abdomen and pelvis was performed following the standard protocol without IV contrast. COMPARISON:  CT scan 12/27/2013 FINDINGS: Lower chest: Progressive fine reticular and mild reticulonodular subpleural interstitial accentuation of both lung bases compared to the 2015 exam. Large hiatal hernia. Mitral valve calcification. Right coronary artery atherosclerotic calcification. Hepatobiliary: Mildly contracted gallbladder. Otherwise unremarkable. Pancreas: Unremarkable Spleen: Unremarkable Adrenals/Urinary Tract: Suspected bilateral renal peripelvic cysts similar to prior. Left kidney upper pole 1.1 cm exophytic lesion with internal density of 19 Hounsfield units, previously 0.9 cm in diameter, probably a cyst but technically nonspecific. No hydronephrosis. Urinary bladder appears unremarkable. Stomach/Bowel: Large hiatal hernia. Prominence of stool in the rectal vault with low position of the anorectal junction compatible with pelvic floor laxity. Increased presacral edema without  bony abnormality observed, this could be a manifestation of low-grade stercoral colitis. In general the amount of stool in the remainder of the colon is within normal limits. Vascular/Lymphatic: Aortoiliac atherosclerotic vascular disease. Reproductive: Uterus absent. Increased prominence of the contour of the left ovary, currently 3.7 by 2.8 by 3.5 cm (volume = 19 cm^3), previously about 1.9 by 1.5 by 2.4 cm (volume = 3.6 cm^3). My sense is that there is likely a septated cystic lesion in the left ovary although densities only 8 Hounsfield units which is only mildly above that of urine in the urinary bladder. Other: No supplemental non-categorized findings. Musculoskeletal: Fatty prominence of the right tensor fascia lata muscle with some fatty atrophy in the right gluteus minimus and medius muscles. Cannulated screw fixation of the right hip.  Severe osteoarthritis of the right hip. Lower thoracic and lumbar spondylosis and degenerative disc disease with multilevel impingement. IMPRESSION: 1. Prominence of stool in the rectal vault with some surrounding perirectal and presacral stranding which could represent low-grade stercoral colitis. There is also low position of the anorectal junction indicating pelvic floor laxity. 2. Increased prominence of the left ovary potentially with a complex internal cystic lesion. A neoplasm is not excluded. The left ovary currently measures 19 cubic cm and was previously 3.6 cubic cm. Dedicated pelvic sonography recommended for further characterization. 3. Fine reticulonodular subpleural interstitial accentuation at the lung bases, possibly early fibrosis, increased from 2015. 4. Other imaging findings of potential clinical significance: Large hiatal hernia. Mitral valve calcification. Aortic Atherosclerosis (ICD10-I70.0). Right coronary artery atherosclerosis. Bilateral renal cysts. Severe right hip osteoarthritis. Multilevel lumbar impingement. Electronically Signed   By: Van Clines M.D.   On: 09/14/2020 11:31     CODE STATUS:     Code Status Orders  (From admission, onward)         Start     Ordered   09/12/20 1018  Do not attempt resuscitation (DNR)  Continuous        09/12/20 1017        Code Status History    Date Active Date Inactive Code Status Order ID Comments User Context   01/02/2020 0059 01/07/2020 2046 Full Code [id number]  Mansy, Arvella Merles, MD ED   Advance Care Planning Activity    Advance Directive Documentation   Flowsheet Row Most Recent Value  Type of Advance Directive Healthcare Power of Attorney, Living will, Out of facility DNR (pink MOST or yellow form)  Pre-existing out of facility DNR order (yellow form or pink MOST form) --  "MOST" Form in Place? --       TOTAL TIME TAKING CARE OF THIS PATIENT: *40* minutes.    Fritzi Mandes M.D  [facility]    CC: Primary care physician; Idelle Crouch, MD

## 2020-09-15 NOTE — Progress Notes (Signed)
Report called to [place] at [facility]. For discharge

## 2020-09-15 NOTE — Plan of Care (Signed)
Patient not understanding with dementia

## 2020-09-19 LAB — PTH-RELATED PEPTIDE: PTH-related peptide: <2 pmol/L

## 2020-09-22 ENCOUNTER — Inpatient Hospital Stay: Payer: PRIVATE HEALTH INSURANCE | Admitting: Oncology

## 2020-10-01 ENCOUNTER — Emergency Department
Admission: EM | Admit: 2020-10-01 | Discharge: 2020-10-01 | Disposition: A | Discharge status: Other Acute Inpatient Hospital | Payer: Medicare (Managed Care) | Attending: Emergency Medicine | Admitting: Emergency Medicine

## 2020-10-01 ENCOUNTER — Emergency Department: Payer: Medicare (Managed Care)

## 2020-10-01 ENCOUNTER — Other Ambulatory Visit: Payer: Self-pay

## 2020-10-01 ENCOUNTER — Encounter: Payer: Self-pay | Admitting: *Deleted

## 2020-10-01 DIAGNOSIS — R0602 Shortness of breath: Secondary | ICD-10-CM | POA: Insufficient documentation

## 2020-10-01 DIAGNOSIS — Z8542 Personal history of malignant neoplasm of other parts of uterus: Secondary | ICD-10-CM | POA: Diagnosis not present

## 2020-10-01 DIAGNOSIS — R079 Chest pain, unspecified: Secondary | ICD-10-CM | POA: Diagnosis present

## 2020-10-01 DIAGNOSIS — I2510 Atherosclerotic heart disease of native coronary artery without angina pectoris: Secondary | ICD-10-CM | POA: Diagnosis not present

## 2020-10-01 DIAGNOSIS — R110 Nausea: Secondary | ICD-10-CM | POA: Insufficient documentation

## 2020-10-01 DIAGNOSIS — Z20822 Contact with and (suspected) exposure to covid-19: Secondary | ICD-10-CM | POA: Insufficient documentation

## 2020-10-01 DIAGNOSIS — K449 Diaphragmatic hernia without obstruction or gangrene: Secondary | ICD-10-CM | POA: Diagnosis not present

## 2020-10-01 DIAGNOSIS — N184 Chronic kidney disease, stage 4 (severe): Secondary | ICD-10-CM | POA: Diagnosis not present

## 2020-10-01 DIAGNOSIS — I129 Hypertensive chronic kidney disease with stage 1 through stage 4 chronic kidney disease, or unspecified chronic kidney disease: Secondary | ICD-10-CM | POA: Diagnosis not present

## 2020-10-01 DIAGNOSIS — Z7901 Long term (current) use of anticoagulants: Secondary | ICD-10-CM | POA: Insufficient documentation

## 2020-10-01 LAB — BASIC METABOLIC PANEL
Anion gap: 11 (ref 5–15)
BUN: 25 mg/dL — ABNORMAL HIGH (ref 8–23)
CO2: 19 mmol/L — ABNORMAL LOW (ref 22–32)
Calcium: 8.5 mg/dL — ABNORMAL LOW (ref 8.9–10.3)
Chloride: 109 mmol/L (ref 98–111)
Creatinine, Ser: 1.62 mg/dL — ABNORMAL HIGH (ref 0.44–1.00)
GFR, Estimated: 31 mL/min — ABNORMAL LOW (ref >60)
Glucose, Bld: 137 mg/dL — ABNORMAL HIGH (ref 70–99)
Potassium: 4.3 mmol/L (ref 3.5–5.1)
Sodium: 139 mmol/L (ref 135–145)

## 2020-10-01 LAB — TROPONIN I (HIGH SENSITIVITY)
Troponin I (High Sensitivity): 7 ng/L (ref <18)
Troponin I (High Sensitivity): 6 ng/L (ref <18)

## 2020-10-01 LAB — RESP PANEL BY RT-PCR (FLU A&B, COVID) ARPGX2
Influenza A by PCR: NEGATIVE
Influenza B by PCR: NEGATIVE
SARS Coronavirus 2 by RT PCR: NEGATIVE

## 2020-10-01 LAB — CBC
HCT: 31.4 % — ABNORMAL LOW (ref 36.0–46.0)
Hemoglobin: 9.9 g/dL — ABNORMAL LOW (ref 12.0–15.0)
MCH: 30.8 pg (ref 26.0–34.0)
MCHC: 31.5 g/dL (ref 30.0–36.0)
MCV: 97.8 fL (ref 80.0–100.0)
Platelets: 200 10*3/uL (ref 150–400)
RBC: 3.21 MIL/uL — ABNORMAL LOW (ref 3.87–5.11)
RDW: 12.7 % (ref 11.5–15.5)
WBC: 9.6 10*3/uL (ref 4.0–10.5)
nRBC: 0 % (ref 0.0–0.2)

## 2020-10-01 LAB — MAGNESIUM: Magnesium: 1.1 mg/dL — ABNORMAL LOW (ref 1.7–2.4)

## 2020-10-01 LAB — PROCALCITONIN: Procalcitonin: <0.1 ng/mL

## 2020-10-01 MED ORDER — MAGNESIUM SULFATE 4 GM/100ML IV SOLN
4.0000 g | Freq: Once | INTRAVENOUS | Status: AC
  Administered 2020-10-01: 4 g via INTRAVENOUS
  Filled 2020-10-01: qty 100

## 2020-10-01 MED ORDER — MAGNESIUM SULFATE 50 % IJ SOLN: 8.0000 g | Freq: Once | INTRAVENOUS | Status: DC

## 2020-10-01 NOTE — ED Triage Notes (Signed)
IN addition to first nurse note, Pt reports SOB and increased pain when taking a deep breath while her chest hurt. Pain has subsided since Nitro administration. Pt also reports nausea and vomiting that has also subsided.

## 2020-10-01 NOTE — ED Triage Notes (Signed)
Pt comes into the ED via [facility] from [facility] c/o chest pain that started today at 1:00.  Pt was prescribed 1 dose of nitro but they gave her 2 doses of nitro.  BP 94/55, once rechecked it came up to 134/80.  NSR Hr at 76.  Denies any current pain but initial pain was left side chest pain and radiating into left shoulder. Pt is currently on Eliquis so no ASA given.

## 2020-10-01 NOTE — ED Provider Notes (Signed)
[facility] Emergency Department Provider Note  ____________________________________________   Event Date/Time   First MD Initiated Contact with Patient 10/01/20 1555     (approximate)  I have reviewed the triage vital signs and the nursing notes.   HISTORY  Chief Complaint Chest Pain and Shortness of Breath   HPI NYASHA RAHILLY is a 87 y.o. female with a past medical history of anemia, arthritis, breast cancer s/p radiation, DVT on Eliquis, CAD, HTN, CKD, OSA, HTN, HDL and chronic fatigue who presents to EMS from [place] facility for assessment of some chest pain that starts afternoon around 1 PM.  Patient was poorly given 2 doses of nitroglycerin and following this was notable pressure of 94/55 which came up to 134/80.  She was not given ASA prior to arrival as she is currently on Eliquis and reportedly has been taking this for.  Patient states that when her chest pain began it felt like a pressure or squeezing rating to her left shoulder and is associate with some nausea and shortness of breath.  She states she was given a nitro and had a headache after this but did not have a headache before but had resolution of her chest pain shortness of breath.  He did get a little nausea earlier but that is also now gone.  She has not had any new abdominal pain, diarrhea, urinary symptoms, rash, extremity pain, earache, sore throat new cough or any other clear acute associated symptoms.  No prior sentences.  No other acute concerns at this time.         Past Medical History:  Diagnosis Date  . Anemia    is followed by renal doctor and pcp  . Arthritis   . Breast cancer ([place]) 04/2016   Rt. breast ca, f/u with radiation  . Cancer [facility]) 1961   uterine cancer  . Coronary artery disease   . Dyspnea   . GERD (gastroesophageal reflux disease)   . Hypertension   . Kidney disease    stage 4.  followed by dr. Sedonia Small  . Lower extremity edema   .  Personal history of radiation therapy   . S/P lumpectomy, right breast    having done on 04/26/16  . Sleep apnea    uses cpap    Patient Active Problem List   Diagnosis Date Noted  . Hypercalcemia 09/12/2020  . Iron deficiency anemia 09/12/2020  . Acute metabolic encephalopathy 78/58/8502  . CAD (coronary artery disease) 09/12/2020  . Hypomagnesemia 09/12/2020  . Closed right hip fracture ([place]) 01/02/2020  . Chronic anticoagulation   . Chronic deep vein thrombosis (DVT) of proximal vein of lower extremity ([place]) 02/06/2019  . B12 deficiency 10/23/2017  . Hydronephrosis 11/18/2016  . Gout 11/18/2016  . Cervical cancer ([place]) 11/18/2016  . Carotid stenosis 04/04/2016  . Primary cancer of upper inner quadrant of right female breast ([place]) 04/04/2016  . Lymphedema 04/04/2016  . Chronic venous insufficiency 04/04/2016  . Chest tightness 02/15/2016  . Obstructive sleep apnea syndrome 08/13/2015  . SOB (shortness of breath) 12/18/2014  . Chronic fatigue 07/09/2014  . CKD (chronic kidney disease), stage IV ([place]) 05/09/2014  . Atypical chest pain 05/09/2014  . Chronic tension-type headache, intractable 03/06/2014  . Headache 02/06/2014  . Facial numbness 02/06/2014  . Rheumatoid arthritis involving multiple sites with positive rheumatoid factor ([place]) 11/01/2013  . Peripheral edema 11/01/2013  . Post herpetic neuralgia 11/01/2013  . Hyperlipidemia 11/01/2013  . HTN (hypertension) 11/01/2013  .  History of recurrent UTIs 11/01/2013  . H/O: hysterectomy 11/01/2013  . GERD (gastroesophageal reflux disease) 11/01/2013  . Chronic headache 11/01/2013  . Anemia 11/01/2013    Past Surgical History:  Procedure Laterality Date  . BREAST BIOPSY Right 03/2016   invasive mammary carcinoma  . BREAST LUMPECTOMY Right    04/26/16, f/u with radiation  . CAROTID STENT Left 2016   was having pain down neck. carotid artery clogged. dr. Delana Meyer put stent in  . CAROTID STENT Left   . CATARACT  EXTRACTION W/ INTRAOCULAR LENS  IMPLANT, BILATERAL Bilateral 2009  . EYE SURGERY  2009   cataracts  . HIP PINNING,CANNULATED Right 01/04/2020   Procedure: CANNULATED HIP PINNING;  Surgeon: Thornton Park, MD;  Location: [facility] ORS;  Service: Orthopedics;  Laterality: Right;  . PARTIAL HYSTERECTOMY  1961   uterus only removed  . PARTIAL MASTECTOMY WITH NEEDLE LOCALIZATION Right 04/26/2016   Procedure: PARTIAL MASTECTOMY WITH NEEDLE LOCALIZATION;  Surgeon: Leonie Green, MD;  Location: [facility] ORS;  Service: General;  Laterality: Right;  . SENTINEL NODE BIOPSY Right 04/26/2016   Procedure: SENTINEL NODE BIOPSY;  Surgeon: Leonie Green, MD;  Location: [facility] ORS;  Service: General;  Laterality: Right;    Prior to Admission medications   Medication Sig Start Date End Date Taking? Authorizing Provider  acetaminophen (TYLENOL) 500 MG tablet Take 1,000 mg by mouth daily. May take an additional 1000 mgs as needed for pain    [provider]  alum & mag hydroxide-simeth (MAALOX/MYLANTA) 200-200-20 MG/5ML suspension Take 30 mLs by mouth every 4 (four) hours as needed for indigestion. 01/07/20   Sreenath, Trula Slade, MD  apixaban (ELIQUIS) 2.5 MG TABS tablet Take 1 tablet by mouth 2 (two) times a day. 12/09/18   [provider]  Cholecalciferol (VITAMIN D3) 1000 units CAPS Take 1,000 Units by mouth daily.     [provider]  Cyanocobalamin (VITAMIN B-12 IJ) Inject 1,000 mcg as directed every 30 (thirty) days.    [provider]  docusate sodium (COLACE) 100 MG capsule Take 100 mg by mouth 2 (two) times daily.    [provider]  esomeprazole (NEXIUM) 40 MG capsule Take 40 mg by mouth daily.    [provider]  feeding supplement (ENSURE ENLIVE / ENSURE PLUS) LIQD Take 237 mLs by mouth 2 (two) times daily between meals. 09/15/20   Fritzi Mandes, MD  ferrous gluconate (FERGON) 324 MG tablet Take 324 mg by mouth daily.    [provider]   lovastatin (MEVACOR) 40 MG tablet Take 40 mg by mouth 2 (two) times daily.    [provider]  magnesium chloride (SLOW-MAG) 64 MG TBEC SR tablet Take 1 tablet by mouth daily.    [provider]  methocarbamol (ROBAXIN) 750 MG tablet Take 1 tablet (750 mg total) by mouth 3 (three) times daily. 01/07/20   Sidney Ace, MD  metoprolol tartrate (LOPRESSOR) 25 MG tablet Take 25 mg by mouth 2 (two) times daily. 11/20/19   [provider]  Multiple Vitamin (MULTIVITAMIN WITH MINERALS) TABS tablet Take 1 tablet by mouth daily. 09/16/20   Fritzi Mandes, MD  Nystatin (GERHARDT'S BUTT CREAM) CREA Apply 1 application topically 2 (two) times daily. 09/15/20   Fritzi Mandes, MD  Probiotic Product (PROBIOTIC ADVANCED) CAPS Take 1 capsule by mouth daily.     [provider]  risedronate (ACTONEL) 35 MG tablet TAKE 1 TABLET EVERY 14 DAYS Patient taking differently: Take 35 mg by mouth  every 14 (fourteen) days. 03/14/19   Lloyd Huger, MD  simethicone (MYLICON) 80 MG chewable tablet Chew 1 tablet (80 mg total) by mouth 4 (four) times daily as needed for flatulence. 01/07/20   Sidney Ace, MD  tamoxifen (NOLVADEX) 20 MG tablet TAKE 1 TABLET (20 MG TOTAL) BY MOUTH DAILY. 07/05/19   Lloyd Huger, MD  zinc gluconate 50 MG tablet Take 50 mg by mouth daily.    [provider]    Allergies Cefuroxime axetil, Codeine, and Sulfa antibiotics  Family History  Problem Relation Age of Onset  . Bladder Cancer Neg Hx   . Kidney cancer Neg Hx   . Prolactinoma Neg Hx   . Prostate cancer Neg Hx     Social History Social History   Tobacco Use  . Smoking status: Never Smoker  . Smokeless tobacco: Never Used  Substance Use Topics  . Alcohol use: No  . Drug use: No    Review of Systems  Review of Systems  Constitutional: Positive for malaise/fatigue. Negative for chills and fever.  HENT: Negative for sore throat.   Eyes: Negative for pain.   Respiratory: Positive for shortness of breath. Negative for cough and stridor.   Cardiovascular: Positive for chest pain.  Gastrointestinal: Positive for nausea. Negative for vomiting.  Genitourinary: Negative for dysuria.  Musculoskeletal: Negative for myalgias.  Skin: Negative for rash.  Neurological: Positive for headaches. Negative for seizures and loss of consciousness.  Psychiatric/Behavioral: Negative for suicidal ideas.  All other systems reviewed and are negative.     ____________________________________________   PHYSICAL EXAM:  VITAL SIGNS: ED Triage Vitals  Enc Vitals Group     BP 10/01/20 1507 113/65     Pulse Rate 10/01/20 1507 78     Resp 10/01/20 1507 16     Temp 10/01/20 1526 98.2 F (36.8 C)     Temp Source 10/01/20 1526 Oral     SpO2 10/01/20 1507 97 %     Weight 10/01/20 1507 152 lb 5.4 oz (69.1 kg)     Height 10/01/20 1507 5\' 4"  (1.626 m)     Head Circumference --      Peak Flow --      Pain Score 10/01/20 1507 0     Pain Loc --      Pain Edu? --      Excl. in [place]? --    Vitals:   10/01/20 1730 10/01/20 1800  BP: 115/76 134/65  Pulse: 71 71  Resp: 18 (!) 21  Temp:    SpO2: 98% 97%   Physical Exam Vitals and nursing note reviewed.  Constitutional:      General: She is not in acute distress.    Appearance: She is well-developed.  HENT:     Head: Normocephalic and atraumatic.  Eyes:     Conjunctiva/sclera: Conjunctivae normal.  Cardiovascular:     Rate and Rhythm: Normal rate and regular rhythm.     Heart sounds: No murmur heard.   Pulmonary:     Effort: Pulmonary effort is normal. No respiratory distress.     Breath sounds: Normal breath sounds.  Abdominal:     Palpations: Abdomen is soft.     Tenderness: There is no abdominal tenderness.  Musculoskeletal:     Cervical back: Neck supple.  Skin:    General: Skin is warm and dry.  Neurological:     Mental Status: She is alert.       ____________________________________________   Reva Bores (  all labs ordered are listed, but only abnormal results are displayed)  Labs Reviewed  BASIC METABOLIC PANEL - Abnormal; Notable for the following components:      Result Value   CO2 19 (*)    Glucose, Bld 137 (*)    BUN 25 (*)    Creatinine, Ser 1.62 (*)    Calcium 8.5 (*)    GFR, Estimated 31 (*)    All other components within normal limits  CBC - Abnormal; Notable for the following components:   RBC 3.21 (*)    Hemoglobin 9.9 (*)    HCT 31.4 (*)    All other components within normal limits  MAGNESIUM - Abnormal; Notable for the following components:   Magnesium 1.1 (*)    All other components within normal limits  RESP PANEL BY RT-PCR (FLU A&B, COVID) ARPGX2  PROCALCITONIN  TROPONIN I (HIGH SENSITIVITY)  TROPONIN I (HIGH SENSITIVITY)   ____________________________________________  EKG  Sinus rhythm with ventricular to 75, left axis deviation, unremarkable intervals with some nonspecific changes in anterior lateral and inferior leads versus artifact. ____________________________________________  RADIOLOGY  ED MD interpretation: There is a large hiatal hernia without pneumothorax or large effusion, overt edema or clear focal consolidation.  No other clear acute thoracic process.   Official radiology report(s): DG Chest 2 View  Result Date: 10/01/2020 CLINICAL DATA:  Shortness of breath EXAM: CHEST - 2 VIEW COMPARISON:  09/12/2020 FINDINGS: Cardiac shadow is stable. Hiatal hernia is again noted. Aortic calcifications are seen. The lungs are clear bilaterally. No bony abnormality is seen. IMPRESSION: Large hiatal hernia stable from the prior exam. No other focal abnormality is noted. Electronically Signed   By: Inez Catalina M.D.   On: 10/01/2020 16:19    ____________________________________________   PROCEDURES  Procedure(s) performed (including Critical Care):  .1-3 Lead EKG Interpretation Performed by:  Lucrezia Starch, MD Authorized by: Lucrezia Starch, MD     Interpretation: normal     ECG rate assessment: normal     Rhythm: sinus rhythm     Ectopy: none     Conduction: normal       ____________________________________________   INITIAL IMPRESSION / ASSESSMENT AND PLAN / ED COURSE        Patient presents with above-stated history and exam for assessment of some chest pain shortness of breath started this afternoon.  He was given some nitroglycerin and has some nausea as well.  On arrival she was afebrile and hemodynamically stable.  Differential includes ACS, arrhythmia, PE, pneumonia, thorax, effusion, edema, pericarditis, dissection, myocarditis GI etiologies metabolic arrangements and anemia.  Chest x-ray remarkable for a large hiatal hernia without pneumothorax or large effusion, overt edema or clear focal consolidation.  No other clear acute thoracic process.   She has no fever cough or findings on x-ray to suggest pneumonia at this time.  CBC otherwise shows hemoglobin 9.9 compared to 10.22 weeks ago and I have a low suspicion for acute symptomatic anemia.  There is no pulmonary edema or other findings on history or exam to suggest acute heart failure exacerbation at this time.  BMP remarkable for kidney function at baseline without any other significant electrolyte or metabolic derangements.  Troponin x2 are nonelevated and given resolution of chest pain with otherwise relatively reassuring EKG Evalose patient for ACS or myocarditis.  Low suspicion for PE as she states she is compliant with her Eliquis.  COVID and flu is negative procalcitonin is undetectable.  Certainly possible may have had  some symptoms related to her hiatal hernia after meal as it seems her symptoms started 30 minutes to an hour or so after she ate.  Given stable vitals with eyes reassuring exam work-up with patient denying any symptoms on my reassessment I think she is safe for discharge with outpatient  evaluation.  She is already on p.o. magnesium supplementation and was given some IV magnesium in the emergency room.  Advised to have this rechecked in 24 to 40 hours and she is amenable to this plan.  Discharged stable condition.  Strict return precautions advised and discussed.     ____________________________________________   FINAL CLINICAL IMPRESSION(S) / ED DIAGNOSES  Final diagnoses:  Chest pain, unspecified type  Hypomagnesemia  Hiatal hernia    Medications  magnesium sulfate IVPB 4 g 100 mL (4 g Intravenous New Bag/Given 10/01/20 1718)  magnesium sulfate IVPB 4 g 100 mL (has no administration in time range)     ED Discharge Orders    None       Note:  This document was prepared using Dragon voice recognition software and may include unintentional dictation errors.   Lucrezia Starch, MD 10/01/20 [phone]

## 2020-10-01 NOTE — ED Notes (Signed)
[facility] called to transport to [facility]

## 2020-10-01 NOTE — ED Notes (Signed)
Lab present to draw blood

## 2020-10-01 NOTE — ED Notes (Signed)
Pt resting in bed at this time, family at bedside. Pt given [place] tray at this time. No needs or concerns voiced. Updated them on their care. Mag will be done approx 2130. Pt voices understanding.

## 2021-07-05 ENCOUNTER — Emergency Department
Admission: EM | Admit: 2021-07-05 | Discharge: 2021-07-05 | Disposition: A | Discharge status: Home / Self Care | Payer: Medicare (Managed Care) | Attending: Emergency Medicine | Admitting: Emergency Medicine

## 2021-07-05 ENCOUNTER — Emergency Department: Payer: Medicare (Managed Care)

## 2021-07-05 ENCOUNTER — Other Ambulatory Visit: Payer: Self-pay

## 2021-07-05 DIAGNOSIS — K219 Gastro-esophageal reflux disease without esophagitis: Secondary | ICD-10-CM | POA: Diagnosis not present

## 2021-07-05 DIAGNOSIS — I10 Essential (primary) hypertension: Secondary | ICD-10-CM | POA: Insufficient documentation

## 2021-07-05 DIAGNOSIS — Z7901 Long term (current) use of anticoagulants: Secondary | ICD-10-CM | POA: Insufficient documentation

## 2021-07-05 DIAGNOSIS — R1013 Epigastric pain: Secondary | ICD-10-CM

## 2021-07-05 DIAGNOSIS — R079 Chest pain, unspecified: Secondary | ICD-10-CM | POA: Diagnosis present

## 2021-07-05 LAB — CBC WITH DIFFERENTIAL/PLATELET
Abs Immature Granulocytes: 0.02 10*3/uL (ref 0.00–0.07)
Basophils Absolute: 0 10*3/uL (ref 0.0–0.1)
Basophils Relative: 1 %
Eosinophils Absolute: 0.3 10*3/uL (ref 0.0–0.5)
Eosinophils Relative: 4 %
HCT: 35.6 % — ABNORMAL LOW (ref 36.0–46.0)
Hemoglobin: 10.9 g/dL — ABNORMAL LOW (ref 12.0–15.0)
Immature Granulocytes: 0 %
Lymphocytes Relative: 31 %
Lymphs Abs: 2.1 10*3/uL (ref 0.7–4.0)
MCH: 32 pg (ref 26.0–34.0)
MCHC: 30.6 g/dL (ref 30.0–36.0)
MCV: 104.4 fL — ABNORMAL HIGH (ref 80.0–100.0)
Monocytes Absolute: 0.6 10*3/uL (ref 0.1–1.0)
Monocytes Relative: 9 %
Neutro Abs: 3.6 10*3/uL (ref 1.7–7.7)
Neutrophils Relative %: 55 %
Platelets: 179 10*3/uL (ref 150–400)
RBC: 3.41 MIL/uL — ABNORMAL LOW (ref 3.87–5.11)
RDW: 12 % (ref 11.5–15.5)
WBC: 6.6 10*3/uL (ref 4.0–10.5)
nRBC: 0 % (ref 0.0–0.2)

## 2021-07-05 LAB — COMPREHENSIVE METABOLIC PANEL
ALT: 10 U/L (ref 0–44)
AST: 19 U/L (ref 15–41)
Albumin: 3.2 g/dL — ABNORMAL LOW (ref 3.5–5.0)
Alkaline Phosphatase: 40 U/L (ref 38–126)
Anion gap: 5 (ref 5–15)
BUN: 37 mg/dL — ABNORMAL HIGH (ref 8–23)
CO2: 23 mmol/L (ref 22–32)
Calcium: 8.8 mg/dL — ABNORMAL LOW (ref 8.9–10.3)
Chloride: 111 mmol/L (ref 98–111)
Creatinine, Ser: 1.53 mg/dL — ABNORMAL HIGH (ref 0.44–1.00)
GFR, Estimated: 33 mL/min — ABNORMAL LOW (ref >60)
Glucose, Bld: 113 mg/dL — ABNORMAL HIGH (ref 70–99)
Potassium: 5.2 mmol/L — ABNORMAL HIGH (ref 3.5–5.1)
Sodium: 139 mmol/L (ref 135–145)
Total Bilirubin: 0.7 mg/dL (ref 0.3–1.2)
Total Protein: 5.9 g/dL — ABNORMAL LOW (ref 6.5–8.1)

## 2021-07-05 LAB — TROPONIN I (HIGH SENSITIVITY)
Troponin I (High Sensitivity): 6 ng/L (ref <18)
Troponin I (High Sensitivity): 6 ng/L (ref <18)

## 2021-07-05 LAB — LIPASE, BLOOD: Lipase: 40 U/L (ref 11–51)

## 2021-07-05 MED ORDER — ALUM & MAG HYDROXIDE-SIMETH 200-200-20 MG/5ML PO SUSP
30.0000 mL | Freq: Once | ORAL | Status: AC
  Administered 2021-07-05: 30 mL via ORAL
  Filled 2021-07-05: qty 30

## 2021-07-05 MED ORDER — FAMOTIDINE 20 MG PO TABS
40.0000 mg | ORAL_TABLET | Freq: Once | ORAL | Status: AC
  Administered 2021-07-05: 40 mg via ORAL
  Filled 2021-07-05: qty 2

## 2021-07-05 NOTE — Discharge Instructions (Signed)
Your chest x-ray, EKG, and lab tests in the emergency department are all okay.  Continue taking Maalox and Pepcid as needed to control acid reflux symptoms.

## 2021-07-05 NOTE — ED Notes (Signed)
This RN called [facility] at [phone], no answer at either extension for speaking with an on-duty nurse.

## 2021-07-05 NOTE — ED Notes (Signed)
This RN spoke with Theadora Rama, NP at [facility]. Theadora Rama would like pt to return back to [facility] if she does not need surgical intervention. If MD has any questions/concerns, they can contact Brandy at [phone]. Theadora Rama will be at the facility tomorrow & can definitely follow up with pt.

## 2021-07-05 NOTE — ED Notes (Addendum)
This RN called [facility] at [phone] & spoke with Sharyn Lull, Therapist, sports. Report given. All questions answered.

## 2021-07-05 NOTE — ED Notes (Signed)
Dr. Stafford at bedside.  

## 2021-07-05 NOTE — ED Notes (Signed)
Light green top tube sent to lab for repeat troponin.  EKG given to Dr. Joni Fears.

## 2021-07-05 NOTE — ED Provider Notes (Signed)
[facility] Provider Note    Event Date/Time   First MD Initiated Contact with Patient 07/05/21 1911     (approximate)   History   Chest pain  HPI  Rachel Jones is a 88 y.o. female with a past history of DVT on Eliquis, hypertension, carotid stenosis who comes ED complaining of chest pain that started at 5:30 PM.  She ate dinner, a "[place]," at 5:00 PM, which she expected would give her "gas," and afterwards she ate some yogurt and crackers.  However at 530 she started having chest pain which she indicates in her epigastrium.  It feels like tightness, nonradiating, no shortness of breath diaphoresis or vomiting.  Not exertional, not pleuritic.  No fever.  It is constant, but currently mild, improving.  Feels like previous episodes of indigestion.   Physical Exam   Triage Vital Signs: ED Triage Vitals  Enc Vitals Group     BP      Pulse      Resp      Temp      Temp src      SpO2      Weight      Height      Head Circumference      Peak Flow      Pain Score      Pain Loc      Pain Edu?      Excl. in [place]?     Most recent vital signs: Vitals:   07/05/21 2130 07/05/21 2200  BP: (!) 143/79 (!) 150/61  Pulse: 68 67  Resp: (!) 24 20  Temp:    SpO2: 98% 98%     General: Awake, no distress.  CV:  Good peripheral perfusion.  Symmetric pulses, regular rate rhythm Resp:  Normal effort.  Clear to auscultation bilaterally Abd:  No distention.  Soft with mild epigastric and left upper quadrant tenderness Other:  No lower extremity edema or calf tenderness or calf swelling, no inflammatory skin changes.  There is chronic deformity of the right ankle   ED Results / Procedures / Treatments   Labs (all labs ordered are listed, but only abnormal results are displayed) Labs Reviewed  COMPREHENSIVE METABOLIC PANEL - Abnormal; Notable for the following components:      Result Value   Potassium 5.2 (*)    Glucose, Bld 113 (*)    BUN 37  (*)    Creatinine, Ser 1.53 (*)    Calcium 8.8 (*)    Total Protein 5.9 (*)    Albumin 3.2 (*)    GFR, Estimated 33 (*)    All other components within normal limits  CBC WITH DIFFERENTIAL/PLATELET - Abnormal; Notable for the following components:   RBC 3.41 (*)    Hemoglobin 10.9 (*)    HCT 35.6 (*)    MCV 104.4 (*)    All other components within normal limits  LIPASE, BLOOD  TROPONIN I (HIGH SENSITIVITY)  TROPONIN I (HIGH SENSITIVITY)     EKG  EKG interpreted by me Normal sinus rhythm, rate of 79.  Poor R wave progression.  Normal ST segments and T waves.  No ischemic changes.  Repeat EKG interpreted by me, sinus rhythm rate of 71, no interval change.   RADIOLOGY Chest x-ray viewed and interpreted by me, unremarkable.  Radiology report reviewed    PROCEDURES:  Critical Care performed: No  Procedures   MEDICATIONS ORDERED IN ED: Medications  alum & mag hydroxide-simeth (  MAALOX/MYLANTA) 200-200-20 MG/5ML suspension 30 mL (30 mLs Oral Given 07/05/21 1958)  famotidine (PEPCID) tablet 40 mg (40 mg Oral Given 07/05/21 1958)     IMPRESSION / MDM / ASSESSMENT AND PLAN / ED COURSE  I reviewed the triage vital signs and the nursing notes.                              Differential diagnosis includes, but is not limited to, GERD, pancreatitis, biliary disease, non-STEMI  Patient presents with epigastric pain highly suspicious for GERD.  However, due to age and comorbidities, will obtain chest x-ray EKG and labs including serial troponins.  Will give Maalox and Pepcid in the meantime.   ----------------------------------------- 10:10 PM on 07/05/2021 ----------------------------------------- Serial troponins are normal, patient not requiring admission since work-up is unremarkable.  She can be discharged home, continue antacids, follow-up with primary care.      FINAL CLINICAL IMPRESSION(S) / ED DIAGNOSES   Final diagnoses:  Epigastric pain  Gastroesophageal  reflux disease, unspecified whether esophagitis present     Rx / DC Orders   ED Discharge Orders     None        Note:  This document was prepared using Dragon voice recognition software and may include unintentional dictation errors.   Carrie Mew, MD 07/05/21 2210

## 2021-07-05 NOTE — ED Notes (Signed)
This RN called [facility] at [phone] & attempted to get ahold of an on-duty nurse (at both extensions) to give report without success. Unable to give report at this time on this pt returning to facility.

## 2021-07-05 NOTE — ED Notes (Signed)
Pt states CP is gone now.

## 2021-07-05 NOTE — ED Triage Notes (Signed)
Pt BIB EMS from [facility] for CP, L sided radiating to L shoulder; sudden onset. BP elevated for staff & EMS.

## 2021-07-05 NOTE — ED Notes (Signed)
Family at BS

## 2021-07-05 NOTE — ED Notes (Signed)
Pt transported to xray via stretcher with xray tech.

## 2021-10-04 ENCOUNTER — Telehealth: Payer: Self-pay | Admitting: *Deleted

## 2021-10-04 NOTE — Telephone Encounter (Signed)
Daughter called stating that patient is residing in a care facility now and is not mobile after a hop fracture. She is asking for patient if she can stop her Tamoxifen since she has complete 5 years of it. Patient has not been seen since 2021 which is the last time e ordered her Tamoxifen, so I do not know who has been ordering it. Daughter states that patient cannot come in for an appointment if she needs on, but could do a video visit. And to call her and not her mother for this [phone]. Please advise ?

## 2021-10-05 NOTE — Telephone Encounter (Signed)
Call returned to Ms Rachel Jones and informed of doctor response and she thanked me for letting her know ?

## 2022-03-21 ENCOUNTER — Encounter (INDEPENDENT_AMBULATORY_CARE_PROVIDER_SITE_OTHER): Payer: Self-pay

## 2022-04-28 DIAGNOSIS — Z85828 Personal history of other malignant neoplasm of skin: Secondary | ICD-10-CM | POA: Insufficient documentation

## 2022-08-04 ENCOUNTER — Other Ambulatory Visit: Payer: Self-pay

## 2022-08-09 ENCOUNTER — Encounter: Payer: Self-pay | Admitting: Gastroenterology

## 2022-08-09 ENCOUNTER — Other Ambulatory Visit: Payer: Self-pay

## 2022-08-09 ENCOUNTER — Ambulatory Visit (INDEPENDENT_AMBULATORY_CARE_PROVIDER_SITE_OTHER): Payer: Medicare (Managed Care) | Admitting: Gastroenterology

## 2022-08-09 ENCOUNTER — Telehealth: Payer: Self-pay

## 2022-08-09 VITALS — BP 129/78 | HR 61 | Temp 97.8°F | Ht 62.0 in | Wt 160.0 lb

## 2022-08-09 DIAGNOSIS — R140 Abdominal distension (gaseous): Secondary | ICD-10-CM

## 2022-08-09 DIAGNOSIS — R1319 Other dysphagia: Secondary | ICD-10-CM | POA: Diagnosis not present

## 2022-08-09 NOTE — Telephone Encounter (Signed)
Per Darcus Austin NP patient needs to stop the Eliquis 7 days before procedure and then restart it 1 day after procedure. [employer] at [phone] they page the nurse twice but no one would pick up Silva Bandy asked [place] to fax them to blood thinner clearance to [phone]. Faxed request to them and got confirmation. Called patient daughter and let her know the blood thinner clearance recommendations. Patient daughter verbalized understanding

## 2022-08-09 NOTE — Progress Notes (Signed)
Cephas Darby, MD [address]  [place]  [place], [place] [address]  Main: [phone]  Fax: [phone]    Gastroenterology Consultation  Referring Provider:     Kirk Ruths, MD Primary Care Physician:  Kirk Ruths, MD Primary Gastroenterologist:  Dr. Cephas Darby Reason for Consultation: Dysphagia, gas and loose stools        HPI:   Rachel Jones is a 89 y.o. female referred by Dr. Ouida Sills, Ocie Cornfield, MD  for consultation & management of approximately 6 months history of choking spells to various foods, bread or rice, mostly solids associated with regurgitation or symptoms patient has to sweep with her fingers to regurgitate.  She has been taking omeprazole 20 mg daily, previously on Nexium.  She is also taking Pepcid 20 mg twice daily.  Patient is also concerned about feeling very gassy and her stools are always mushy, resulted in difficulty cleaning.  She states she spends about 30 minutes after a bowel movement to clean herself.  She has been taking magnesium supplements 3 times daily as she was advised for low magnesium levels.  Her lowest magnesium level was 1.1 in 09/2020.  Patient lives at [facility], has blood work done every 6 months.  Patient is accompanied by her daughter today who also states that her magnesium levels are checked on a regular basis Patient denies any rectal bleeding, abdominal pain.  She takes Gas-X to relieve gassy feeling  NSAIDs: None  Antiplts/Anticoagulants/Anti thrombotics: Eliquis for history of DVT, has been on it for more than a year  GI Procedures: None  Past Medical History:  Diagnosis Date   Anemia    is followed by renal doctor and pcp   Arthritis    Breast cancer ([place]) 04/2016   Rt. breast ca, f/u with radiation   Cancer ([place]) 1961   uterine cancer   Coronary artery disease    Dyspnea    GERD (gastroesophageal reflux disease)    Hypertension    Kidney disease    stage 4.  followed by dr.  Sedonia Small   Lower extremity edema    Personal history of radiation therapy    S/P lumpectomy, right breast    having done on 04/26/16   Sleep apnea    uses cpap    Past Surgical History:  Procedure Laterality Date   BREAST BIOPSY Right 03/2016   invasive mammary carcinoma   BREAST LUMPECTOMY Right    04/26/16, f/u with radiation   CAROTID STENT Left 2016   was having pain down neck. carotid artery clogged. dr. Delana Meyer put stent in   CAROTID STENT Left    CATARACT EXTRACTION W/ INTRAOCULAR LENS  IMPLANT, BILATERAL Bilateral 2009   EYE SURGERY  2009   cataracts   HIP PINNING,CANNULATED Right 01/04/2020   Procedure: CANNULATED HIP PINNING;  Surgeon: Thornton Park, MD;  Location: [facility] ORS;  Service: Orthopedics;  Laterality: Right;   PARTIAL HYSTERECTOMY  1961   uterus only removed   PARTIAL MASTECTOMY WITH NEEDLE LOCALIZATION Right 04/26/2016   Procedure: PARTIAL MASTECTOMY WITH NEEDLE LOCALIZATION;  Surgeon: Leonie Green, MD;  Location: [facility] ORS;  Service: General;  Laterality: Right;   SENTINEL NODE BIOPSY Right 04/26/2016   Procedure: SENTINEL NODE BIOPSY;  Surgeon: Leonie Green, MD;  Location: [facility] ORS;  Service: General;  Laterality: Right;     Current Outpatient Medications:    acetaminophen (TYLENOL) 500 MG tablet, Take 1,000 mg by mouth daily. May  take an additional 1000 mgs as needed for pain, Disp: , Rfl:    alum & mag hydroxide-simeth (MAALOX/MYLANTA) 200-200-20 MG/5ML suspension, Take 30 mLs by mouth every 4 (four) hours as needed for indigestion., Disp: 355 mL, Rfl: 0   apixaban (ELIQUIS) 2.5 MG TABS tablet, Take 1 tablet by mouth 2 (two) times a day., Disp: , Rfl:    Cholecalciferol (VITAMIN D3) 1000 units CAPS, Take 1,000 Units by mouth daily. , Disp: , Rfl:    famotidine (PEPCID) 20 MG tablet, Take 20 mg by mouth 2 (two) times daily., Disp: , Rfl:    ferrous gluconate (FERGON) 324 MG tablet, Take 324 mg by mouth daily., Disp: , Rfl:    furosemide (LASIX)  20 MG tablet, Take 20 mg by mouth as needed., Disp: , Rfl:    loperamide (IMODIUM) 2 MG capsule, Take 4 mg by mouth as needed., Disp: , Rfl:    lovastatin (MEVACOR) 20 MG tablet, Take 20 mg by mouth daily., Disp: , Rfl:    Magnesium 200 MG TABS, Take 200 mg by mouth in the morning, at noon, and at bedtime., Disp: , Rfl:    methocarbamol (ROBAXIN) 750 MG tablet, Take 1 tablet (750 mg total) by mouth 3 (three) times daily., Disp: , Rfl:    metoprolol tartrate (LOPRESSOR) 25 MG tablet, Take 25 mg by mouth 2 (two) times daily., Disp: , Rfl:    Multiple Vitamin (MULTIVITAMIN) capsule, Take 1 capsule by mouth daily., Disp: , Rfl:    nitroGLYCERIN (NITROSTAT) 0.4 MG SL tablet, Place 0.4 mg under the tongue every 5 (five) minutes as needed., Disp: , Rfl:    omeprazole (PRILOSEC) 20 MG capsule, Take 20 mg by mouth daily., Disp: , Rfl:    Probiotic Product (PROBIOTIC ADVANCED) CAPS, Take 1 capsule by mouth daily. , Disp: , Rfl:    simethicone (MYLICON) 0000000 MG chewable tablet, Chew 125 mg by mouth every 6 (six) hours as needed., Disp: , Rfl:    zinc gluconate 50 MG tablet, Take 50 mg by mouth daily., Disp: , Rfl:    Family History  Problem Relation Age of Onset   Bladder Cancer Neg Hx    Kidney cancer Neg Hx    Prolactinoma Neg Hx    Prostate cancer Neg Hx      Social History   Tobacco Use   Smoking status: Never   Smokeless tobacco: Never  Substance Use Topics   Alcohol use: No   Drug use: No    Allergies as of 08/09/2022 - Review Complete 08/09/2022  Allergen Reaction Noted   Cefuroxime axetil Diarrhea 09/30/2016   Codeine Nausea Only 11/01/2013   Sulfa antibiotics Swelling and Rash 11/01/2013    Review of Systems:    All systems reviewed and negative except where noted in HPI.   Physical Exam:  BP (!) 142/83 (BP Location: Left Arm, Patient Position: Sitting, Cuff Size: Normal)   Pulse 69   Temp 97.8 F (36.6 C) (Oral)   Ht 5\' 2"  (1.575 m)   Wt 160 lb (72.6 kg)   BMI 29.26  kg/m  No LMP recorded. Patient is postmenopausal.  General:   Alert,  Well-developed, well-nourished, pleasant and cooperative in NAD Head:  Normocephalic and atraumatic. Eyes:  Sclera clear, no icterus.   Conjunctiva pink. Ears:  Normal auditory acuity. Nose:  No deformity, discharge, or lesions. Mouth:  No deformity or lesions,oropharynx pink & moist. Neck:  Supple; no masses or thyromegaly. Lungs:  Respirations even and unlabored.  Clear throughout to auscultation.   No wheezes, crackles, or rhonchi. No acute distress. Heart:  Regular rate and rhythm; no murmurs, clicks, rubs, or gallops. Abdomen:  Normal bowel sounds. Soft, non-tender and non-distended without masses, hepatosplenomegaly or hernias noted.  No guarding or rebound tenderness.   Rectal: Not performed Msk:  Symmetrical without gross deformities. Good, equal movement & strength bilaterally. Pulses:  Normal pulses noted. Extremities:  No clubbing or edema.  No cyanosis. Neurologic:  Alert and oriented x3;  grossly normal neurologically. Skin:  Intact without significant lesions or rashes. No jaundice. Psych:  Alert and cooperative. Normal mood and affect.  Imaging Studies: Reviewed  Assessment and Plan:   Rachel Jones is a 89 y.o. female with history of DVT on Eliquis, lives in [place], CKD, hypertension, GERD is seen in consultation for difficulty swallowing solids, abdominal gaseous distention and loose stools  Difficulty swallowing Recommend EGD for further evaluation Will obtain clearance for interruption of Eliquis for 3 days before proceeding with upper endoscopy Continue low dose of PPI  Gaseous distention with loose stools Apparently, patient is on magnesium supplements 3 times daily which can lead to diarrhea since it is a stimulant laxative Recheck magnesium levels today, if normal, gradually taper the dose   Follow up based on the above workup   Cephas Darby, MD

## 2022-08-10 ENCOUNTER — Telehealth: Payer: Self-pay

## 2022-08-10 DIAGNOSIS — R140 Abdominal distension (gaseous): Secondary | ICD-10-CM

## 2022-08-10 LAB — MAGNESIUM: Magnesium: 2.1 mg/dL (ref 1.6–2.3)

## 2022-08-10 NOTE — Telephone Encounter (Signed)
[employer] and talk to patient nurse Treynor she states she will document for patient to drop the magnesium to 1 pill daily and get her labs recheck in 1 week. She did ask if we could fax her the results with these instructions on it and the lab order to her at [phone]. Faxed this information to her.   Called daughter and informed her of the results and instructions. She verbalized understanding.

## 2022-08-10 NOTE — Telephone Encounter (Signed)
Called [employer] and talk to [employer] she documented in the computer when to stop the blood thinner and when to restart the blood thinner. She did ask if we could refax the order to them. Refaxed the blood thinner request to her.

## 2022-08-10 NOTE — Telephone Encounter (Signed)
-----   Message from Lin Landsman, MD sent at 08/10/2022  9:55 AM EDT ----- Her serum magnesium levels are 2.1.  She can cut back on magnesium to 1 pill a day.  Please convey the message to her daughter and tell daughter to have the magnesium levels checked at [facility] in 1 week.  They should let us know if her magnesium is below 1.5  RV

## 2022-08-10 NOTE — Telephone Encounter (Signed)
Got confirmation 6 pages went through.

## 2022-08-10 NOTE — Telephone Encounter (Signed)
Got confirmation that 6 pages went through.

## 2022-08-31 ENCOUNTER — Encounter: Payer: Self-pay | Admitting: Gastroenterology

## 2022-09-01 ENCOUNTER — Encounter
Admission: RE | Disposition: A | Discharge status: Home / Self Care | Payer: Self-pay | Source: Home / Self Care | Attending: Gastroenterology

## 2022-09-01 ENCOUNTER — Ambulatory Visit: Payer: Medicare (Managed Care) | Admitting: Anesthesiology

## 2022-09-01 ENCOUNTER — Other Ambulatory Visit
Admission: RE | Admit: 2022-09-01 | Discharge: 2022-09-01 | Disposition: A | Discharge status: Home / Self Care | Payer: Medicare (Managed Care) | Source: Ambulatory Visit | Attending: Gastroenterology | Admitting: Gastroenterology

## 2022-09-01 ENCOUNTER — Other Ambulatory Visit: Payer: Self-pay

## 2022-09-01 ENCOUNTER — Ambulatory Visit
Admission: RE | Admit: 2022-09-01 | Discharge: 2022-09-01 | Disposition: A | Discharge status: Home / Self Care | Payer: Medicare (Managed Care) | Attending: Gastroenterology | Admitting: Gastroenterology

## 2022-09-01 DIAGNOSIS — G4730 Sleep apnea, unspecified: Secondary | ICD-10-CM | POA: Diagnosis not present

## 2022-09-01 DIAGNOSIS — R1319 Other dysphagia: Secondary | ICD-10-CM

## 2022-09-01 DIAGNOSIS — K224 Dyskinesia of esophagus: Secondary | ICD-10-CM | POA: Diagnosis not present

## 2022-09-01 DIAGNOSIS — D649 Anemia, unspecified: Secondary | ICD-10-CM | POA: Insufficient documentation

## 2022-09-01 DIAGNOSIS — K449 Diaphragmatic hernia without obstruction or gangrene: Secondary | ICD-10-CM | POA: Diagnosis not present

## 2022-09-01 DIAGNOSIS — Z08 Encounter for follow-up examination after completed treatment for malignant neoplasm: Secondary | ICD-10-CM | POA: Insufficient documentation

## 2022-09-01 DIAGNOSIS — I272 Pulmonary hypertension, unspecified: Secondary | ICD-10-CM | POA: Diagnosis not present

## 2022-09-01 DIAGNOSIS — Z853 Personal history of malignant neoplasm of breast: Secondary | ICD-10-CM | POA: Diagnosis not present

## 2022-09-01 DIAGNOSIS — Z8542 Personal history of malignant neoplasm of other parts of uterus: Secondary | ICD-10-CM | POA: Diagnosis not present

## 2022-09-01 DIAGNOSIS — N184 Chronic kidney disease, stage 4 (severe): Secondary | ICD-10-CM | POA: Diagnosis not present

## 2022-09-01 DIAGNOSIS — Z923 Personal history of irradiation: Secondary | ICD-10-CM | POA: Insufficient documentation

## 2022-09-01 DIAGNOSIS — R140 Abdominal distension (gaseous): Secondary | ICD-10-CM

## 2022-09-01 DIAGNOSIS — R195 Other fecal abnormalities: Secondary | ICD-10-CM

## 2022-09-01 DIAGNOSIS — I129 Hypertensive chronic kidney disease with stage 1 through stage 4 chronic kidney disease, or unspecified chronic kidney disease: Secondary | ICD-10-CM | POA: Diagnosis not present

## 2022-09-01 DIAGNOSIS — K219 Gastro-esophageal reflux disease without esophagitis: Secondary | ICD-10-CM | POA: Insufficient documentation

## 2022-09-01 DIAGNOSIS — I2510 Atherosclerotic heart disease of native coronary artery without angina pectoris: Secondary | ICD-10-CM | POA: Insufficient documentation

## 2022-09-01 DIAGNOSIS — R1314 Dysphagia, pharyngoesophageal phase: Secondary | ICD-10-CM | POA: Diagnosis present

## 2022-09-01 HISTORY — PX: ESOPHAGOGASTRODUODENOSCOPY (EGD) WITH PROPOFOL: SHX5813

## 2022-09-01 LAB — VITAMIN B12: Vitamin B-12: 421 pg/mL (ref 180–914)

## 2022-09-01 LAB — IRON AND TIBC
Iron: 68 ug/dL (ref 28–170)
Saturation Ratios: 23 % (ref 10.4–31.8)
TIBC: 302 ug/dL (ref 250–450)
UIBC: 234 ug/dL

## 2022-09-01 LAB — FOLATE: Folate: 7.8 ng/mL (ref >5.9)

## 2022-09-01 SURGERY — ESOPHAGOGASTRODUODENOSCOPY (EGD) WITH PROPOFOL
Anesthesia: General

## 2022-09-01 MED ORDER — PROPOFOL 10 MG/ML IV BOLUS
INTRAVENOUS | Status: DC | PRN
  Administered 2022-09-01: 10 mg via INTRAVENOUS
  Administered 2022-09-01: 70 mg via INTRAVENOUS

## 2022-09-01 MED ORDER — LIDOCAINE HCL (CARDIAC) PF 100 MG/5ML IV SOSY
PREFILLED_SYRINGE | INTRAVENOUS | Status: DC | PRN
  Administered 2022-09-01: 50 mg via INTRAVENOUS

## 2022-09-01 MED ORDER — GLYCOPYRROLATE 0.2 MG/ML IJ SOLN
INTRAMUSCULAR | Status: AC
  Filled 2022-09-01: qty 1

## 2022-09-01 MED ORDER — LIDOCAINE HCL (PF) 1 % IJ SOLN
INTRAMUSCULAR | Status: AC
  Filled 2022-09-01: qty 2

## 2022-09-01 MED ORDER — GLYCOPYRROLATE 0.2 MG/ML IJ SOLN
INTRAMUSCULAR | Status: DC | PRN
  Administered 2022-09-01: .1 mg via INTRAVENOUS

## 2022-09-01 MED ORDER — SODIUM CHLORIDE 0.9 % IV SOLN: INTRAVENOUS | Status: DC

## 2022-09-01 MED ORDER — PROPOFOL 10 MG/ML IV BOLUS
INTRAVENOUS | Status: AC
  Filled 2022-09-01: qty 20

## 2022-09-01 MED ORDER — OMEPRAZOLE 20 MG PO CPDR: 20.0000 mg | DELAYED_RELEASE_CAPSULE | Freq: Two times a day (BID) | ORAL | 3 refills | Status: AC

## 2022-09-01 NOTE — Op Note (Signed)
[facility] Gastroenterology Patient Name: Rachel Jones Procedure Date: 09/01/2022 9:43 AM MRN: [id number] Account #: [id number] Date of Birth: [birth year] Admit Type: Outpatient Age: 89 Room: [facility] ENDO ROOM 3 Gender: Female Note Status: Finalized Instrument Name: Upper Endoscope [id number] Procedure:             Upper GI endoscopy Indications:           Esophageal dysphagia Providers:             Toney Reil MD, MD Referring MD:          No Local Md, MD (Referring MD) Medicines:             General Anesthesia Complications:         No immediate complications. Estimated blood loss: None. Procedure:             Pre-Anesthesia Assessment:                        - Prior to the procedure, a History and Physical was                         performed, and patient medications and allergies were                         reviewed. The patient is competent. The risks and                         benefits of the procedure and the sedation options and                         risks were discussed with the patient. All questions                         were answered and informed consent was obtained.                         Patient identification and proposed procedure were                         verified by the physician, the nurse, the                         anesthesiologist, the anesthetist and the technician                         in the pre-procedure area in the procedure room in the                         endoscopy suite. Mental Status Examination: alert and                         oriented. Airway Examination: normal oropharyngeal                         airway and neck mobility. Respiratory Examination:                         clear to auscultation. CV Examination: normal.  Prophylactic Antibiotics: The patient does not require                         prophylactic antibiotics. Prior Anticoagulants: The                         patient has  taken Eliquis (apixaban), last dose was 3                         days prior to procedure. ASA Grade Assessment: III - A                         patient with severe systemic disease. After reviewing                         the risks and benefits, the patient was deemed in                         satisfactory condition to undergo the procedure. The                         anesthesia plan was to use general anesthesia.                         Immediately prior to administration of medications,                         the patient was re-assessed for adequacy to receive                         sedatives. The heart rate, respiratory rate, oxygen                         saturations, blood pressure, adequacy of pulmonary                         ventilation, and response to care were monitored                         throughout the procedure. The physical status of the                         patient was re-assessed after the procedure.                        After obtaining informed consent, the endoscope was                         passed under direct vision. Throughout the procedure,                         the patient's blood pressure, pulse, and oxygen                         saturations were monitored continuously. The Endoscope                         was introduced through the mouth, and advanced to  the                         third part of duodenum. The upper GI endoscopy was                         accomplished without difficulty. The patient tolerated                         the procedure well. Findings:      The duodenal bulb, second portion of the duodenum and third portion of       the duodenum were normal.      A large hiatal hernia was found. The proximal extent of the gastric       folds (end of tubular esophagus) was 29 cm from the incisors. The hiatal       narrowing was 38 cm from the incisors. The Z-line was 29 cm from the       incisors.      The gastroesophageal junction  and examined esophagus were normal.      Abnormal motility was noted in the esophagus. The cricopharyngeus was       normal. Tertiary peristaltic waves are noted. Impression:            - Normal duodenal bulb, second portion of the duodenum                         and third portion of the duodenum.                        - Large hiatal hernia.                        - Normal gastroesophageal junction and esophagus.                        - Abnormal esophageal motility, consistent with                         presbyesophagus.                        - No specimens collected. Recommendation:        - Discharge patient to a nursing home (with escort).                        - Chopped diet and mechanical soft diet indefinitely.                        - Follow an antireflux regimen for the rest of the                         patient's life.                        - Use Prilosec (omeprazole) 20 mg PO BID for the rest                         of the patient's life. Procedure Code(s):     --- Professional ---  [id number], Esophagogastroduodenoscopy, flexible,                         transoral; diagnostic, including collection of                         specimen(s) by brushing or washing, when performed                         (separate procedure) Diagnosis Code(s):     --- Professional ---                        K44.9, Diaphragmatic hernia without obstruction or                         gangrene                        K22.4, Dyskinesia of esophagus                        R13.14, Dysphagia, pharyngoesophageal phase CPT copyright 2022 American Medical Association. All rights reserved. The codes documented in this report are preliminary and upon coder review may  be revised to meet current compliance requirements. Dr. Libby Mawonini Ivanka Kirshner Toney Reilohini Reddy Jamyron Redd MD, MD 09/01/2022 10:10:30 AM This report has been signed electronically. Number of Addenda: 0 Note Initiated On: 09/01/2022 9:43  AM Estimated Blood Loss:  Estimated blood loss: none.      [facility]

## 2022-09-01 NOTE — Anesthesia Preprocedure Evaluation (Signed)
Anesthesia Evaluation  Patient identified by MRN, date of birth, ID band Patient awake    Reviewed: Allergy & Precautions, H&P , NPO status , Patient's Chart, lab work & pertinent test results, reviewed documented beta blocker date and time   History of Anesthesia Complications Negative for: history of anesthetic complications  Airway Mallampati: III  TM Distance: >3 FB Neck ROM: full    Dental  (+) Upper Dentures, Lower Dentures   Pulmonary shortness of breath, sleep apnea , neg COPD, Patient abstained from smoking.Not current smoker   Pulmonary exam normal breath sounds clear to auscultation       Cardiovascular Exercise Tolerance: Good METShypertension, pulmonary hypertension+ CAD and + Peripheral Vascular Disease  (-) Past MI Normal cardiovascular exam(-) dysrhythmias  Rhythm:Regular Rate:Normal  TTE 2020: INTERPRETATION  NORMAL LEFT VENTRICULAR SYSTOLIC FUNCTION   WITH MILD LVH  NORMAL RIGHT VENTRICULAR SYSTOLIC FUNCTION  MILD VALVULAR REGURGITATION (See above)  NO VALVULAR STENOSIS  MILD TR, PR  TRIVIAL MR  EF 50-55%  Closest EF: >55% (Estimated)  LVH: MILD LVH  Mitral: TRIVIAL MR  Tricuspid: MILD TR     Neuro/Psych  Headaches  negative psych ROS   GI/Hepatic ,GERD  Medicated and Controlled,,(+)     (-) substance abuse    Endo/Other  neg diabetes    Renal/GU CRFRenal disease     Musculoskeletal  (+) Arthritis ,    Abdominal   Peds  Hematology  (+) Blood dyscrasia, anemia   Anesthesia Other Findings Past Medical History: No date: Anemia     Comment:  is followed by renal doctor and pcp No date: Arthritis 04/2016: Breast cancer ([facility])     Comment:  Rt. breast ca, f/u with radiation 1961: Cancer ([facility])     Comment:  uterine cancer No date: Coronary artery disease No date: Dyspnea No date: GERD (gastroesophageal reflux disease) No date: Hypertension No date: Kidney disease     Comment:  stage  4.  followed by dr. Clyda Hurdle No date: Lower extremity edema No date: Personal history of radiation therapy No date: S/P lumpectomy, right breast     Comment:  having done on 04/26/16 No date: Sleep apnea     Comment:  uses cpap   Reproductive/Obstetrics                              Anesthesia Physical Anesthesia Plan  ASA: 3  Anesthesia Plan: General   Post-op Pain Management: Minimal or no pain anticipated   Induction: Intravenous  PONV Risk Score and Plan: 2 and Propofol infusion, TIVA and Ondansetron  Airway Management Planned: Nasal Cannula  Additional Equipment: None  Intra-op Plan:   Post-operative Plan:   Informed Consent: I have reviewed the patients History and Physical, chart, labs and discussed the procedure including the risks, benefits and alternatives for the proposed anesthesia with the patient or authorized representative who has indicated his/her understanding and acceptance.     Dental advisory given  Plan Discussed with: CRNA and Surgeon  Anesthesia Plan Comments: (Discussed risks of anesthesia with patient, including possibility of difficulty with spontaneous ventilation under anesthesia necessitating airway intervention, PONV, and rare risks such as cardiac or respiratory or neurological events, and allergic reactions. Discussed the role of CRNA in patient's perioperative care. Patient understands.)         Anesthesia Quick Evaluation

## 2022-09-01 NOTE — Progress Notes (Signed)
Per Dr. Allegra Lai: please order iron panel,H pyllori breath test, B12 and folate, she [facility]  DX anemia, loose stools, bloating   Order blood work

## 2022-09-01 NOTE — Transfer of Care (Signed)
Immediate Anesthesia Transfer of Care Note  Patient: Rachel Jones  Procedure(s) Performed: ESOPHAGOGASTRODUODENOSCOPY (EGD) WITH PROPOFOL  Patient Location: Endoscopy Unit  Anesthesia Type:General  Level of Consciousness: awake, alert , and oriented  Airway & Oxygen Therapy: Patient Spontanous Breathing  Post-op Assessment: Report given to RN and Post -op Vital signs reviewed and stable  Post vital signs: Reviewed and stable  Last Vitals:  Vitals Value Taken Time  BP 88/61 09/01/22 1009  Temp    Pulse 65 09/01/22 1009  Resp 22 09/01/22 1009  SpO2 100 % 09/01/22 1009  Vitals shown include unvalidated device data.  Last Pain:  Vitals:   09/01/22 0844  TempSrc: Temporal  PainSc: 0-No pain         Complications: No notable events documented.

## 2022-09-01 NOTE — Anesthesia Postprocedure Evaluation (Signed)
Anesthesia Post Note  Patient: Rachel Jones  Procedure(s) Performed: ESOPHAGOGASTRODUODENOSCOPY (EGD) WITH PROPOFOL  Patient location during evaluation: PACU Anesthesia Type: General Level of consciousness: awake and alert Pain management: pain level controlled Vital Signs Assessment: post-procedure vital signs reviewed and stable Respiratory status: spontaneous breathing, nonlabored ventilation, respiratory function stable and patient connected to nasal cannula oxygen Cardiovascular status: blood pressure returned to baseline and stable Postop Assessment: no apparent nausea or vomiting Anesthetic complications: no   No notable events documented.   Last Vitals:  Vitals:   09/01/22 0844 09/01/22 1009  BP: 97/82 (!) 88/61  Pulse: 64   Resp: 16   Temp: (!) 36.1 C (!) 36.1 C  SpO2: 100%     Last Pain:  Vitals:   09/01/22 1029  TempSrc:   PainSc: 0-No pain                 Corinda Gubler

## 2022-09-01 NOTE — H&P (Signed)
Arlyss Repress, MD [address]  [address]  [place], [place] [address]  Main: [phone]  Fax: [phone] Pager: [phone]  Primary Care Physician:  System, Provider Not In Primary Gastroenterologist:  Dr. Arlyss Repress  Pre-Procedure History & Physical: HPI:  Rachel Jones is a 89 y.o. female is here for an endoscopy.   Past Medical History:  Diagnosis Date   Anemia    is followed by renal doctor and pcp   Arthritis    Breast cancer 04/2016   Rt. breast ca, f/u with radiation   Cancer 1961   uterine cancer   Coronary artery disease    Dyspnea    GERD (gastroesophageal reflux disease)    Hypertension    Kidney disease    stage 4.  followed by dr. Clyda Hurdle   Lower extremity edema    Personal history of radiation therapy    S/P lumpectomy, right breast    having done on 04/26/16   Sleep apnea    uses cpap    Past Surgical History:  Procedure Laterality Date   ABDOMINAL HYSTERECTOMY     BREAST BIOPSY Right 03/2016   invasive mammary carcinoma   BREAST LUMPECTOMY Right    04/26/16, f/u with radiation   CAROTID STENT Left 2016   was having pain down neck. carotid artery clogged. dr. Gilda Crease put stent in   CAROTID STENT Left    CATARACT EXTRACTION W/ INTRAOCULAR LENS  IMPLANT, BILATERAL Bilateral 2009   EYE SURGERY  2009   cataracts   HIP PINNING,CANNULATED Right 01/04/2020   Procedure: CANNULATED HIP PINNING;  Surgeon: Juanell Fairly, MD;  Location: [facility];  Service: Orthopedics;  Laterality: Right;   MASTECTOMY     PARTIAL HYSTERECTOMY  1961   uterus only removed   PARTIAL MASTECTOMY WITH NEEDLE LOCALIZATION Right 04/26/2016   Procedure: PARTIAL MASTECTOMY WITH NEEDLE LOCALIZATION;  Surgeon: Nadeen Landau, MD;  Location: [facility];  Service: General;  Laterality: Right;   SENTINEL NODE BIOPSY Right 04/26/2016   Procedure: SENTINEL NODE BIOPSY;  Surgeon: Nadeen Landau, MD;  Location: [facility];  Service: General;  Laterality: Right;     Prior to Admission medications   Medication Sig Start Date End Date Taking? Authorizing Provider  acetaminophen (TYLENOL) 500 MG tablet Take 1,000 mg by mouth daily. May take an additional 1000 mgs as needed for pain   Yes [provider]  alum & mag hydroxide-simeth (MAALOX/MYLANTA) 200-200-20 MG/5ML suspension Take 30 mLs by mouth every 4 (four) hours as needed for indigestion. 01/07/20  Yes Sreenath, Sudheer B, MD  Cholecalciferol (VITAMIN D3) 1000 units CAPS Take 1,000 Units by mouth daily.    Yes [provider]  famotidine (PEPCID) 20 MG tablet Take 20 mg by mouth 2 (two) times daily.   Yes [provider]  ferrous gluconate (FERGON) 324 MG tablet Take 324 mg by mouth daily.   Yes [provider]  furosemide (LASIX) 20 MG tablet Take 20 mg by mouth as needed. 03/05/18  Yes [provider]  loperamide (IMODIUM) 2 MG capsule Take 4 mg by mouth as needed. 01/30/21  Yes [provider]  lovastatin (MEVACOR) 20 MG tablet Take 20 mg by mouth daily. 07/26/22  Yes [provider]  Magnesium 200 MG TABS Take 200 mg by mouth in the morning, at noon, and at bedtime.   Yes [provider]  methocarbamol (ROBAXIN) 750 MG tablet Take 1 tablet (750 mg total) by mouth 3 (three)  times daily. 01/07/20  Yes Sreenath, Sudheer B, MD  metoprolol tartrate (LOPRESSOR) 25 MG tablet Take 25 mg by mouth 2 (two) times daily. 11/20/19  Yes [provider]  Multiple Vitamin (MULTIVITAMIN) capsule Take 1 capsule by mouth daily.   Yes [provider]  omeprazole (PRILOSEC) 20 MG capsule Take 20 mg by mouth daily.   Yes [provider]  Probiotic Product (PROBIOTIC ADVANCED) CAPS Take 1 capsule by mouth daily.    Yes [provider]  simethicone (MYLICON) 125 MG chewable tablet Chew 125 mg by mouth every 6 (six) hours as needed. 01/07/20  Yes [provider]  zinc gluconate 50 MG tablet Take 50 mg by mouth  daily.   Yes [provider]  apixaban (ELIQUIS) 2.5 MG TABS tablet Take 1 tablet by mouth 2 (two) times a day. 12/09/18   [provider]  nitroGLYCERIN (NITROSTAT) 0.4 MG SL tablet Place 0.4 mg under the tongue every 5 (five) minutes as needed.    [provider]    Allergies as of 08/09/2022 - Review Complete 08/09/2022  Allergen Reaction Noted   Cefuroxime axetil Diarrhea 09/30/2016   Codeine Nausea Only 11/01/2013   Sulfa antibiotics Swelling and Rash 11/01/2013    Family History  Problem Relation Age of Onset   Bladder Cancer Neg Hx    Kidney cancer Neg Hx    Prolactinoma Neg Hx    Prostate cancer Neg Hx     Social History   Socioeconomic History   Marital status: Widowed    Spouse name: Not on file   Number of children: Not on file   Years of education: Not on file   Highest education level: Not on file  Occupational History   Not on file  Tobacco Use   Smoking status: Never   Smokeless tobacco: Never  Vaping Use   Vaping Use: Never used  Substance and Sexual Activity   Alcohol use: No   Drug use: No   Sexual activity: Never  Other Topics Concern   Not on file  Social History Narrative   Not on file   Social Determinants of Health   Financial Resource Strain: Not on file  Food Insecurity: Not on file  Transportation Needs: Not on file  Physical Activity: Not on file  Stress: Not on file  Social Connections: Not on file  Intimate Partner Violence: Not on file    Review of Systems: See HPI, otherwise negative ROS  Physical Exam: BP 97/82   Pulse 64   Temp (!) 97 F (36.1 C) (Temporal)   Resp 16   Ht 5\' 2"  (1.575 m)   Wt 72.3 kg   SpO2 100%   BMI 29.14 kg/m  General:   Alert,  pleasant and cooperative in NAD Head:  Normocephalic and atraumatic. Neck:  Supple; no masses or thyromegaly. Lungs:  Clear throughout to auscultation.    Heart:  Regular rate and rhythm. Abdomen:  Soft, nontender and nondistended. Normal  bowel sounds, without guarding, and without rebound.   Neurologic:  Alert and  oriented x4;  grossly normal neurologically.  Impression/Plan: Rachel Jones is here for an endoscopy to be performed for dysphagia  Risks, benefits, limitations, and alternatives regarding  endoscopy have been reviewed with the patient.  Questions have been answered.  All parties agreeable.   Lannette Donath, MD  09/01/2022, 9:13 AM

## 2022-09-02 ENCOUNTER — Encounter: Payer: Self-pay | Admitting: Gastroenterology

## 2022-09-02 LAB — H. PYLORI BREATH TEST: H. pylori UBiT: NEGATIVE

## 2022-09-06 ENCOUNTER — Telehealth: Payer: Self-pay

## 2022-09-06 NOTE — Telephone Encounter (Signed)
-----   Message from Toney Reil, MD sent at 09/05/2022 10:56 PM EDT ----- H. pylori breath test was negative and her iron levels, B12 levels came back normal  RV

## 2022-09-06 NOTE — Telephone Encounter (Signed)
Patient daughter verbalized understanding of results  

## 2023-04-13 ENCOUNTER — Other Ambulatory Visit: Payer: Self-pay

## 2023-04-13 ENCOUNTER — Encounter: Payer: Self-pay | Admitting: Emergency Medicine

## 2023-04-13 ENCOUNTER — Inpatient Hospital Stay
Admission: EM | Admit: 2023-04-13 | Discharge: 2023-04-17 | DRG: 689 | Disposition: A | Discharge status: Skilled Nursing Facility | Payer: Medicare (Managed Care) | Source: Skilled Nursing Facility | Attending: Internal Medicine | Admitting: Internal Medicine

## 2023-04-13 ENCOUNTER — Emergency Department: Payer: Medicare (Managed Care)

## 2023-04-13 DIAGNOSIS — I129 Hypertensive chronic kidney disease with stage 1 through stage 4 chronic kidney disease, or unspecified chronic kidney disease: Secondary | ICD-10-CM | POA: Diagnosis present

## 2023-04-13 DIAGNOSIS — Z882 Allergy status to sulfonamides status: Secondary | ICD-10-CM

## 2023-04-13 DIAGNOSIS — M25552 Pain in left hip: Secondary | ICD-10-CM | POA: Diagnosis present

## 2023-04-13 DIAGNOSIS — R410 Disorientation, unspecified: Secondary | ICD-10-CM | POA: Diagnosis not present

## 2023-04-13 DIAGNOSIS — Z66 Do not resuscitate: Secondary | ICD-10-CM | POA: Diagnosis present

## 2023-04-13 DIAGNOSIS — G928 Other toxic encephalopathy: Secondary | ICD-10-CM | POA: Diagnosis present

## 2023-04-13 DIAGNOSIS — Z853 Personal history of malignant neoplasm of breast: Secondary | ICD-10-CM

## 2023-04-13 DIAGNOSIS — G9341 Metabolic encephalopathy: Secondary | ICD-10-CM | POA: Diagnosis not present

## 2023-04-13 DIAGNOSIS — Z7901 Long term (current) use of anticoagulants: Secondary | ICD-10-CM

## 2023-04-13 DIAGNOSIS — E785 Hyperlipidemia, unspecified: Secondary | ICD-10-CM | POA: Diagnosis present

## 2023-04-13 DIAGNOSIS — Z87440 Personal history of urinary (tract) infections: Secondary | ICD-10-CM

## 2023-04-13 DIAGNOSIS — G4733 Obstructive sleep apnea (adult) (pediatric): Secondary | ICD-10-CM | POA: Diagnosis present

## 2023-04-13 DIAGNOSIS — Z881 Allergy status to other antibiotic agents status: Secondary | ICD-10-CM

## 2023-04-13 DIAGNOSIS — N390 Urinary tract infection, site not specified: Principal | ICD-10-CM | POA: Insufficient documentation

## 2023-04-13 DIAGNOSIS — K5900 Constipation, unspecified: Secondary | ICD-10-CM | POA: Diagnosis present

## 2023-04-13 DIAGNOSIS — M545 Low back pain, unspecified: Secondary | ICD-10-CM | POA: Diagnosis present

## 2023-04-13 DIAGNOSIS — E872 Acidosis, unspecified: Secondary | ICD-10-CM | POA: Diagnosis present

## 2023-04-13 DIAGNOSIS — F0390 Unspecified dementia without behavioral disturbance: Secondary | ICD-10-CM | POA: Diagnosis present

## 2023-04-13 DIAGNOSIS — Z923 Personal history of irradiation: Secondary | ICD-10-CM

## 2023-04-13 DIAGNOSIS — R252 Cramp and spasm: Secondary | ICD-10-CM | POA: Diagnosis present

## 2023-04-13 DIAGNOSIS — Z885 Allergy status to narcotic agent status: Secondary | ICD-10-CM

## 2023-04-13 DIAGNOSIS — B964 Proteus (mirabilis) (morganii) as the cause of diseases classified elsewhere: Secondary | ICD-10-CM | POA: Diagnosis present

## 2023-04-13 DIAGNOSIS — M25551 Pain in right hip: Secondary | ICD-10-CM | POA: Diagnosis present

## 2023-04-13 DIAGNOSIS — N184 Chronic kidney disease, stage 4 (severe): Secondary | ICD-10-CM | POA: Diagnosis present

## 2023-04-13 DIAGNOSIS — N3090 Cystitis, unspecified without hematuria: Principal | ICD-10-CM

## 2023-04-13 DIAGNOSIS — I2510 Atherosclerotic heart disease of native coronary artery without angina pectoris: Secondary | ICD-10-CM | POA: Diagnosis present

## 2023-04-13 DIAGNOSIS — G8929 Other chronic pain: Secondary | ICD-10-CM | POA: Diagnosis present

## 2023-04-13 DIAGNOSIS — Z79899 Other long term (current) drug therapy: Secondary | ICD-10-CM

## 2023-04-13 DIAGNOSIS — M069 Rheumatoid arthritis, unspecified: Secondary | ICD-10-CM | POA: Diagnosis present

## 2023-04-13 DIAGNOSIS — Z993 Dependence on wheelchair: Secondary | ICD-10-CM

## 2023-04-13 DIAGNOSIS — Z86718 Personal history of other venous thrombosis and embolism: Secondary | ICD-10-CM

## 2023-04-13 DIAGNOSIS — K219 Gastro-esophageal reflux disease without esophagitis: Secondary | ICD-10-CM | POA: Diagnosis present

## 2023-04-13 DIAGNOSIS — Z9011 Acquired absence of right breast and nipple: Secondary | ICD-10-CM

## 2023-04-13 DIAGNOSIS — G9340 Encephalopathy, unspecified: Secondary | ICD-10-CM | POA: Diagnosis present

## 2023-04-13 DIAGNOSIS — Z1152 Encounter for screening for COVID-19: Secondary | ICD-10-CM

## 2023-04-13 DIAGNOSIS — D631 Anemia in chronic kidney disease: Secondary | ICD-10-CM | POA: Diagnosis present

## 2023-04-13 DIAGNOSIS — J449 Chronic obstructive pulmonary disease, unspecified: Secondary | ICD-10-CM | POA: Diagnosis present

## 2023-04-13 DIAGNOSIS — Z888 Allergy status to other drugs, medicaments and biological substances status: Secondary | ICD-10-CM

## 2023-04-13 LAB — BLOOD GAS, VENOUS
Acid-base deficit: 9.5 mmol/L — ABNORMAL HIGH (ref 0.0–2.0)
Bicarbonate: 17.1 mmol/L — ABNORMAL LOW (ref 20.0–28.0)
O2 Saturation: 86.7 %
Patient temperature: 37
pCO2, Ven: 39 mm[Hg] — ABNORMAL LOW (ref 44–60)
pH, Ven: 7.25 (ref 7.25–7.43)
pO2, Ven: 52 mm[Hg] — ABNORMAL HIGH (ref 32–45)

## 2023-04-13 LAB — COMPREHENSIVE METABOLIC PANEL
ALT: 9 U/L (ref 0–44)
AST: 13 U/L — ABNORMAL LOW (ref 15–41)
Albumin: 3.2 g/dL — ABNORMAL LOW (ref 3.5–5.0)
Alkaline Phosphatase: 91 U/L (ref 38–126)
Anion gap: 13 (ref 5–15)
BUN: 41 mg/dL — ABNORMAL HIGH (ref 8–23)
CO2: 13 mmol/L — ABNORMAL LOW (ref 22–32)
Calcium: 9.4 mg/dL (ref 8.9–10.3)
Chloride: 114 mmol/L — ABNORMAL HIGH (ref 98–111)
Creatinine, Ser: 1.88 mg/dL — ABNORMAL HIGH (ref 0.44–1.00)
GFR, Estimated: 26 mL/min — ABNORMAL LOW (ref >60)
Glucose, Bld: 98 mg/dL (ref 70–99)
Potassium: 4.7 mmol/L (ref 3.5–5.1)
Sodium: 140 mmol/L (ref 135–145)
Total Bilirubin: 0.5 mg/dL (ref <1.2)
Total Protein: 6.5 g/dL (ref 6.5–8.1)

## 2023-04-13 LAB — URINALYSIS, W/ REFLEX TO CULTURE (INFECTION SUSPECTED)
Bilirubin Urine: NEGATIVE
Glucose, UA: NEGATIVE mg/dL
Ketones, ur: 5 mg/dL — AB
Nitrite: NEGATIVE
Protein, ur: 100 mg/dL — AB
RBC / HPF: >50 RBC/hpf (ref 0–5)
Specific Gravity, Urine: 1.019 (ref 1.005–1.030)
WBC, UA: >50 WBC/hpf (ref 0–5)
pH: 5 (ref 5.0–8.0)

## 2023-04-13 LAB — CBC
HCT: 35.6 % — ABNORMAL LOW (ref 36.0–46.0)
Hemoglobin: 10.8 g/dL — ABNORMAL LOW (ref 12.0–15.0)
MCH: 31.5 pg (ref 26.0–34.0)
MCHC: 30.3 g/dL (ref 30.0–36.0)
MCV: 103.8 fL — ABNORMAL HIGH (ref 80.0–100.0)
Platelets: 287 10*3/uL (ref 150–400)
RBC: 3.43 MIL/uL — ABNORMAL LOW (ref 3.87–5.11)
RDW: 13.8 % (ref 11.5–15.5)
WBC: 7.6 10*3/uL (ref 4.0–10.5)
nRBC: 0 % (ref 0.0–0.2)

## 2023-04-13 LAB — PROTIME-INR
INR: 1.3 — ABNORMAL HIGH (ref 0.8–1.2)
Prothrombin Time: 16.7 s — ABNORMAL HIGH (ref 11.4–15.2)

## 2023-04-13 LAB — RESP PANEL BY RT-PCR (RSV, FLU A&B, COVID)  RVPGX2
Influenza A by PCR: NEGATIVE
Influenza B by PCR: NEGATIVE
Resp Syncytial Virus by PCR: NEGATIVE
SARS Coronavirus 2 by RT PCR: NEGATIVE

## 2023-04-13 MED ORDER — SODIUM CHLORIDE 0.9 % IV SOLN
1.0000 g | INTRAVENOUS | Status: AC
  Administered 2023-04-13: 1 g via INTRAVENOUS
  Filled 2023-04-13: qty 10

## 2023-04-13 MED ORDER — METOPROLOL TARTRATE 25 MG PO TABS
25.0000 mg | ORAL_TABLET | Freq: Two times a day (BID) | ORAL | Status: DC
  Administered 2023-04-13 – 2023-04-17 (×7): 25 mg via ORAL
  Filled 2023-04-13 (×7): qty 1

## 2023-04-13 MED ORDER — METHOCARBAMOL 500 MG PO TABS: 750.0000 mg | ORAL_TABLET | Freq: Three times a day (TID) | ORAL | Status: DC

## 2023-04-13 MED ORDER — LOPERAMIDE HCL 2 MG PO CAPS: 4.0000 mg | ORAL_CAPSULE | ORAL | Status: DC | PRN

## 2023-04-13 MED ORDER — SODIUM BICARBONATE 650 MG PO TABS
650.0000 mg | ORAL_TABLET | Freq: Every day | ORAL | Status: DC
  Administered 2023-04-13 – 2023-04-15 (×3): 650 mg via ORAL
  Filled 2023-04-13 (×3): qty 1

## 2023-04-13 MED ORDER — ONDANSETRON HCL 4 MG/2ML IJ SOLN: 4.0000 mg | Freq: Four times a day (QID) | INTRAMUSCULAR | Status: DC | PRN

## 2023-04-13 MED ORDER — PANTOPRAZOLE SODIUM 40 MG PO TBEC
40.0000 mg | DELAYED_RELEASE_TABLET | Freq: Every day | ORAL | Status: DC
  Administered 2023-04-14 – 2023-04-17 (×3): 40 mg via ORAL
  Filled 2023-04-13 (×3): qty 1

## 2023-04-13 MED ORDER — ONDANSETRON HCL 4 MG/2ML IJ SOLN
4.0000 mg | Freq: Once | INTRAMUSCULAR | Status: AC
  Administered 2023-04-13: 4 mg via INTRAVENOUS
  Filled 2023-04-13: qty 2

## 2023-04-13 MED ORDER — FAMOTIDINE 20 MG PO TABS
20.0000 mg | ORAL_TABLET | Freq: Every day | ORAL | Status: DC
  Administered 2023-04-13 – 2023-04-16 (×4): 20 mg via ORAL
  Filled 2023-04-13 (×4): qty 1

## 2023-04-13 MED ORDER — ACETAMINOPHEN 500 MG PO TABS: 1000.0000 mg | ORAL_TABLET | Freq: Every day | ORAL | Status: DC

## 2023-04-13 MED ORDER — ALUM & MAG HYDROXIDE-SIMETH 200-200-20 MG/5ML PO SUSP: 30.0000 mL | ORAL | Status: DC | PRN

## 2023-04-13 MED ORDER — ORAL CARE MOUTH RINSE: 15.0000 mL | OROMUCOSAL | Status: DC | PRN

## 2023-04-13 MED ORDER — SIMETHICONE 80 MG PO CHEW
80.0000 mg | CHEWABLE_TABLET | Freq: Four times a day (QID) | ORAL | Status: DC | PRN
  Administered 2023-04-14: 80 mg via ORAL
  Filled 2023-04-13 (×2): qty 1

## 2023-04-13 MED ORDER — RISAQUAD PO CAPS
1.0000 | ORAL_CAPSULE | Freq: Every day | ORAL | Status: DC
  Administered 2023-04-14 – 2023-04-17 (×3): 1 via ORAL
  Filled 2023-04-13 (×3): qty 1

## 2023-04-13 MED ORDER — ACETAMINOPHEN 500 MG PO TABS
1000.0000 mg | ORAL_TABLET | Freq: Three times a day (TID) | ORAL | Status: DC | PRN
  Administered 2023-04-13 – 2023-04-15 (×4): 1000 mg via ORAL
  Filled 2023-04-13 (×5): qty 2

## 2023-04-13 MED ORDER — APIXABAN 2.5 MG PO TABS
2.5000 mg | ORAL_TABLET | Freq: Two times a day (BID) | ORAL | Status: DC
  Administered 2023-04-13 – 2023-04-17 (×7): 2.5 mg via ORAL
  Filled 2023-04-13 (×7): qty 1

## 2023-04-13 MED ORDER — ONDANSETRON HCL 4 MG PO TABS: 4.0000 mg | ORAL_TABLET | Freq: Four times a day (QID) | ORAL | Status: DC | PRN

## 2023-04-13 MED ORDER — PRAVASTATIN SODIUM 20 MG PO TABS
20.0000 mg | ORAL_TABLET | Freq: Every day | ORAL | Status: DC
  Administered 2023-04-13 – 2023-04-16 (×3): 20 mg via ORAL
  Filled 2023-04-13 (×4): qty 1

## 2023-04-13 MED ORDER — SODIUM CHLORIDE 0.9 % IV BOLUS
1000.0000 mL | Freq: Once | INTRAVENOUS | Status: AC
  Administered 2023-04-13: 1000 mL via INTRAVENOUS

## 2023-04-13 MED ORDER — SODIUM CHLORIDE 0.9 % IV SOLN
1.0000 g | INTRAVENOUS | Status: DC
  Administered 2023-04-14 – 2023-04-15 (×2): 1 g via INTRAVENOUS
  Filled 2023-04-13 (×2): qty 10

## 2023-04-13 MED ORDER — PROBIOTIC ADVANCED PO CAPS: 1.0000 | ORAL_CAPSULE | Freq: Every day | ORAL | Status: DC

## 2023-04-13 NOTE — ED Provider Notes (Addendum)
[facility] Provider Note    Event Date/Time   First MD Initiated Contact with Patient 04/13/23 [phone]     (approximate)   History   Chief Complaint: Altered Mental Status   HPI  Rachel Jones is a 89 y.o. female with a history of hypertension, GERD, CKD, rheumatoid arthritis who is in a SNF and wheelchair dependent for mobility at baseline who is sent to the ED this morning due to altered mental status.  Since yesterday morning patient has been disoriented, low energy.  This morning also had 1 episode of vomiting.  Complains of chronic hip pain but no acute pain complaints.  Denies headache chest pain or shortness of breath.          Physical Exam   Triage Vital Signs: ED Triage Vitals  Encounter Vitals Group     BP 04/13/23 0809 (!) 151/77     Systolic BP Percentile --      Diastolic BP Percentile --      Pulse Rate 04/13/23 0809 (!) 116     Resp 04/13/23 0809 18     Temp 04/13/23 0809 97.6 F (36.4 C)     Temp Source 04/13/23 0809 Oral     SpO2 04/13/23 0809 96 %     Weight 04/13/23 0810 155 lb (70.3 kg)     Height 04/13/23 0810 5\' 3"  (1.6 m)     Head Circumference --      Peak Flow --      Pain Score 04/13/23 0809 0     Pain Loc --      Pain Education --      Exclude from Growth Chart --     Most recent vital signs: Vitals:   04/13/23 0809 04/13/23 1000  BP: (!) 151/77 (!) 153/72  Pulse: (!) 116 96  Resp: 18 18  Temp: 97.6 F (36.4 C)   SpO2: 96% 100%    General: Awake, no distress.  CV:  Good peripheral perfusion.  Tachycardia heart rate 110 Resp:  Normal effort.  Clear to auscultation bilaterally Abd:  No distention.  Soft nontender.  No CVA tenderness Other:  No lower extremity edema.  Dry oral mucosa.  Cranial nerves III through XII intact.  Moving all extremities.   ED Results / Procedures / Treatments   Labs (all labs ordered are listed, but only abnormal results are displayed) Labs Reviewed  COMPREHENSIVE  METABOLIC PANEL - Abnormal; Notable for the following components:      Result Value   Chloride 114 (*)    CO2 13 (*)    BUN 41 (*)    Creatinine, Ser 1.88 (*)    Albumin 3.2 (*)    AST 13 (*)    GFR, Estimated 26 (*)    All other components within normal limits  CBC - Abnormal; Notable for the following components:   RBC 3.43 (*)    Hemoglobin 10.8 (*)    HCT 35.6 (*)    MCV 103.8 (*)    All other components within normal limits  PROTIME-INR - Abnormal; Notable for the following components:   Prothrombin Time 16.7 (*)    INR 1.3 (*)    All other components within normal limits  URINALYSIS, W/ REFLEX TO CULTURE (INFECTION SUSPECTED) - Abnormal; Notable for the following components:   Color, Urine YELLOW (*)    APPearance CLOUDY (*)    Hgb urine dipstick LARGE (*)    Ketones, ur 5 (*)  Protein, ur 100 (*)    Leukocytes,Ua LARGE (*)    Bacteria, UA FEW (*)    All other components within normal limits  RESP PANEL BY RT-PCR (RSV, FLU A&B, COVID)  [id number]  URINE CULTURE  CBG MONITORING, ED     EKG    RADIOLOGY Chest x-ray interpreted by me, appears unremarkable.  Radiology report reviewed.  CT head images reviewed by me, no obvious intracranial hemorrhage or infarct.  Radiology report reviewed   PROCEDURES:  Procedures   MEDICATIONS ORDERED IN ED: Medications  cefTRIAXone (ROCEPHIN) 1 g in sodium chloride 0.9 % 100 mL IVPB (has no administration in time range)  sodium chloride 0.9 % bolus 1,000 mL (1,000 mLs Intravenous New Bag/Given 04/13/23 1001)  ondansetron (ZOFRAN) injection 4 mg (4 mg Intravenous Given 04/13/23 1001)     IMPRESSION / MDM / ASSESSMENT AND PLAN / ED COURSE  I reviewed the triage vital signs and the nursing notes.  DDx: Dehydration, AKI, electrolyte abnormality, anemia, COVID, influenza, RSV, other viral illness, pneumonia, UTI, intracranial hemorrhage  Patient's presentation is most consistent with acute presentation with potential threat  to life or bodily function.  Patient presents with altered mental status, oriented x 2 currently compared to baseline orientation x 4.  No focal neurologic deficits.  She is on blood thinners.  Will obtain CT head, chest x-ray, labs.  Give IV fluids for clinically apparent dehydration.   ----------------------------------------- 11:03 AM on 04/13/2023 ----------------------------------------- Tachycardia improving with IV fluids.  Blood pressure remained stable.  Serum labs unremarkable with CKD at baseline.  Urinalysis shows a clear UTI.  With signs of delirium, will plan to hospitalize for initiation of antibiotics and hydration and clinical monitoring.  Discussed with her son at bedside who agrees.  Discussed with hospitalist.   ----------------------------------------- 11:12 AM on 04/13/2023 ----------------------------------------- [facility] report available in PACS: IMPRESSION: 1. No hemorrhage or CT evidence of an acute cortical infarct. 2. Sequela of moderate microvascular ischemic change.      FINAL CLINICAL IMPRESSION(S) / ED DIAGNOSES   Final diagnoses:  Cystitis  Confusion  Stage 4 chronic kidney disease (HCC)     Rx / DC Orders   ED Discharge Orders     None        Note:  This document was prepared using Dragon voice recognition software and may include unintentional dictation errors.   Sharman Cheek, MD 04/13/23 1104    Sharman Cheek, MD 04/13/23 [phone]

## 2023-04-13 NOTE — ED Notes (Signed)
Pt to floor via stretcher by ER techs.  Pt alert   family with pt.

## 2023-04-13 NOTE — Progress Notes (Signed)
Pt stated that she hasn't wore CPAP in 3 years, she declined to wear tonight.

## 2023-04-13 NOTE — ED Notes (Signed)
ED TO INPATIENT HANDOFF REPORT  ED Nurse Name and Phone #: [phone]  S Name/Age/Gender Rachel Jones 89 y.o. female Room/Bed: ED16A/ED16A  Code Status   Code Status: Limited: Do not attempt resuscitation (DNR) -DNR-LIMITED -Do Not Intubate/DNI   Home/SNF/Other Home Patient oriented to: self, place, and time Is this baseline? No   Triage Complete: Triage complete  Chief Complaint Encephalopathy [G93.40]  Triage Note First Nurse Note;  Pt via [facility] from [facility]. Staff report AMS since 1000 yesterday and loss of appetite. Pt has had recent changes to her muscle relaxants. Does take Eliquis.Pt is A&Ox3 and more confused but NAD, no hx of dementia.   164/72 BP  98% on RA 91 HR  98 CBG  97.8 oral SR on 12-lead per EMS  Patient to ED via [facility] from [facility] for altered mental status. Son reports staff attempted to wake up patient and they had a hard time compared to normal. Son reports no disorientation at baseline. Patient alert to self and place at this time. Son reports patient had medication change- recently taking muscle relaxants.    Allergies Allergies  Allergen Reactions   Cefuroxime Axetil Diarrhea   Allopurinol    Codeine Nausea Only    Intolerance instead of true allerg   Sulfa Antibiotics Swelling and Rash    swelling    Level of Care/Admitting Diagnosis ED Disposition     ED Disposition  Admit   Condition  --   Comment  Hospital Area: [facility] [[id number]]  Level of Care: Med-Surg [16]  Covid Evaluation: Asymptomatic - no recent exposure (last 10 days) testing not required  Diagnosis: Encephalopathy [956213]  Admitting Physician: Emeline General [[id number]]  Attending Physician: Emeline General [[id number]]          B Medical/Surgery History Past Medical History:  Diagnosis Date   Anemia    is followed by renal doctor and pcp   Arthritis    Breast cancer (HCC) 04/2016   Rt. breast ca, f/u with radiation   Cancer  (HCC) 1961   uterine cancer   Coronary artery disease    Dyspnea    GERD (gastroesophageal reflux disease)    Hypertension    Kidney disease    stage 4.  followed by dr. Clyda Hurdle   Lower extremity edema    Personal history of radiation therapy    S/P lumpectomy, right breast    having done on 04/26/16   Sleep apnea    uses cpap   Past Surgical History:  Procedure Laterality Date   ABDOMINAL HYSTERECTOMY     BREAST BIOPSY Right 03/2016   invasive mammary carcinoma   BREAST LUMPECTOMY Right    04/26/16, f/u with radiation   CAROTID STENT Left 2016   was having pain down neck. carotid artery clogged. dr. Gilda Crease put stent in   CAROTID STENT Left    CATARACT EXTRACTION W/ INTRAOCULAR LENS  IMPLANT, BILATERAL Bilateral 2009   ESOPHAGOGASTRODUODENOSCOPY (EGD) WITH PROPOFOL N/A 09/01/2022   Procedure: ESOPHAGOGASTRODUODENOSCOPY (EGD) WITH PROPOFOL;  Surgeon: Toney Reil, MD;  Location: [facility] ENDOSCOPY;  Service: Gastroenterology;  Laterality: N/A;  WHEELCHAIR BOUND - WILL NEED LYFT   EYE SURGERY  2009   cataracts   HIP PINNING,CANNULATED Right 01/04/2020   Procedure: CANNULATED HIP PINNING;  Surgeon: Juanell Fairly, MD;  Location: [facility] ORS;  Service: Orthopedics;  Laterality: Right;   MASTECTOMY     PARTIAL HYSTERECTOMY  1961   uterus only removed  PARTIAL MASTECTOMY WITH NEEDLE LOCALIZATION Right 04/26/2016   Procedure: PARTIAL MASTECTOMY WITH NEEDLE LOCALIZATION;  Surgeon: Nadeen Landau, MD;  Location: [facility] ORS;  Service: General;  Laterality: Right;   SENTINEL NODE BIOPSY Right 04/26/2016   Procedure: SENTINEL NODE BIOPSY;  Surgeon: Nadeen Landau, MD;  Location: [facility] ORS;  Service: General;  Laterality: Right;     A IV Location/Drains/Wounds Patient Lines/Drains/Airways Status     Active Line/Drains/Airways     Name Placement date Placement time Site Days   Peripheral IV 04/13/23 22 G 1" Anterior;Left Forearm 04/13/23  1000  Forearm  less than 1             Intake/Output Last 24 hours No intake or output data in the 24 hours ending 04/13/23 1623  Labs/Imaging Results for orders placed or performed during the hospital encounter of 04/13/23 (from the past 48 hour(s))  Comprehensive metabolic panel     Status: Abnormal   Collection Time: 04/13/23  8:16 AM  Result Value Ref Range   Sodium 140 135 - 145 mmol/L   Potassium 4.7 3.5 - 5.1 mmol/L   Chloride 114 (H) 98 - 111 mmol/L   CO2 13 (L) 22 - 32 mmol/L   Glucose, Bld 98 70 - 99 mg/dL    Comment: Glucose reference range applies only to samples taken after fasting for at least 8 hours.   BUN 41 (H) 8 - 23 mg/dL   Creatinine, Ser 5.62 (H) 0.44 - 1.00 mg/dL   Calcium 9.4 8.9 - 13.0 mg/dL   Total Protein 6.5 6.5 - 8.1 g/dL   Albumin 3.2 (L) 3.5 - 5.0 g/dL   AST 13 (L) 15 - 41 U/L   ALT 9 0 - 44 U/L   Alkaline Phosphatase 91 38 - 126 U/L   Total Bilirubin 0.5 <1.2 mg/dL   GFR, Estimated 26 (L) >60 mL/min    Comment: (NOTE) Calculated using the CKD-EPI Creatinine Equation (2021)    Anion gap 13 5 - 15    Comment: Performed at [facility], [address] [place], [place] [address]  CBC     Status: Abnormal   Collection Time: 04/13/23  8:16 AM  Result Value Ref Range   WBC 7.6 4.0 - 10.5 K/uL   RBC 3.43 (L) 3.87 - 5.11 MIL/uL   Hemoglobin 10.8 (L) 12.0 - 15.0 g/dL   HCT 46.9 (L) 62.9 - 52.8 %   MCV 103.8 (H) 80.0 - 100.0 fL   MCH 31.5 26.0 - 34.0 pg   MCHC 30.3 30.0 - 36.0 g/dL   RDW 41.3 24.4 - 01.0 %   Platelets 287 150 - 400 K/uL   nRBC 0.0 0.0 - 0.2 %    Comment: Performed at [facility], [address] [place], [place] [address]  Protime-INR - (order if patient is taking Coumadin / Warfarin)     Status: Abnormal   Collection Time: 04/13/23  8:16 AM  Result Value Ref Range   Prothrombin Time 16.7 (H) 11.4 - 15.2 seconds   INR 1.3 (H) 0.8 - 1.2    Comment: (NOTE) INR goal varies based on device and disease states. Performed at [facility], [address] [place], [place] [address]   Urinalysis, w/ Reflex to Culture (Infection Suspected) -Urine, Catheterized     Status: Abnormal   Collection Time: 04/13/23  9:40 AM  Result Value Ref Range   Specimen Source URINE, CATHETERIZED    Color, Urine YELLOW (A)  YELLOW   APPearance CLOUDY (A) CLEAR   Specific Gravity, Urine 1.019 1.005 - 1.030   pH 5.0 5.0 - 8.0   Glucose, UA NEGATIVE NEGATIVE mg/dL   Hgb urine dipstick LARGE (A) NEGATIVE   Bilirubin Urine NEGATIVE NEGATIVE   Ketones, ur 5 (A) NEGATIVE mg/dL   Protein, ur 664 (A) NEGATIVE mg/dL   Nitrite NEGATIVE NEGATIVE   Leukocytes,Ua LARGE (A) NEGATIVE   RBC / HPF >50 0 - 5 RBC/hpf   WBC, UA >50 0 - 5 WBC/hpf    Comment:        Reflex urine culture not performed if WBC <=10, OR if Squamous epithelial cells >5. If Squamous epithelial cells >5 suggest recollection.    Bacteria, UA FEW (A) NONE SEEN   Squamous Epithelial / HPF 0-5 0 - 5 /HPF   WBC Clumps PRESENT    Mucus PRESENT    Hyaline Casts, UA PRESENT     Comment: Performed at [facility], [address], [place], [place] [address]  Resp panel by RT-PCR (RSV, Flu A&B, Covid) Anterior Nasal Swab     Status: None   Collection Time: 04/13/23 10:02 AM   Specimen: Anterior Nasal Swab  Result Value Ref Range   SARS Coronavirus 2 by RT PCR NEGATIVE NEGATIVE    Comment: (NOTE) SARS-CoV-2 target nucleic acids are NOT DETECTED.  The SARS-CoV-2 RNA is generally detectable in upper respiratory specimens during the acute phase of infection. The lowest concentration of SARS-CoV-2 viral copies this assay can detect is 138 copies/mL. A negative result does not preclude SARS-Cov-2 infection and should not be used as the sole basis for treatment or other patient management decisions. A negative result may occur with  improper specimen collection/handling, submission of specimen other than nasopharyngeal swab, presence of viral mutation(s) within the areas  targeted by this assay, and inadequate number of viral copies(<138 copies/mL). A negative result must be combined with clinical observations, patient history, and epidemiological information. The expected result is Negative.  Fact Sheet for Patients:  BloggerCourse.com  Fact Sheet for Healthcare Providers:  SeriousBroker.it  This test is no t yet approved or cleared by the [place] FDA and  has been authorized for detection and/or diagnosis of SARS-CoV-2 by FDA under an Emergency Use Authorization (EUA). This EUA will remain  in effect (meaning this test can be used) for the duration of the COVID-19 declaration under Section 564(b)(1) of the Act, 21 U.S.C.section 360bbb-3(b)(1), unless the authorization is terminated  or revoked sooner.       Influenza A by PCR NEGATIVE NEGATIVE   Influenza B by PCR NEGATIVE NEGATIVE    Comment: (NOTE) The Xpert Xpress SARS-CoV-2/FLU/RSV plus assay is intended as an aid in the diagnosis of influenza from Nasopharyngeal swab specimens and should not be used as a sole basis for treatment. Nasal washings and aspirates are unacceptable for Xpert Xpress SARS-CoV-2/FLU/RSV testing.  Fact Sheet for Patients: BloggerCourse.com  Fact Sheet for Healthcare Providers: SeriousBroker.it  This test is not yet approved or cleared by the [place] FDA and has been authorized for detection and/or diagnosis of SARS-CoV-2 by FDA under an Emergency Use Authorization (EUA). This EUA will remain in effect (meaning this test can be used) for the duration of the COVID-19 declaration under Section 564(b)(1) of the Act, 21 U.S.C. section 360bbb-3(b)(1), unless the authorization is terminated or revoked.     Resp Syncytial Virus by PCR NEGATIVE NEGATIVE    Comment: (NOTE) Fact Sheet for Patients: BloggerCourse.com  Fact Sheet for  Healthcare Providers: SeriousBroker.it  This test is not yet approved or cleared by the [place] FDA and has been authorized for detection and/or diagnosis of SARS-CoV-2 by FDA under an Emergency Use Authorization (EUA). This EUA will remain in effect (meaning this test can be used) for the duration of the COVID-19 declaration under Section 564(b)(1) of the Act, 21 U.S.C. section 360bbb-3(b)(1), unless the authorization is terminated or revoked.  Performed at [facility], [address] [place], [place] [address]   Blood gas, venous     Status: Abnormal   Collection Time: 04/13/23  3:47 PM  Result Value Ref Range   pH, Ven 7.25 7.25 - 7.43   pCO2, Ven 39 (L) 44 - 60 mmHg   pO2, Ven 52 (H) 32 - 45 mmHg   Bicarbonate 17.1 (L) 20.0 - 28.0 mmol/L   Acid-base deficit 9.5 (H) 0.0 - 2.0 mmol/L   O2 Saturation 86.7 %   Patient temperature 37.0    Collection site VENOUS     Comment: Performed at [facility], [address] [place], [place] [address]   CT Head Wo Contrast  Result Date: 04/13/2023 CLINICAL DATA:  Mental status change, unknown cause EXAM: CT HEAD WITHOUT CONTRAST TECHNIQUE: Contiguous axial images were obtained from the base of the skull through the vertex without intravenous contrast. RADIATION DOSE REDUCTION: This exam was performed according to the departmental dose-optimization program which includes automated exposure control, adjustment of the mA and/or kV according to patient size and/or use of iterative reconstruction technique. COMPARISON:  CT Head 09/12/20 FINDINGS: Brain: No hemorrhage. No hydrocephalus. No extra-axial fluid collection. No CT evidence of an acute cortical infarct. No mass effect. No mass lesion. There is sequela of moderate microvascular ischemic change. Vascular: No hyperdense vessel or unexpected calcification. Skull: Normal. Negative for fracture or focal lesion. Sinuses/Orbits: No middle ear or  mastoid effusion. Paranasal sinuses clear. Bilateral lens replacement. Orbits are otherwise unremarkable. Other: None. IMPRESSION: 1. No hemorrhage or CT evidence of an acute cortical infarct. 2. Sequela of moderate microvascular ischemic change. Electronically Signed   By: Lorenza Cambridge M.D.   On: 04/13/2023 10:47   DG Chest 1 View  Result Date: 04/13/2023 CLINICAL DATA:  Confusion.  Weakness. EXAM: CHEST  1 VIEW COMPARISON:  Chest radiograph dated July 05, 2021. FINDINGS: Patient is rotated to the right. Large hiatal hernia. Normal heart size. No focal consolidation, sizeable pleural effusion, or pneumothorax. No acute osseous abnormality. IMPRESSION: No acute cardiopulmonary findings.  Large hiatal hernia. Electronically Signed   By: Hart Robinsons M.D.   On: 04/13/2023 10:13    Pending Labs Unresulted Labs (From admission, onward)     Start     Ordered   04/14/23 0500  CBC  Tomorrow morning,   STAT        04/13/23 1140   04/14/23 0500  Basic metabolic panel  Tomorrow morning,   STAT        04/13/23 1140   04/13/23 0940  Urine Culture  Once,   R        04/13/23 0940            Vitals/Pain Today's Vitals   04/13/23 1245 04/13/23 1300 04/13/23 1320 04/13/23 1530  BP:  135/68  (!) 145/76  Pulse: (!) 105 (!) 102  95  Resp: (!) 21 12  16   Temp:   98.4 F (36.9 C)   TempSrc:   Axillary   SpO2: 98% 97%  99%  Weight:      Height:      PainSc:        Isolation Precautions No active isolations  Medications Medications  cefTRIAXone (ROCEPHIN) 1 g in sodium chloride 0.9 % 100 mL IVPB (has no administration in time range)  acetaminophen (TYLENOL) tablet 1,000 mg (has no administration in time range)  pravastatin (PRAVACHOL) tablet 20 mg (has no administration in time range)  metoprolol tartrate (LOPRESSOR) tablet 25 mg (has no administration in time range)  alum & mag hydroxide-simeth (MAALOX/MYLANTA) 200-200-20 MG/5ML suspension 30 mL (has no administration in time range)   famotidine (PEPCID) tablet 20 mg (has no administration in time range)  loperamide (IMODIUM) capsule 4 mg (has no administration in time range)  pantoprazole (PROTONIX) EC tablet 40 mg (has no administration in time range)  Probiotic Advanced CAPS 1 capsule (has no administration in time range)  simethicone (MYLICON) chewable tablet 80 mg (has no administration in time range)  apixaban (ELIQUIS) tablet 2.5 mg (has no administration in time range)  methocarbamol (ROBAXIN) tablet 750 mg (has no administration in time range)  ondansetron (ZOFRAN) tablet 4 mg (has no administration in time range)    Or  ondansetron (ZOFRAN) injection 4 mg (has no administration in time range)  sodium bicarbonate tablet 650 mg (650 mg Oral Given 04/13/23 1436)  sodium chloride 0.9 % bolus 1,000 mL (0 mLs Intravenous Stopped 04/13/23 1331)  ondansetron (ZOFRAN) injection 4 mg (4 mg Intravenous Given 04/13/23 1001)  cefTRIAXone (ROCEPHIN) 1 g in sodium chloride 0.9 % 100 mL IVPB (0 g Intravenous Stopped 04/13/23 1331)    Mobility manual wheelchair     Focused Assessments Neuro Assessment Handoff:  Swallow screen pass? Yes          Neuro Assessment: Exceptions to WDL Neuro Checks:      Has TPA been given? No If patient is a Neuro Trauma and patient is going to OR before floor call report to 4N Charge nurse: [phone] or [phone]   R Recommendations: See Admitting Provider Note  Report given to:   Additional Notes:

## 2023-04-13 NOTE — ED Triage Notes (Signed)
Patient to ED via [facility] from [facility] for altered mental status. Son reports staff attempted to wake up patient and they had a hard time compared to normal. Son reports no disorientation at baseline. Patient alert to self and place at this time. Son reports patient had medication change- recently taking muscle relaxants.

## 2023-04-13 NOTE — H&P (Signed)
History and Physical    Rachel Jones GUY:[id number] DOB: 01-13-1934 DOA: 04/13/2023  PCP: System, Provider Not In (Confirm with patient/family/NH records and if not entered, this has to be entered at [facility] point of entry) Patient coming from: Home  I have personally briefly reviewed patient's old medical records in [facility]  Chief Complaint: I feel fine  HPI: Rachel Jones is a 89 y.o. female with medical history significant of advanced dementia, recurrent UTIs, DVT on prophylactic dosage of Eliquis, CAD, HTN, GERD, CKD stage IV, OSA on CPAP, sent from nursing home for evaluation of sudden onset of confusion.  It was found to be confused this morning compared to her baseline, which she usually is sharp despite as well as dementia.  And nursing home sent her to ED for evaluation for possible UTI as patient has had similar symptoms earlier this year when having a UTI process.  Denied any dysuria, no fever or chills, no back pain.  ED Course: Afebrile, no tachycardia, nonhypotensive nonhypoxic.  UA showed WBC more than 50, RBC more than 50.  Blood work showed creatinine 1.8 compared to her baseline 1.5-1.8, bicarb 13, hemoglobin 10.6, WBC 3.8.  Patient was started on ceftriaxone daily.  Review of Systems: As per HPI otherwise 14 point review of systems negative.    Past Medical History:  Diagnosis Date   Anemia    is followed by renal doctor and pcp   Arthritis    Breast cancer (HCC) 04/2016   Rt. breast ca, f/u with radiation   Cancer (HCC) 1961   uterine cancer   Coronary artery disease    Dyspnea    GERD (gastroesophageal reflux disease)    Hypertension    Kidney disease    stage 4.  followed by dr. Clyda Hurdle   Lower extremity edema    Personal history of radiation therapy    S/P lumpectomy, right breast    having done on 04/26/16   Sleep apnea    uses cpap    Past Surgical History:  Procedure Laterality Date   ABDOMINAL HYSTERECTOMY     BREAST BIOPSY Right  03/2016   invasive mammary carcinoma   BREAST LUMPECTOMY Right    04/26/16, f/u with radiation   CAROTID STENT Left 2016   was having pain down neck. carotid artery clogged. dr. Gilda Crease put stent in   CAROTID STENT Left    CATARACT EXTRACTION W/ INTRAOCULAR LENS  IMPLANT, BILATERAL Bilateral 2009   ESOPHAGOGASTRODUODENOSCOPY (EGD) WITH PROPOFOL N/A 09/01/2022   Procedure: ESOPHAGOGASTRODUODENOSCOPY (EGD) WITH PROPOFOL;  Surgeon: Toney Reil, MD;  Location: [facility] ENDOSCOPY;  Service: Gastroenterology;  Laterality: N/A;  WHEELCHAIR BOUND - WILL NEED LYFT   EYE SURGERY  2009   cataracts   HIP PINNING,CANNULATED Right 01/04/2020   Procedure: CANNULATED HIP PINNING;  Surgeon: Juanell Fairly, MD;  Location: [facility] ORS;  Service: Orthopedics;  Laterality: Right;   MASTECTOMY     PARTIAL HYSTERECTOMY  1961   uterus only removed   PARTIAL MASTECTOMY WITH NEEDLE LOCALIZATION Right 04/26/2016   Procedure: PARTIAL MASTECTOMY WITH NEEDLE LOCALIZATION;  Surgeon: Nadeen Landau, MD;  Location: [facility] ORS;  Service: General;  Laterality: Right;   SENTINEL NODE BIOPSY Right 04/26/2016   Procedure: SENTINEL NODE BIOPSY;  Surgeon: Nadeen Landau, MD;  Location: [facility] ORS;  Service: General;  Laterality: Right;     reports that she has never smoked. She has never used smokeless tobacco. She reports that she does not drink alcohol  and does not use drugs.  Allergies  Allergen Reactions   Cefuroxime Axetil Diarrhea   Allopurinol    Codeine Nausea Only    Intolerance instead of true allerg   Sulfa Antibiotics Swelling and Rash    swelling    Family History  Problem Relation Age of Onset   Bladder Cancer Neg Hx    Kidney cancer Neg Hx    Prolactinoma Neg Hx    Prostate cancer Neg Hx      Prior to Admission medications   Medication Sig Start Date End Date Taking? Authorizing Provider  acetaminophen (TYLENOL) 500 MG tablet Take 1,000 mg by mouth daily. May take an additional 1000 mgs  as needed for pain    [provider]  alum & mag hydroxide-simeth (MAALOX/MYLANTA) 200-200-20 MG/5ML suspension Take 30 mLs by mouth every 4 (four) hours as needed for indigestion. 01/07/20   Sreenath, Jonelle Sports, MD  apixaban (ELIQUIS) 2.5 MG TABS tablet Take 1 tablet by mouth 2 (two) times a day. 12/09/18   [provider]  Cholecalciferol (VITAMIN D3) 1000 units CAPS Take 1,000 Units by mouth daily.     [provider]  famotidine (PEPCID) 20 MG tablet Take 20 mg by mouth 2 (two) times daily.    [provider]  ferrous gluconate (FERGON) 324 MG tablet Take 324 mg by mouth daily.    [provider]  furosemide (LASIX) 20 MG tablet Take 20 mg by mouth as needed. 03/05/18   [provider]  loperamide (IMODIUM) 2 MG capsule Take 4 mg by mouth as needed. 01/30/21   [provider]  lovastatin (MEVACOR) 20 MG tablet Take 20 mg by mouth daily. 07/26/22   [provider]  Magnesium 200 MG TABS Take 200 mg by mouth in the morning, at noon, and at bedtime.    [provider]  methocarbamol (ROBAXIN) 750 MG tablet Take 1 tablet (750 mg total) by mouth 3 (three) times daily. 01/07/20   Tresa Moore, MD  metoprolol tartrate (LOPRESSOR) 25 MG tablet Take 25 mg by mouth 2 (two) times daily. 11/20/19   [provider]  Multiple Vitamin (MULTIVITAMIN) capsule Take 1 capsule by mouth daily.    [provider]  nitroGLYCERIN (NITROSTAT) 0.4 MG SL tablet Place 0.4 mg under the tongue every 5 (five) minutes as needed.    [provider]  omeprazole (PRILOSEC) 20 MG capsule Take 1 capsule (20 mg total) by mouth 2 (two) times daily before a meal. 09/01/22 08/27/23  Vanga, Loel Dubonnet, MD  Probiotic Product (PROBIOTIC ADVANCED) CAPS Take 1 capsule by mouth daily.     [provider]  simethicone (MYLICON) 125 MG chewable tablet Chew 125 mg by mouth every 6 (six) hours as needed. 01/07/20   [provider]  zinc gluconate 50 MG tablet Take 50 mg by mouth daily.    [provider]    Physical Exam: Vitals:   04/13/23 0809 04/13/23 0810 04/13/23 1000  BP: (!) 151/77  (!) 153/72  Pulse: (!) 116  96  Resp: 18  18  Temp: 97.6 F (36.4 C)    TempSrc: Oral    SpO2: 96%  100%  Weight:  70.3 kg   Height:  5\' 3"  (1.6 m)     Constitutional: NAD, calm, comfortable Vitals:   04/13/23 0809 04/13/23 0810 04/13/23 1000  BP: (!) 151/77  (!) 153/72  Pulse: (!) 116  96  Resp: 18  18  Temp:  97.6 F (36.4 C)    TempSrc: Oral    SpO2: 96%  100%  Weight:  70.3 kg   Height:  5\' 3"  (1.6 m)    Eyes: PERRL, lids and conjunctivae normal ENMT: Mucous membranes are moist. Posterior pharynx clear of any exudate or lesions.Normal dentition.  Neck: normal, supple, no masses, no thyromegaly Respiratory: clear to auscultation bilaterally, no wheezing, no crackles. Normal respiratory effort. No accessory muscle use.  Cardiovascular: Regular rate and rhythm, no murmurs / rubs / gallops. No extremity edema. 2+ pedal pulses. No carotid bruits.  Abdomen: no tenderness, no masses palpated. No hepatosplenomegaly. Bowel sounds positive.  Musculoskeletal: no clubbing / cyanosis. No joint deformity upper and lower extremities. Good ROM, no contractures. Normal muscle tone.  Skin: no rashes, lesions, ulcers. No induration Neurologic: CN 2-12 grossly intact. Sensation intact, DTR normal. Strength 5/5 in all 4.  Psychiatric: Normal judgment and insight. Alert and oriented x 3. Normal mood.     Labs on Admission: I have personally reviewed following labs and imaging studies  CBC: Recent Labs  Lab 04/13/23 0816  WBC 7.6  HGB 10.8*  HCT 35.6*  MCV 103.8*  PLT 287   Basic Metabolic Panel: Recent Labs  Lab 04/13/23 0816  NA 140  K 4.7  CL 114*  CO2 13*  GLUCOSE 98  BUN 41*  CREATININE 1.88*  CALCIUM 9.4   GFR: Estimated Creatinine Clearance: 19.8 mL/min (A) (by C-G formula  based on SCr of 1.88 mg/dL (H)). Liver Function Tests: Recent Labs  Lab 04/13/23 0816  AST 13*  ALT 9  ALKPHOS 91  BILITOT 0.5  PROT 6.5  ALBUMIN 3.2*   No results for input(s): "LIPASE", "AMYLASE" in the last 168 hours. No results for input(s): "AMMONIA" in the last 168 hours. Coagulation Profile: Recent Labs  Lab 04/13/23 0816  INR 1.3*   Cardiac Enzymes: No results for input(s): "CKTOTAL", "CKMB", "CKMBINDEX", "TROPONINI" in the last 168 hours. BNP (last 3 results) No results for input(s): "PROBNP" in the last 8760 hours. HbA1C: No results for input(s): "HGBA1C" in the last 72 hours. CBG: No results for input(s): "GLUCAP" in the last 168 hours. Lipid Profile: No results for input(s): "CHOL", "HDL", "LDLCALC", "TRIG", "CHOLHDL", "LDLDIRECT" in the last 72 hours. Thyroid Function Tests: No results for input(s): "TSH", "T4TOTAL", "FREET4", "T3FREE", "THYROIDAB" in the last 72 hours. Anemia Panel: No results for input(s): "VITAMINB12", "FOLATE", "FERRITIN", "TIBC", "IRON", "RETICCTPCT" in the last 72 hours. Urine analysis:    Component Value Date/Time   COLORURINE YELLOW (A) 04/13/2023 0940   APPEARANCEUR CLOUDY (A) 04/13/2023 0940   APPEARANCEUR Clear 12/19/2016 1404   LABSPEC 1.019 04/13/2023 0940   PHURINE 5.0 04/13/2023 0940   GLUCOSEU NEGATIVE 04/13/2023 0940   HGBUR LARGE (A) 04/13/2023 0940   BILIRUBINUR NEGATIVE 04/13/2023 0940   BILIRUBINUR Negative 12/19/2016 1404   KETONESUR 5 (A) 04/13/2023 0940   PROTEINUR 100 (A) 04/13/2023 0940   NITRITE NEGATIVE 04/13/2023 0940   LEUKOCYTESUR LARGE (A) 04/13/2023 0940    Radiological Exams on Admission: CT Head Wo Contrast  Result Date: 04/13/2023 CLINICAL DATA:  Mental status change, unknown cause EXAM: CT HEAD WITHOUT CONTRAST TECHNIQUE: Contiguous axial images were obtained from the base of the skull through the vertex without intravenous contrast. RADIATION DOSE REDUCTION: This exam was performed according to  the departmental dose-optimization program which includes automated exposure control, adjustment of the mA and/or kV according to patient size and/or use of iterative reconstruction technique. COMPARISON:  CT Head  09/12/20 FINDINGS: Brain: No hemorrhage. No hydrocephalus. No extra-axial fluid collection. No CT evidence of an acute cortical infarct. No mass effect. No mass lesion. There is sequela of moderate microvascular ischemic change. Vascular: No hyperdense vessel or unexpected calcification. Skull: Normal. Negative for fracture or focal lesion. Sinuses/Orbits: No middle ear or mastoid effusion. Paranasal sinuses clear. Bilateral lens replacement. Orbits are otherwise unremarkable. Other: None. IMPRESSION: 1. No hemorrhage or CT evidence of an acute cortical infarct. 2. Sequela of moderate microvascular ischemic change. Electronically Signed   By: Lorenza Cambridge M.D.   On: 04/13/2023 10:47   DG Chest 1 View  Result Date: 04/13/2023 CLINICAL DATA:  Confusion.  Weakness. EXAM: CHEST  1 VIEW COMPARISON:  Chest radiograph dated July 05, 2021. FINDINGS: Patient is rotated to the right. Large hiatal hernia. Normal heart size. No focal consolidation, sizeable pleural effusion, or pneumothorax. No acute osseous abnormality. IMPRESSION: No acute cardiopulmonary findings.  Large hiatal hernia. Electronically Signed   By: Hart Robinsons M.D.   On: 04/13/2023 10:13    EKG: Pending  Assessment/Plan Principal Problem:   Encephalopathy Active Problems:   Acute metabolic encephalopathy   UTI (urinary tract infection)  (please populate well all problems here in Problem List. (For example, if patient is on BP meds at home and you resume or decide to hold them, it is a problem that needs to be her. Same for CAD, COPD, HLD and so on)  Acute metabolic encephalopathy -Improving -Secondary to UTI -Denied any fall at nursing home, mentation significantly improved in the ED, low suspicion for intracranial  etiology, hold off CT head.  Complicated UTI with microscopic hematuria -Continue ceftriaxone while waiting for urine culture result  Hx of DVT -No acute concern, continue Eliquis  CKD stage IV -Euvolemic, creatinine level stable compared to her baseline. -Start bicarb supplements  Chronic normocytic anemia -H&H stable, no symptoms signs of active bleeding  OSA -CPAP HS  Advanced dementia -According to family, her mentation is back to her baseline.  DVT prophylaxis: Eliquis Code Status: DNR Family Communication: Son at bedside Disposition Plan: Expect less than 2 midnight hospital stay Consults called: None Admission status: Medsurg obs   Emeline General MD [facility] Pager [phone]  04/13/2023, 11:55 AM

## 2023-04-13 NOTE — ED Triage Notes (Signed)
First Nurse Note;  Pt via [facility] from [facility]. Staff report AMS since 1000 yesterday and loss of appetite. Pt has had recent changes to her muscle relaxants. Does take Eliquis.Pt is A&Ox3 and more confused but NAD, no hx of dementia.   164/72 BP  98% on RA 91 HR  98 CBG  97.8 oral SR on 12-lead per EMS

## 2023-04-13 NOTE — ED Notes (Signed)
Resumed care from billy rn.  Pt alert  family with pt.  Pt waiting on bed assignment.

## 2023-04-14 DIAGNOSIS — G9341 Metabolic encephalopathy: Secondary | ICD-10-CM | POA: Diagnosis not present

## 2023-04-14 DIAGNOSIS — F0390 Unspecified dementia without behavioral disturbance: Secondary | ICD-10-CM | POA: Diagnosis present

## 2023-04-14 LAB — BASIC METABOLIC PANEL
Anion gap: 8 (ref 5–15)
BUN: 40 mg/dL — ABNORMAL HIGH (ref 8–23)
CO2: 17 mmol/L — ABNORMAL LOW (ref 22–32)
Calcium: 8.7 mg/dL — ABNORMAL LOW (ref 8.9–10.3)
Chloride: 112 mmol/L — ABNORMAL HIGH (ref 98–111)
Creatinine, Ser: 1.68 mg/dL — ABNORMAL HIGH (ref 0.44–1.00)
GFR, Estimated: 29 mL/min — ABNORMAL LOW (ref >60)
Glucose, Bld: 88 mg/dL (ref 70–99)
Potassium: 4.6 mmol/L (ref 3.5–5.1)
Sodium: 137 mmol/L (ref 135–145)

## 2023-04-14 LAB — CBC
HCT: 30.1 % — ABNORMAL LOW (ref 36.0–46.0)
Hemoglobin: 9.4 g/dL — ABNORMAL LOW (ref 12.0–15.0)
MCH: 31.3 pg (ref 26.0–34.0)
MCHC: 31.2 g/dL (ref 30.0–36.0)
MCV: 100.3 fL — ABNORMAL HIGH (ref 80.0–100.0)
Platelets: 252 10*3/uL (ref 150–400)
RBC: 3 MIL/uL — ABNORMAL LOW (ref 3.87–5.11)
RDW: 13.8 % (ref 11.5–15.5)
WBC: 6.7 10*3/uL (ref 4.0–10.5)
nRBC: 0 % (ref 0.0–0.2)

## 2023-04-14 MED ORDER — BACLOFEN 10 MG PO TABS
5.0000 mg | ORAL_TABLET | Freq: Two times a day (BID) | ORAL | Status: DC
  Administered 2023-04-14 – 2023-04-15 (×3): 5 mg via ORAL
  Filled 2023-04-14 (×3): qty 1

## 2023-04-14 MED ORDER — POLYETHYLENE GLYCOL 3350 17 G PO PACK
17.0000 g | PACK | Freq: Every day | ORAL | Status: DC | PRN
  Administered 2023-04-14: 17 g via ORAL
  Filled 2023-04-14: qty 1

## 2023-04-14 MED ORDER — SENNOSIDES-DOCUSATE SODIUM 8.6-50 MG PO TABS
1.0000 | ORAL_TABLET | Freq: Every day | ORAL | Status: DC
  Administered 2023-04-14 – 2023-04-16 (×3): 1 via ORAL
  Filled 2023-04-14 (×3): qty 1

## 2023-04-14 NOTE — Plan of Care (Signed)
  Problem: Education: Goal: Knowledge of General Education information will improve Description: Including pain rating scale, medication(s)/side effects and non-pharmacologic comfort measures Outcome: Progressing   Problem: Clinical Measurements: Goal: Ability to maintain clinical measurements within normal limits will improve Outcome: Progressing   Problem: Activity: Goal: Risk for activity intolerance will decrease Outcome: Progressing   Problem: Nutrition: Goal: Adequate nutrition will be maintained Outcome: Progressing   Problem: Coping: Goal: Level of anxiety will decrease Outcome: Progressing   Problem: Pain Management: Goal: General experience of comfort will improve Outcome: Progressing   Problem: Safety: Goal: Ability to remain free from injury will improve Outcome: Progressing   Problem: Skin Integrity: Goal: Risk for impaired skin integrity will decrease Outcome: Progressing

## 2023-04-14 NOTE — Care Management Obs Status (Signed)
MEDICARE OBSERVATION STATUS NOTIFICATION   Patient Details  Name: Rachel Jones MRN: [id number] Date of Birth: 1933-11-10   Medicare Observation Status Notification Given:  Yes    Kaidan Harpster, LCSW 04/14/2023, 3:17 PM

## 2023-04-14 NOTE — Progress Notes (Addendum)
Progress Note    Rachel Jones  VHQ:[id number] DOB: March 09, 1934  DOA: 04/13/2023 PCP: System, Provider Not In      Brief Narrative:    Medical records reviewed and are as summarized below:  Rachel Jones is a 89 y.o. female  with medical history significant for dementia, recurrent UTIs, DVT on prophylactic dosage of Eliquis, CAD, HTN, GERD, CKD stage IV, OSA on CPAP, leg cramps recently started on baclofen, chronic back pain, who was brought from the nursing home because of increasing confusion.      Assessment/Plan:   Principal Problem:   Acute metabolic encephalopathy Active Problems:   CKD (chronic kidney disease), stage IV (HCC)   UTI (urinary tract infection)   Dementia without behavioral disturbance (HCC)    Body mass index is 27.46 kg/m.   Acute UTI in a patient with recurrent UTIs: Continue IV ceftriaxone.  Follow-up urine cultures.   Acute metabolic encephalopathy on underlying dementia: Mental status has improved to baseline.   CKD stage IV: Creatinine is stable   Leg cramps: She says she was recently prescribed baclofen and she wants to start this in the hospital.  Arline Asp, daughter-in-law, confirmed this.   History of DVT on Eliquis   Constipation: Laxatives as needed.   Comorbidities include CAD, chronic low back pain, chronic bilateral hip pain, hypertension, GERD  Diet Order             Diet Heart Room service appropriate? Yes; Fluid consistency: Thin  Diet effective now                            Consultants: None  Procedures: None    Medications:    acidophilus  1 capsule Oral Daily   apixaban  2.5 mg Oral BID   baclofen  5 mg Oral BID   famotidine  20 mg Oral QHS   metoprolol tartrate  25 mg Oral BID   pantoprazole  40 mg Oral Daily   pravastatin  20 mg Oral q1800   sodium bicarbonate  650 mg Oral Daily   Continuous Infusions:  cefTRIAXone (ROCEPHIN)  IV 1 g (04/14/23 1037)     Anti-infectives  (From admission, onward)    Start     Dose/Rate Route Frequency Ordered Stop   04/14/23 1000  cefTRIAXone (ROCEPHIN) 1 g in sodium chloride 0.9 % 100 mL IVPB        1 g 200 mL/hr over 30 Minutes Intravenous Every 24 hours 04/13/23 1137     04/13/23 1115  cefTRIAXone (ROCEPHIN) 1 g in sodium chloride 0.9 % 100 mL IVPB        1 g 200 mL/hr over 30 Minutes Intravenous STAT 04/13/23 1104 04/13/23 1331              Family Communication/Anticipated D/C date and plan/Code Status   DVT prophylaxis: apixaban (ELIQUIS) tablet 2.5 mg Start: 04/13/23 2200 apixaban (ELIQUIS) tablet 2.5 mg     Code Status: Limited: Do not attempt resuscitation (DNR) -DNR-LIMITED -Do Not Intubate/DNI   Family Communication: Plan discussed with Cindy, daughter-in-law, at the bedside Disposition Plan: Plan to discharge to SNF   Status is: Observation The patient will require care spanning > 2 midnights and should be moved to inpatient because: Acute UTI on IV antibiotics awaiting culture results       Subjective:   Interval events noted.  She complains of leg cramps, back pain, bilateral hip pain  and "stomach pain".  She said pain from these sites is not new.  Cindy, daughter-in-law, was at the bedside.  Objective:    Vitals:   04/13/23 1957 04/14/23 0344 04/14/23 0733 04/14/23 0953  BP: 135/66 (!) 139/56 134/64 (!) 138/94  Pulse: (!) 107 81 90 84  Resp:  18 18 18   Temp: 98.1 F (36.7 C) 98 F (36.7 C) 97.9 F (36.6 C) 98.1 F (36.7 C)  TempSrc:    Oral  SpO2: 100% 95% 97% 97%  Weight:      Height:       No data found.   Intake/Output Summary (Last 24 hours) at 04/14/2023 1219 Last data filed at 04/14/2023 1044 Gross per 24 hour  Intake 240 ml  Output --  Net 240 ml   Filed Weights   04/13/23 0810  Weight: 70.3 kg    Exam:  GEN: NAD SKIN: Warm and dry EYES: No pallor or icterus ENT: MMM CV: RRR PULM: CTA B ABD: soft, ND, NT, +BS CNS: AAO x 2 (person and place), non  focal EXT: No edema or tenderness        Data Reviewed:   I have personally reviewed following labs and imaging studies:  Labs: Labs show the following:   Basic Metabolic Panel: Recent Labs  Lab 04/13/23 0816 04/14/23 0417  NA 140 137  K 4.7 4.6  CL 114* 112*  CO2 13* 17*  GLUCOSE 98 88  BUN 41* 40*  CREATININE 1.88* 1.68*  CALCIUM 9.4 8.7*   GFR Estimated Creatinine Clearance: 22.2 mL/min (A) (by C-G formula based on SCr of 1.68 mg/dL (H)). Liver Function Tests: Recent Labs  Lab 04/13/23 0816  AST 13*  ALT 9  ALKPHOS 91  BILITOT 0.5  PROT 6.5  ALBUMIN 3.2*   No results for input(s): "LIPASE", "AMYLASE" in the last 168 hours. No results for input(s): "AMMONIA" in the last 168 hours. Coagulation profile Recent Labs  Lab 04/13/23 0816  INR 1.3*    CBC: Recent Labs  Lab 04/13/23 0816 04/14/23 0417  WBC 7.6 6.7  HGB 10.8* 9.4*  HCT 35.6* 30.1*  MCV 103.8* 100.3*  PLT 287 252   Cardiac Enzymes: No results for input(s): "CKTOTAL", "CKMB", "CKMBINDEX", "TROPONINI" in the last 168 hours. BNP (last 3 results) No results for input(s): "PROBNP" in the last 8760 hours. CBG: No results for input(s): "GLUCAP" in the last 168 hours. D-Dimer: No results for input(s): "DDIMER" in the last 72 hours. Hgb A1c: No results for input(s): "HGBA1C" in the last 72 hours. Lipid Profile: No results for input(s): "CHOL", "HDL", "LDLCALC", "TRIG", "CHOLHDL", "LDLDIRECT" in the last 72 hours. Thyroid function studies: No results for input(s): "TSH", "T4TOTAL", "T3FREE", "THYROIDAB" in the last 72 hours.  Invalid input(s): "FREET3" Anemia work up: No results for input(s): "VITAMINB12", "FOLATE", "FERRITIN", "TIBC", "IRON", "RETICCTPCT" in the last 72 hours. Sepsis Labs: Recent Labs  Lab 04/13/23 0816 04/14/23 0417  WBC 7.6 6.7    Microbiology Recent Results (from the past 240 hour(s))  Resp panel by RT-PCR (RSV, Flu A&B, Covid) Anterior Nasal Swab     Status:  None   Collection Time: 04/13/23 10:02 AM   Specimen: Anterior Nasal Swab  Result Value Ref Range Status   SARS Coronavirus 2 by RT PCR NEGATIVE NEGATIVE Final    Comment: (NOTE) SARS-CoV-2 target nucleic acids are NOT DETECTED.  The SARS-CoV-2 RNA is generally detectable in upper respiratory specimens during the acute phase of infection. The lowest concentration of  SARS-CoV-2 viral copies this assay can detect is 138 copies/mL. A negative result does not preclude SARS-Cov-2 infection and should not be used as the sole basis for treatment or other patient management decisions. A negative result may occur with  improper specimen collection/handling, submission of specimen other than nasopharyngeal swab, presence of viral mutation(s) within the areas targeted by this assay, and inadequate number of viral copies(<138 copies/mL). A negative result must be combined with clinical observations, patient history, and epidemiological information. The expected result is Negative.  Fact Sheet for Patients:  BloggerCourse.com  Fact Sheet for Healthcare Providers:  SeriousBroker.it  This test is no t yet approved or cleared by the [place] FDA and  has been authorized for detection and/or diagnosis of SARS-CoV-2 by FDA under an Emergency Use Authorization (EUA). This EUA will remain  in effect (meaning this test can be used) for the duration of the COVID-19 declaration under Section 564(b)(1) of the Act, 21 U.S.C.section 360bbb-3(b)(1), unless the authorization is terminated  or revoked sooner.       Influenza A by PCR NEGATIVE NEGATIVE Final   Influenza B by PCR NEGATIVE NEGATIVE Final    Comment: (NOTE) The Xpert Xpress SARS-CoV-2/FLU/RSV plus assay is intended as an aid in the diagnosis of influenza from Nasopharyngeal swab specimens and should not be used as a sole basis for treatment. Nasal washings and aspirates are unacceptable  for Xpert Xpress SARS-CoV-2/FLU/RSV testing.  Fact Sheet for Patients: BloggerCourse.com  Fact Sheet for Healthcare Providers: SeriousBroker.it  This test is not yet approved or cleared by the [place] FDA and has been authorized for detection and/or diagnosis of SARS-CoV-2 by FDA under an Emergency Use Authorization (EUA). This EUA will remain in effect (meaning this test can be used) for the duration of the COVID-19 declaration under Section 564(b)(1) of the Act, 21 U.S.C. section 360bbb-3(b)(1), unless the authorization is terminated or revoked.     Resp Syncytial Virus by PCR NEGATIVE NEGATIVE Final    Comment: (NOTE) Fact Sheet for Patients: BloggerCourse.com  Fact Sheet for Healthcare Providers: SeriousBroker.it  This test is not yet approved or cleared by the [place] FDA and has been authorized for detection and/or diagnosis of SARS-CoV-2 by FDA under an Emergency Use Authorization (EUA). This EUA will remain in effect (meaning this test can be used) for the duration of the COVID-19 declaration under Section 564(b)(1) of the Act, 21 U.S.C. section 360bbb-3(b)(1), unless the authorization is terminated or revoked.  Performed at [facility], [address] [place], [place] [address]     Procedures and diagnostic studies:  CT Head Wo Contrast  Result Date: 04/13/2023 CLINICAL DATA:  Mental status change, unknown cause EXAM: CT HEAD WITHOUT CONTRAST TECHNIQUE: Contiguous axial images were obtained from the base of the skull through the vertex without intravenous contrast. RADIATION DOSE REDUCTION: This exam was performed according to the departmental dose-optimization program which includes automated exposure control, adjustment of the mA and/or kV according to patient size and/or use of iterative reconstruction technique. COMPARISON:  CT Head 09/12/20  FINDINGS: Brain: No hemorrhage. No hydrocephalus. No extra-axial fluid collection. No CT evidence of an acute cortical infarct. No mass effect. No mass lesion. There is sequela of moderate microvascular ischemic change. Vascular: No hyperdense vessel or unexpected calcification. Skull: Normal. Negative for fracture or focal lesion. Sinuses/Orbits: No middle ear or mastoid effusion. Paranasal sinuses clear. Bilateral lens replacement. Orbits are otherwise unremarkable. Other: None. IMPRESSION: 1. No hemorrhage or CT evidence of an acute  cortical infarct. 2. Sequela of moderate microvascular ischemic change. Electronically Signed   By: Lorenza Cambridge M.D.   On: 04/13/2023 10:47   DG Chest 1 View  Result Date: 04/13/2023 CLINICAL DATA:  Confusion.  Weakness. EXAM: CHEST  1 VIEW COMPARISON:  Chest radiograph dated July 05, 2021. FINDINGS: Patient is rotated to the right. Large hiatal hernia. Normal heart size. No focal consolidation, sizeable pleural effusion, or pneumothorax. No acute osseous abnormality. IMPRESSION: No acute cardiopulmonary findings.  Large hiatal hernia. Electronically Signed   By: Hart Robinsons M.D.   On: 04/13/2023 10:13               LOS: 0 days   Munachimso Palin  [facility]   Pager on [email or web address]. If 7PM-7AM, please contact night-coverage at [email or web address]     04/14/2023, 12:19 PM

## 2023-04-14 NOTE — Plan of Care (Signed)
Problem: Education: Goal: Knowledge of General Education information will improve Description: Including pain rating scale, medication(s)/side effects and non-pharmacologic comfort measures Outcome: Progressing   Problem: Health Behavior/Discharge Planning: Goal: Ability to manage health-related needs will improve Outcome: Progressing   Problem: Clinical Measurements: Goal: Ability to maintain clinical measurements within normal limits will improve Outcome: Progressing   Problem: Activity: Goal: Risk for activity intolerance will decrease Outcome: Progressing   Problem: Nutrition: Goal: Adequate nutrition will be maintained Outcome: Progressing   Problem: Coping: Goal: Level of anxiety will decrease Outcome: Progressing   Problem: Pain Management: Goal: General experience of comfort will improve Outcome: Progressing   Problem: Safety: Goal: Ability to remain free from injury will improve Outcome: Progressing   Problem: Skin Integrity: Goal: Risk for impaired skin integrity will decrease Outcome: Progressing

## 2023-04-14 NOTE — TOC Initial Note (Signed)
Transition of Care [facility]) - Initial/Assessment Note    Patient Details  Name: Rachel Jones MRN: [id number] Date of Birth: [birth year]  [facility] CM/SW Contact:    Allena Katz, LCSW Phone Number: 04/14/2023, 11:45 AM  Clinical Narrative:    Pt admitted with encephalopathy from [facility] LTC. No needs as of current TOC will continue to follow.            Expected Discharge Plan: Skilled Nursing Facility Barriers to Discharge: Continued Medical Work up   Patient Goals and CMS Choice Patient states their goals for this hospitalization and ongoing recovery are:: go back to [facility]          Expected Discharge Plan and Services     Post Acute Care Choice: Skilled Nursing Facility Living arrangements for the past 2 months: Skilled Nursing Facility                                      Prior Living Arrangements/Services Living arrangements for the past 2 months: Skilled Nursing Facility Lives with:: Facility Resident   Do you feel safe going back to the place where you live?: Yes               Activities of Daily Living   ADL Screening (condition at time of admission) Independently performs ADLs?: No Does the patient have a NEW difficulty with bathing/dressing/toileting/self-feeding that is expected to last >3 days?: No Does the patient have a NEW difficulty with getting in/out of bed, walking, or climbing stairs that is expected to last >3 days?: No Does the patient have a NEW difficulty with communication that is expected to last >3 days?: No Is the patient deaf or have difficulty hearing?: No Does the patient have difficulty seeing, even when wearing glasses/contacts?: No Does the patient have difficulty concentrating, remembering, or making decisions?: No  Permission Sought/Granted                  Emotional Assessment       Orientation: : Fluctuating Orientation (Suspected and/or reported Sundowners)      Admission  diagnosis:  Confusion [R41.0] Encephalopathy [G93.40] Cystitis [N30.90] Stage 4 chronic kidney disease (HCC) [N18.4] Patient Active Problem List   Diagnosis Date Noted   UTI (urinary tract infection) 04/13/2023   Encephalopathy 04/13/2023   Esophageal dysphagia 09/01/2022   Hiatal hernia without gangrene or obstruction 09/01/2022   Personal history of other malignant neoplasm of skin 04/28/2022   Hypercalcemia 09/12/2020   Iron deficiency anemia 09/12/2020   Acute metabolic encephalopathy 09/12/2020   CAD (coronary artery disease) 09/12/2020   Hypomagnesemia 09/12/2020   Osteoarthritis of right hip 03/22/2020   Closed fracture of intracapsular section of femur (HCC) 03/22/2020   Resides in skilled nursing facility 03/19/2020   Age-related osteoporosis with current pathological fracture with routine healing 01/08/2020   Closed right hip fracture (HCC) 01/02/2020   Chronic anticoagulation    Benign hypertensive kidney disease with chronic kidney disease 05/06/2019   Proteinuria 05/06/2019   Secondary hyperparathyroidism of renal origin (HCC) 05/06/2019   Chronic deep vein thrombosis (DVT) of proximal vein of lower extremity (HCC) 02/06/2019   History of DVT (deep vein thrombosis) 02/06/2019   B12 deficiency 10/23/2017   Hydronephrosis 11/18/2016   Gout 11/18/2016   Cervical cancer (HCC) 11/18/2016   Carotid stenosis 04/04/2016   Primary cancer of upper inner quadrant of right female breast (  HCC) 04/04/2016   Lymphedema 04/04/2016   Chronic venous insufficiency 04/04/2016   Chest tightness 02/15/2016   Obstructive sleep apnea syndrome 08/13/2015   SOB (shortness of breath) 12/18/2014   Chronic fatigue 07/09/2014   CKD (chronic kidney disease), stage IV (HCC) 05/09/2014   Atypical chest pain 05/09/2014   Chronic tension-type headache, intractable 03/06/2014   Headache 02/06/2014   Facial numbness 02/06/2014   Rheumatoid arthritis involving multiple sites with positive  rheumatoid factor (HCC) 11/01/2013   Peripheral edema 11/01/2013   Post herpetic neuralgia 11/01/2013   Hyperlipidemia 11/01/2013   HTN (hypertension) 11/01/2013   History of recurrent UTIs 11/01/2013   H/O: hysterectomy 11/01/2013   GERD (gastroesophageal reflux disease) 11/01/2013   Chronic headache 11/01/2013   Anemia 11/01/2013   PCP:  System, Provider Not In Pharmacy:   [id number] [place], [place] - [address] AT [facility] [address] [place] [address] [place] [address] Phone: [phone] Fax: [phone]     Social Determinants of Health (SDOH) Social History: SDOH Screenings   Food Insecurity: No Food Insecurity (04/13/2023)  Housing: Low Risk  (04/13/2023)  Transportation Needs: No Transportation Needs (04/13/2023)  Utilities: Not At Risk (04/13/2023)  Tobacco Use: Low Risk  (04/13/2023)   SDOH Interventions:     Readmission Risk Interventions     No data to display

## 2023-04-15 ENCOUNTER — Observation Stay: Payer: Medicare (Managed Care)

## 2023-04-15 DIAGNOSIS — G9341 Metabolic encephalopathy: Secondary | ICD-10-CM | POA: Diagnosis not present

## 2023-04-15 DIAGNOSIS — N3001 Acute cystitis with hematuria: Secondary | ICD-10-CM

## 2023-04-15 LAB — URINE CULTURE: Culture: 50000 — AB

## 2023-04-15 LAB — GLUCOSE, CAPILLARY: Glucose-Capillary: 103 mg/dL — ABNORMAL HIGH (ref 70–99)

## 2023-04-15 MED ORDER — LACTATED RINGERS IV SOLN: INTRAVENOUS | Status: AC

## 2023-04-15 MED ORDER — AMOXICILLIN 250 MG PO CAPS
250.0000 mg | ORAL_CAPSULE | Freq: Two times a day (BID) | ORAL | Status: DC
  Administered 2023-04-16 – 2023-04-17 (×2): 250 mg via ORAL
  Filled 2023-04-15 (×3): qty 1

## 2023-04-15 NOTE — Progress Notes (Addendum)
Progress Note    Rachel Jones  ION:[id number] DOB: 1933-09-23  DOA: 04/13/2023 PCP: System, Provider Not In      Brief Narrative:    Medical records reviewed and are as summarized below:  Rachel Jones is a 89 y.o. female  with medical history significant for dementia, recurrent UTIs, DVT on prophylactic dosage of Eliquis, CAD, HTN, GERD, CKD stage IV, OSA on CPAP, leg cramps recently started on baclofen, chronic back pain, who was brought from the nursing home because of increasing confusion.      Assessment/Plan:   Principal Problem:   Acute metabolic encephalopathy Active Problems:   CKD (chronic kidney disease), stage IV (HCC)   UTI (urinary tract infection)   Dementia without behavioral disturbance (HCC)    Body mass index is 27.46 kg/m.   Acute UTI in a patient with recurrent UTIs: Urine culture showed Proteus mirabilis.  Discontinue IV ceftriaxone and start amoxicillin.    Acute toxic metabolic encephalopathy on underlying dementia:  Acute change in mental status noted again on the morning of 04/15/2023.  Patient was more lethargic and not very communicative. Glucose level was 103. Stat CT head on 04/15/2023 did not show any acute abnormality or stroke. Suspect lethargy/new change in mental status may be due to baclofen. After discussion with patient and Cindy at the bedside, baclofen has been discontinued.  Of note, baclofen was admitted for medication that had just been prescribed by her physician at the nursing home and she had not started it yet.  She was previously on methocarbamol which was discontinued (a day or 2 prior to admission) because of concerns of mental status changes. Arline Asp also confirmed that patient has dementia and it is suspected that dementia may be slowly getting worse.   CKD stage IV: Creatinine is stable   Leg cramps: Analgesics as needed for pain   History of DVT on Eliquis   Constipation: Laxatives as  needed.   Comorbidities include CAD, chronic low back pain, chronic bilateral hip pain, hypertension, GERD   Monitor in the hospital overnight and discharged back to long-term care facility tomorrow if mental status improves.  Diet Order             Diet Heart Room service appropriate? Yes; Fluid consistency: Thin  Diet effective now                            Consultants: None  Procedures: None    Medications:    acidophilus  1 capsule Oral Daily   [START ON 04/16/2023] amoxicillin  250 mg Oral Q12H   apixaban  2.5 mg Oral BID   famotidine  20 mg Oral QHS   metoprolol tartrate  25 mg Oral BID   pantoprazole  40 mg Oral Daily   pravastatin  20 mg Oral q1800   senna-docusate  1 tablet Oral QHS   sodium bicarbonate  650 mg Oral Daily   Continuous Infusions:     Anti-infectives (From admission, onward)    Start     Dose/Rate Route Frequency Ordered Stop   04/16/23 1000  amoxicillin (AMOXIL) capsule 250 mg        250 mg Oral Every 12 hours 04/15/23 1259 04/18/23 0959   04/14/23 1000  cefTRIAXone (ROCEPHIN) 1 g in sodium chloride 0.9 % 100 mL IVPB  Status:  Discontinued        1 g 200 mL/hr over 30 Minutes Intravenous  Every 24 hours 04/13/23 1137 04/15/23 1257   04/13/23 1115  cefTRIAXone (ROCEPHIN) 1 g in sodium chloride 0.9 % 100 mL IVPB        1 g 200 mL/hr over 30 Minutes Intravenous STAT 04/13/23 1104 04/13/23 1331              Family Communication/Anticipated D/C date and plan/Code Status   DVT prophylaxis: apixaban (ELIQUIS) tablet 2.5 mg Start: 04/13/23 2200 apixaban (ELIQUIS) tablet 2.5 mg     Code Status: Limited: Do not attempt resuscitation (DNR) -DNR-LIMITED -Do Not Intubate/DNI   Family Communication: Plan discussed with Onalee Hua, son, and Arline Asp, daughter-in-law, at the bedside Disposition Plan: Plan to discharge to long-term care facility   Status is: Observation The patient will require care spanning > 2 midnights and  should be moved to inpatient because: Lethargy/change in mental status       Subjective:   Interval events noted.  Patient cannot provide much history.  Cindy, daughter-in-law, at the bedside.  Arline Asp said that patient was more lethargic this morning.  Jill Side, RN, was at the bedside.  Objective:    Vitals:   04/14/23 1957 04/15/23 0521 04/15/23 0722 04/15/23 1244  BP: (!) 147/64 (!) 156/80 123/77 (!) 102/55  Pulse: 87 84 78 (!) 52  Resp: 18 18 16 15   Temp: 97.8 F (36.6 C) (!) 97 F (36.1 C) 98.5 F (36.9 C)   TempSrc:   Oral   SpO2: 100% 99% 99% 91%  Weight:      Height:       No data found.   Intake/Output Summary (Last 24 hours) at 04/15/2023 1259 Last data filed at 04/14/2023 1915 Gross per 24 hour  Intake 220 ml  Output --  Net 220 ml   Filed Weights   04/13/23 0810  Weight: 70.3 kg    Exam:  GEN: NAD SKIN: Warm and dry EYES: No pallor or icterus ENT: MMM CV: RRR PULM: CTA B ABD: soft, ND, NT, +BS CNS: Lethargic but arousable and responsive.  She recognized Arline Asp, daughter-in-law at the bedside non focal.  She moved all extremities spontaneously.  No facial droop or focal deficits noted.  She tends to keep her mouth open for long periods but Arline Asp said this was normal for her. EXT: No edema or tenderness       Data Reviewed:   I have personally reviewed following labs and imaging studies:  Labs: Labs show the following:   Basic Metabolic Panel: Recent Labs  Lab 04/13/23 0816 04/14/23 0417  NA 140 137  K 4.7 4.6  CL 114* 112*  CO2 13* 17*  GLUCOSE 98 88  BUN 41* 40*  CREATININE 1.88* 1.68*  CALCIUM 9.4 8.7*   GFR Estimated Creatinine Clearance: 22.2 mL/min (A) (by C-G formula based on SCr of 1.68 mg/dL (H)). Liver Function Tests: Recent Labs  Lab 04/13/23 0816  AST 13*  ALT 9  ALKPHOS 91  BILITOT 0.5  PROT 6.5  ALBUMIN 3.2*   No results for input(s): "LIPASE", "AMYLASE" in the last 168 hours. No results for input(s):  "AMMONIA" in the last 168 hours. Coagulation profile Recent Labs  Lab 04/13/23 0816  INR 1.3*    CBC: Recent Labs  Lab 04/13/23 0816 04/14/23 0417  WBC 7.6 6.7  HGB 10.8* 9.4*  HCT 35.6* 30.1*  MCV 103.8* 100.3*  PLT 287 252   Cardiac Enzymes: No results for input(s): "CKTOTAL", "CKMB", "CKMBINDEX", "TROPONINI" in the last 168 hours. BNP (last 3 results)  No results for input(s): "PROBNP" in the last 8760 hours. CBG: Recent Labs  Lab 04/15/23 1024  GLUCAP 103*   D-Dimer: No results for input(s): "DDIMER" in the last 72 hours. Hgb A1c: No results for input(s): "HGBA1C" in the last 72 hours. Lipid Profile: No results for input(s): "CHOL", "HDL", "LDLCALC", "TRIG", "CHOLHDL", "LDLDIRECT" in the last 72 hours. Thyroid function studies: No results for input(s): "TSH", "T4TOTAL", "T3FREE", "THYROIDAB" in the last 72 hours.  Invalid input(s): "FREET3" Anemia work up: No results for input(s): "VITAMINB12", "FOLATE", "FERRITIN", "TIBC", "IRON", "RETICCTPCT" in the last 72 hours. Sepsis Labs: Recent Labs  Lab 04/13/23 0816 04/14/23 0417  WBC 7.6 6.7    Microbiology Recent Results (from the past 240 hour(s))  Urine Culture     Status: Abnormal   Collection Time: 04/13/23  9:40 AM   Specimen: Urine, Random  Result Value Ref Range Status   Specimen Description   Final    URINE, RANDOM Performed at [facility], [address] [place], [place] [address]    Special Requests   Final    NONE Reflexed from [id number] Performed at [facility], [address] [place], [place] [address]    Culture 50,000 COLONIES/mL PROTEUS MIRABILIS (A)  Final   Report Status 04/15/2023 FINAL  Final   Organism ID, Bacteria PROTEUS MIRABILIS (A)  Final      Susceptibility   Proteus mirabilis - MIC*    AMPICILLIN <=2 SENSITIVE Sensitive     CEFAZOLIN <=4 SENSITIVE Sensitive     CEFEPIME <=0.12 SENSITIVE Sensitive     CEFTRIAXONE <=0.25 SENSITIVE Sensitive      CIPROFLOXACIN <=0.25 SENSITIVE Sensitive     GENTAMICIN <=1 SENSITIVE Sensitive     IMIPENEM 2 SENSITIVE Sensitive     NITROFURANTOIN RESISTANT Resistant     TRIMETH/SULFA <=20 SENSITIVE Sensitive     AMPICILLIN/SULBACTAM <=2 SENSITIVE Sensitive     PIP/TAZO <=4 SENSITIVE Sensitive ug/mL    * 50,000 COLONIES/mL PROTEUS MIRABILIS  Resp panel by RT-PCR (RSV, Flu A&B, Covid) Anterior Nasal Swab     Status: None   Collection Time: 04/13/23 10:02 AM   Specimen: Anterior Nasal Swab  Result Value Ref Range Status   SARS Coronavirus 2 by RT PCR NEGATIVE NEGATIVE Final    Comment: (NOTE) SARS-CoV-2 target nucleic acids are NOT DETECTED.  The SARS-CoV-2 RNA is generally detectable in upper respiratory specimens during the acute phase of infection. The lowest concentration of SARS-CoV-2 viral copies this assay can detect is 138 copies/mL. A negative result does not preclude SARS-Cov-2 infection and should not be used as the sole basis for treatment or other patient management decisions. A negative result may occur with  improper specimen collection/handling, submission of specimen other than nasopharyngeal swab, presence of viral mutation(s) within the areas targeted by this assay, and inadequate number of viral copies(<138 copies/mL). A negative result must be combined with clinical observations, patient history, and epidemiological information. The expected result is Negative.  Fact Sheet for Patients:  BloggerCourse.com  Fact Sheet for Healthcare Providers:  SeriousBroker.it  This test is no t yet approved or cleared by the [place] FDA and  has been authorized for detection and/or diagnosis of SARS-CoV-2 by FDA under an Emergency Use Authorization (EUA). This EUA will remain  in effect (meaning this test can be used) for the duration of the COVID-19 declaration under Section 564(b)(1) of the Act, 21 U.S.C.section 360bbb-3(b)(1),  unless the authorization is terminated  or revoked sooner.  Influenza A by PCR NEGATIVE NEGATIVE Final   Influenza B by PCR NEGATIVE NEGATIVE Final    Comment: (NOTE) The Xpert Xpress SARS-CoV-2/FLU/RSV plus assay is intended as an aid in the diagnosis of influenza from Nasopharyngeal swab specimens and should not be used as a sole basis for treatment. Nasal washings and aspirates are unacceptable for Xpert Xpress SARS-CoV-2/FLU/RSV testing.  Fact Sheet for Patients: BloggerCourse.com  Fact Sheet for Healthcare Providers: SeriousBroker.it  This test is not yet approved or cleared by the [place] FDA and has been authorized for detection and/or diagnosis of SARS-CoV-2 by FDA under an Emergency Use Authorization (EUA). This EUA will remain in effect (meaning this test can be used) for the duration of the COVID-19 declaration under Section 564(b)(1) of the Act, 21 U.S.C. section 360bbb-3(b)(1), unless the authorization is terminated or revoked.     Resp Syncytial Virus by PCR NEGATIVE NEGATIVE Final    Comment: (NOTE) Fact Sheet for Patients: BloggerCourse.com  Fact Sheet for Healthcare Providers: SeriousBroker.it  This test is not yet approved or cleared by the [place] FDA and has been authorized for detection and/or diagnosis of SARS-CoV-2 by FDA under an Emergency Use Authorization (EUA). This EUA will remain in effect (meaning this test can be used) for the duration of the COVID-19 declaration under Section 564(b)(1) of the Act, 21 U.S.C. section 360bbb-3(b)(1), unless the authorization is terminated or revoked.  Performed at [facility], [address] [place], [place] [address]     Procedures and diagnostic studies:  CT HEAD WO CONTRAST ( )  Result Date: 04/15/2023 CLINICAL DATA:  89 year old female up to mental status. EXAM: CT HEAD  WITHOUT CONTRAST TECHNIQUE: Contiguous axial images were obtained from the base of the skull through the vertex without intravenous contrast. RADIATION DOSE REDUCTION: This exam was performed according to the departmental dose-optimization program which includes automated exposure control, adjustment of the mA and/or kV according to patient size and/or use of iterative reconstruction technique. COMPARISON:  Head CT 04/13/2023 and earlier. FINDINGS: Brain: Cerebral volume is normal for age. No midline shift, ventriculomegaly, mass effect, evidence of mass lesion, intracranial hemorrhage or evidence of cortically based acute infarction. Scattered and patchy bilateral cerebral white matter hypodensity including at the left external capsule. Stable gray-white matter differentiation throughout the brain. Vascular: No suspicious intracranial vascular hyperdensity. Calcified atherosclerosis at the skull base. Skull: No acute osseous abnormality identified. Sinuses/Orbits: Visualized paranasal sinuses and mastoids are stable and well aerated. Other: No acute orbit or scalp soft tissue finding. IMPRESSION: 1. No acute intracranial abnormality. 2. Stable non contrast CT appearance of chronic small vessel disease. Electronically Signed   By: Odessa Fleming M.D.   On: 04/15/2023 11:24               LOS: 0 days   Anjannette Gauger  [facility]   Pager on [email or web address]. If 7PM-7AM, please contact night-coverage at [email or web address]     04/15/2023, 12:59 PM

## 2023-04-15 NOTE — Plan of Care (Signed)
Problem: Education: Goal: Knowledge of General Education information will improve Description: Including pain rating scale, medication(s)/side effects and non-pharmacologic comfort measures Outcome: Progressing   Problem: Activity: Goal: Risk for activity intolerance will decrease Outcome: Progressing   Problem: Nutrition: Goal: Adequate nutrition will be maintained Outcome: Progressing   Problem: Coping: Goal: Level of anxiety will decrease Outcome: Progressing   Problem: Elimination: Goal: Will not experience complications related to bowel motility Outcome: Progressing   Problem: Pain Management: Goal: General experience of comfort will improve Outcome: Progressing   Problem: Safety: Goal: Ability to remain free from injury will improve Outcome: Progressing   Problem: Skin Integrity: Goal: Risk for impaired skin integrity will decrease Outcome: Progressing

## 2023-04-15 NOTE — Progress Notes (Addendum)
PT Cancellation Note  Patient Details Name: Rachel Jones MRN: [id number] DOB: [birth year]   Cancelled Treatment:    Reason Eval/Treat Not Completed: Fatigue/lethargy limiting ability to participate;Patient's level of consciousness. PT orders received and pt chart reviewed. Per conversation with NT at bedside, pt was able to actively participate and roll in bed yesterday but did not provide any active/purposeful assist with bed-level bath this morning. NT also reports that pt has not been talking, eating, or drinking. This morning, she is supine in bed with eyes closed; attends to verbal/tactile stimulus but continues to be non-verbal and does not confirm participation with PT. Therapy to attempt evaluation later today, as appropriate.   ADDENDUM 1346: Pt supine asleep in bed with family at bedside. Pt continues to be somnolent. Per family, MD states that baclofen is likely making her very sleepy. Will continue with therapy attempts tomorrow as appropriate.    Vira Blanco, PT, DPT 10:08 AM,04/15/23 Physical Therapist - [facility]

## 2023-04-16 DIAGNOSIS — E785 Hyperlipidemia, unspecified: Secondary | ICD-10-CM | POA: Diagnosis present

## 2023-04-16 DIAGNOSIS — Z923 Personal history of irradiation: Secondary | ICD-10-CM | POA: Diagnosis not present

## 2023-04-16 DIAGNOSIS — E872 Acidosis, unspecified: Secondary | ICD-10-CM | POA: Diagnosis present

## 2023-04-16 DIAGNOSIS — I2510 Atherosclerotic heart disease of native coronary artery without angina pectoris: Secondary | ICD-10-CM | POA: Diagnosis present

## 2023-04-16 DIAGNOSIS — M545 Low back pain, unspecified: Secondary | ICD-10-CM | POA: Diagnosis present

## 2023-04-16 DIAGNOSIS — M25551 Pain in right hip: Secondary | ICD-10-CM | POA: Diagnosis present

## 2023-04-16 DIAGNOSIS — Z1152 Encounter for screening for COVID-19: Secondary | ICD-10-CM | POA: Diagnosis not present

## 2023-04-16 DIAGNOSIS — D631 Anemia in chronic kidney disease: Secondary | ICD-10-CM | POA: Diagnosis present

## 2023-04-16 DIAGNOSIS — G928 Other toxic encephalopathy: Secondary | ICD-10-CM | POA: Diagnosis present

## 2023-04-16 DIAGNOSIS — G4733 Obstructive sleep apnea (adult) (pediatric): Secondary | ICD-10-CM | POA: Diagnosis present

## 2023-04-16 DIAGNOSIS — K5900 Constipation, unspecified: Secondary | ICD-10-CM | POA: Diagnosis present

## 2023-04-16 DIAGNOSIS — G8929 Other chronic pain: Secondary | ICD-10-CM | POA: Diagnosis present

## 2023-04-16 DIAGNOSIS — G9341 Metabolic encephalopathy: Secondary | ICD-10-CM | POA: Diagnosis present

## 2023-04-16 DIAGNOSIS — N3001 Acute cystitis with hematuria: Secondary | ICD-10-CM | POA: Diagnosis not present

## 2023-04-16 DIAGNOSIS — R252 Cramp and spasm: Secondary | ICD-10-CM | POA: Diagnosis present

## 2023-04-16 DIAGNOSIS — N184 Chronic kidney disease, stage 4 (severe): Secondary | ICD-10-CM | POA: Diagnosis present

## 2023-04-16 DIAGNOSIS — N390 Urinary tract infection, site not specified: Secondary | ICD-10-CM | POA: Diagnosis present

## 2023-04-16 DIAGNOSIS — I129 Hypertensive chronic kidney disease with stage 1 through stage 4 chronic kidney disease, or unspecified chronic kidney disease: Secondary | ICD-10-CM | POA: Diagnosis present

## 2023-04-16 DIAGNOSIS — F0390 Unspecified dementia without behavioral disturbance: Secondary | ICD-10-CM | POA: Diagnosis present

## 2023-04-16 DIAGNOSIS — K219 Gastro-esophageal reflux disease without esophagitis: Secondary | ICD-10-CM | POA: Diagnosis present

## 2023-04-16 DIAGNOSIS — M069 Rheumatoid arthritis, unspecified: Secondary | ICD-10-CM | POA: Diagnosis present

## 2023-04-16 DIAGNOSIS — J449 Chronic obstructive pulmonary disease, unspecified: Secondary | ICD-10-CM | POA: Diagnosis present

## 2023-04-16 DIAGNOSIS — B964 Proteus (mirabilis) (morganii) as the cause of diseases classified elsewhere: Secondary | ICD-10-CM | POA: Diagnosis present

## 2023-04-16 DIAGNOSIS — Z993 Dependence on wheelchair: Secondary | ICD-10-CM | POA: Diagnosis not present

## 2023-04-16 DIAGNOSIS — Z7901 Long term (current) use of anticoagulants: Secondary | ICD-10-CM | POA: Diagnosis not present

## 2023-04-16 DIAGNOSIS — Z66 Do not resuscitate: Secondary | ICD-10-CM | POA: Diagnosis present

## 2023-04-16 DIAGNOSIS — R410 Disorientation, unspecified: Secondary | ICD-10-CM | POA: Diagnosis present

## 2023-04-16 LAB — CBC WITH DIFFERENTIAL/PLATELET
Abs Immature Granulocytes: 0.07 10*3/uL (ref 0.00–0.07)
Basophils Absolute: 0.1 10*3/uL (ref 0.0–0.1)
Basophils Relative: 0 %
Eosinophils Absolute: 0 10*3/uL (ref 0.0–0.5)
Eosinophils Relative: 0 %
HCT: 36.4 % (ref 36.0–46.0)
Hemoglobin: 11.7 g/dL — ABNORMAL LOW (ref 12.0–15.0)
Immature Granulocytes: 1 %
Lymphocytes Relative: 5 %
Lymphs Abs: 0.7 10*3/uL (ref 0.7–4.0)
MCH: 31.4 pg (ref 26.0–34.0)
MCHC: 32.1 g/dL (ref 30.0–36.0)
MCV: 97.6 fL (ref 80.0–100.0)
Monocytes Absolute: 0.9 10*3/uL (ref 0.1–1.0)
Monocytes Relative: 7 %
Neutro Abs: 11.3 10*3/uL — ABNORMAL HIGH (ref 1.7–7.7)
Neutrophils Relative %: 87 %
Platelets: 309 10*3/uL (ref 150–400)
RBC: 3.73 MIL/uL — ABNORMAL LOW (ref 3.87–5.11)
RDW: 13.2 % (ref 11.5–15.5)
WBC: 13 10*3/uL — ABNORMAL HIGH (ref 4.0–10.5)
nRBC: 0 % (ref 0.0–0.2)

## 2023-04-16 LAB — RENAL FUNCTION PANEL
Albumin: 2.8 g/dL — ABNORMAL LOW (ref 3.5–5.0)
Anion gap: 11 (ref 5–15)
BUN: 30 mg/dL — ABNORMAL HIGH (ref 8–23)
CO2: 17 mmol/L — ABNORMAL LOW (ref 22–32)
Calcium: 9.5 mg/dL (ref 8.9–10.3)
Chloride: 112 mmol/L — ABNORMAL HIGH (ref 98–111)
Creatinine, Ser: 1.62 mg/dL — ABNORMAL HIGH (ref 0.44–1.00)
GFR, Estimated: 31 mL/min — ABNORMAL LOW (ref >60)
Glucose, Bld: 113 mg/dL — ABNORMAL HIGH (ref 70–99)
Phosphorus: 3.2 mg/dL (ref 2.5–4.6)
Potassium: 4.9 mmol/L (ref 3.5–5.1)
Sodium: 140 mmol/L (ref 135–145)

## 2023-04-16 MED ORDER — SODIUM BICARBONATE 650 MG PO TABS
650.0000 mg | ORAL_TABLET | Freq: Two times a day (BID) | ORAL | Status: DC
  Administered 2023-04-16 – 2023-04-17 (×2): 650 mg via ORAL
  Filled 2023-04-16 (×2): qty 1

## 2023-04-16 NOTE — Evaluation (Signed)
Physical Therapy Evaluation Patient Details Name: Rachel Jones MRN: [id number] DOB: [birth year] Today's Date: 04/16/2023  History of Present Illness  Pt admitted to [facility] on 04/13/23 under observation from [facility] for c/o AMS and decreased PO intake. Found to have UTI with acute metabolic encephalopathy on underlying dementia. PMH significant for: dementia, recurrent UTI's, hx DVT (on Eliquis), HTN, GERD, CKD (IV), OSA on CPAP, and RA. Imaging negative for acute abnormality.   Clinical Impression  Pt is a 89 year old F admitted to hospital on 04/13/23 for acute metabolic encephalopathy. PLOF obtained from daughter, Geraldine Contras, at bedside. At baseline, pt requires hoyer lift for transfers OOB and to/from toilet and WC. She is mod I for mobilization of WC at facility with BUE for support. She is mod I for dressing, grooming, and feeding, and requires assist from staff for bathing, toileting, medication management, and IADL's.   Pt presents with generalized weakness, RLE knee flexion contracture, baseline confusion, decreased gross balance, decreased activity tolerance, and increased pain levels resulting in impaired functional mobility. Due to deficits, pt required total assist for supine<>sit transfers and supine scooting towards [facility] for repositioning. Poor static seated balance at EOB without appropriate UE support; when she was able to maintain UE support she was able to demonstrate improvements in static balance required for ADL's.  She will benefit from acute PT services to address [facility] mobility prior to return to facility. She will benefit from post acute therapy services.             Can travel by private vehicle   No             Precautions / Restrictions Precautions Precautions: Fall Precaution Comments: DNR      Mobility  Bed Mobility               General bed mobility comments: total assist for supine<>Sit transfer with set up for assistance with RUE; unable to  assist with RUE on bedrail. Total assist for supine scooting towards HOB for repositioning with bed in trendelenburg position        Balance Overall balance assessment: Needs assistance   Sitting balance-Leahy Scale: Zero Sitting balance - Comments: max-total assist for static seated balance. Improved balance with use of BUE for support, but due to fatigue, BUE support limited <5s                                     Pertinent Vitals/Pain Pain Assessment Pain Assessment: Faces Faces Pain Scale: Hurts little more Pain Location: L knee with movement Pain Intervention(s): Monitored during session, Limited activity within patient's tolerance    Home Living Family/patient expects to be discharged to:: Skilled nursing facility                        Prior Function               Mobility Comments: Facility assists with hoyer lift OOB, able to mobilize self in manual WC with BUE use ADLs Comments: IND for dressing and grooming; assist with toileting, medication, and IADL's from facility staff     Extremity/Trunk Assessment   Upper Extremity Assessment Upper Extremity Assessment: Generalized weakness    Lower Extremity Assessment Lower Extremity Assessment: Generalized weakness (L knee flexion contracture @ 90deg)       Communication   Communication Communication: Difficulty following commands/understanding Following commands:  Follows one step commands inconsistently;Follows one step commands with increased time  Cognition Arousal: Alert   Overall Cognitive Status: History of cognitive impairments - at baseline                                                 Assessment/Plan    PT Assessment All further PT needs can be met in the next venue of care  PT Problem List Decreased strength;Decreased balance;Decreased mobility;Decreased cognition;Obesity;Decreased activity tolerance;Decreased safety awareness;Pain       PT  Treatment Interventions Functional mobility training;Therapeutic activities;Therapeutic exercise;Balance training;Neuromuscular re-education;Manual techniques    PT Goals (Current goals can be found in the Care Plan section)  Acute Rehab PT Goals PT Goal Formulation: Patient unable to participate in goal setting Additional Goals Additional Goal #1: Pt will be IND with WC mobilization of 58ft, to promote independence and reduce caregiver burden.    Frequency Min 1X/week        AM-PAC PT "6 Clicks" Mobility  Outcome Measure Help needed turning from your back to your side while in a flat bed without using bedrails?: Total Help needed moving from lying on your back to sitting on the side of a flat bed without using bedrails?: Total Help needed moving to and from a bed to a chair (including a wheelchair)?: Total Help needed standing up from a chair using your arms (e.g., wheelchair or bedside chair)?: Total Help needed to walk in hospital room?: Total Help needed climbing 3-5 steps with a railing? : Total 6 Click Score: 6    End of Session   Activity Tolerance: Patient limited by fatigue Patient left: in bed;with bed alarm set Nurse Communication: Mobility status PT Visit Diagnosis: Muscle weakness (generalized) (M62.81)    Time: 0981-1914 PT Time Calculation (min) (ACUTE ONLY): 22 min   Charges:   PT Evaluation $PT Eval Low Complexity: 1 Low PT Treatments $Therapeutic Activity: 8-22 mins PT General Charges $$ ACUTE PT VISIT: 1 Visit        Vira Blanco, PT, DPT 12:58 PM,04/16/23 Physical Therapist - [facility]

## 2023-04-16 NOTE — Progress Notes (Signed)
Patietn continue same over lethargic and confuse, refusing her medication, and anything oral.. she is alert to to self and place and does  wake up when spoken to but is delayed  in responding. Otherwise her v/s are stable and no complaint of pain or discomfort. Reorientation and stimulation done through the night.

## 2023-04-16 NOTE — Progress Notes (Addendum)
Progress Note    Rachel Jones  ZOX:[id number] DOB: 03-14-1934  DOA: 04/13/2023 PCP: System, Provider Not In      Brief Narrative:    Medical records reviewed and are as summarized below:  Rachel Jones is a 89 y.o. female  with medical history significant for dementia, recurrent UTIs, DVT on prophylactic dosage of Eliquis, CAD, HTN, GERD, CKD stage IV, OSA on CPAP, leg cramps recently started on baclofen, chronic back pain, who was brought from the nursing home because of increasing confusion.      Assessment/Plan:   Principal Problem:   Acute metabolic encephalopathy Active Problems:   CKD (chronic kidney disease), stage IV (HCC)   UTI (urinary tract infection)   Dementia without behavioral disturbance (HCC)   Toxic metabolic encephalopathy    Body mass index is 27.46 kg/m.   Acute UTI in a patient with recurrent UTIs: Urine culture showed Proteus mirabilis.  Discontinue IV ceftriaxone and start amoxicillin.    Acute toxic metabolic encephalopathy on underlying dementia:  Repeat CT head did not show any acute abnormality Suspect baclofen is contributing to this episode of baclofen has been discontinued. Of note, baclofen had just been prescribed by her physician at the nursing home and she had not started it yet.  She was previously on methocarbamol which was discontinued (a day or 2 prior to admission) because of concerns of mental status changes.    CKD stage IV, metabolic acidosis: Creatinine is stable.   Continue sodium bicarbonate   Leg cramps: Analgesics as needed for pain   History of DVT on Eliquis   Constipation: Laxatives as needed.   Comorbidities include CAD, chronic low back pain, chronic bilateral hip pain, hypertension, GERD   Plan to discharge to long-term care facility tomorrow if mental status improves to baseline.  Diet Order             Diet Heart Room service appropriate? Yes; Fluid consistency: Thin  Diet effective now                             Consultants: None  Procedures: None    Medications:    acidophilus  1 capsule Oral Daily   amoxicillin  250 mg Oral Q12H   apixaban  2.5 mg Oral BID   famotidine  20 mg Oral QHS   metoprolol tartrate  25 mg Oral BID   pantoprazole  40 mg Oral Daily   pravastatin  20 mg Oral q1800   senna-docusate  1 tablet Oral QHS   sodium bicarbonate  650 mg Oral Daily   Continuous Infusions:  lactated ringers 75 mL/hr at 04/16/23 0736      Anti-infectives (From admission, onward)    Start     Dose/Rate Route Frequency Ordered Stop   04/16/23 1000  amoxicillin (AMOXIL) capsule 250 mg        250 mg Oral Every 12 hours 04/15/23 1259 04/18/23 0959   04/14/23 1000  cefTRIAXone (ROCEPHIN) 1 g in sodium chloride 0.9 % 100 mL IVPB  Status:  Discontinued        1 g 200 mL/hr over 30 Minutes Intravenous Every 24 hours 04/13/23 1137 04/15/23 1257   04/13/23 1115  cefTRIAXone (ROCEPHIN) 1 g in sodium chloride 0.9 % 100 mL IVPB        1 g 200 mL/hr over 30 Minutes Intravenous STAT 04/13/23 1104 04/13/23 1331  Family Communication/Anticipated D/C date and plan/Code Status   DVT prophylaxis: apixaban (ELIQUIS) tablet 2.5 mg Start: 04/13/23 2200 apixaban (ELIQUIS) tablet 2.5 mg     Code Status: Limited: Do not attempt resuscitation (DNR) -DNR-LIMITED -Do Not Intubate/DNI   Family Communication: Plan discussed with Lupita Leash, daughter and granddaughter at the bedside Disposition Plan: Plan to discharge to long-term care facility   Status is: Inpatient Remains inpatient appropriate because: Altered mental status         Subjective:   Interval events noted.  Lupita Leash (daughter) and Konrad Felix (granddaughter) were at the bedside.  They report that patient is still lethargic and not back to baseline.  Objective:    Vitals:   04/15/23 2026 04/16/23 0414 04/16/23 0431 04/16/23 1058  BP: (!) 167/69 (!) 178/79 (!) 148/80 131/75   Pulse: 79  95 (!) 110  Resp: 18 16  20   Temp: 97.6 F (36.4 C) 99 F (37.2 C)  98 F (36.7 C)  TempSrc: Oral     SpO2: 97% 98%  98%  Weight:      Height:       No data found.   Intake/Output Summary (Last 24 hours) at 04/16/2023 1500 Last data filed at 04/15/2023 1849 Gross per 24 hour  Intake 5.06 ml  Output --  Net 5.06 ml   Filed Weights   04/13/23 0810  Weight: 70.3 kg    Exam:   GEN: NAD SKIN: Warm and dry EYES: No pallor or icterus ENT: MMM CV: RRR PULM: CTA B ABD: soft, ND, NT, +BS CNS: Alert and oriented to person..   EXT: No edema or tenderness       Data Reviewed:   I have personally reviewed following labs and imaging studies:  Labs: Labs show the following:   Basic Metabolic Panel: Recent Labs  Lab 04/13/23 0816 04/14/23 0417 04/16/23 0746  NA 140 137 140  K 4.7 4.6 4.9  CL 114* 112* 112*  CO2 13* 17* 17*  GLUCOSE 98 88 113*  BUN 41* 40* 30*  CREATININE 1.88* 1.68* 1.62*  CALCIUM 9.4 8.7* 9.5  PHOS  --   --  3.2   GFR Estimated Creatinine Clearance: 23 mL/min (A) (by C-G formula based on SCr of 1.62 mg/dL (H)). Liver Function Tests: Recent Labs  Lab 04/13/23 0816 04/16/23 0746  AST 13*  --   ALT 9  --   ALKPHOS 91  --   BILITOT 0.5  --   PROT 6.5  --   ALBUMIN 3.2* 2.8*   No results for input(s): "LIPASE", "AMYLASE" in the last 168 hours. No results for input(s): "AMMONIA" in the last 168 hours. Coagulation profile Recent Labs  Lab 04/13/23 0816  INR 1.3*    CBC: Recent Labs  Lab 04/13/23 0816 04/14/23 0417 04/16/23 0746  WBC 7.6 6.7 13.0*  NEUTROABS  --   --  11.3*  HGB 10.8* 9.4* 11.7*  HCT 35.6* 30.1* 36.4  MCV 103.8* 100.3* 97.6  PLT 287 252 309   Cardiac Enzymes: No results for input(s): "CKTOTAL", "CKMB", "CKMBINDEX", "TROPONINI" in the last 168 hours. BNP (last 3 results) No results for input(s): "PROBNP" in the last 8760 hours. CBG: Recent Labs  Lab 04/15/23 1024  GLUCAP 103*    D-Dimer: No results for input(s): "DDIMER" in the last 72 hours. Hgb A1c: No results for input(s): "HGBA1C" in the last 72 hours. Lipid Profile: No results for input(s): "CHOL", "HDL", "LDLCALC", "TRIG", "CHOLHDL", "LDLDIRECT" in the last 72 hours. Thyroid  function studies: No results for input(s): "TSH", "T4TOTAL", "T3FREE", "THYROIDAB" in the last 72 hours.  Invalid input(s): "FREET3" Anemia work up: No results for input(s): "VITAMINB12", "FOLATE", "FERRITIN", "TIBC", "IRON", "RETICCTPCT" in the last 72 hours. Sepsis Labs: Recent Labs  Lab 04/13/23 0816 04/14/23 0417 04/16/23 0746  WBC 7.6 6.7 13.0*    Microbiology Recent Results (from the past 240 hour(s))  Urine Culture     Status: Abnormal   Collection Time: 04/13/23  9:40 AM   Specimen: Urine, Random  Result Value Ref Range Status   Specimen Description   Final    URINE, RANDOM Performed at [facility], [address] [place], [place] [address]    Special Requests   Final    NONE Reflexed from [id number] Performed at [facility], [address] [place], [place] [address]    Culture 50,000 COLONIES/mL PROTEUS MIRABILIS (A)  Final   Report Status 04/15/2023 FINAL  Final   Organism ID, Bacteria PROTEUS MIRABILIS (A)  Final      Susceptibility   Proteus mirabilis - MIC*    AMPICILLIN <=2 SENSITIVE Sensitive     CEFAZOLIN <=4 SENSITIVE Sensitive     CEFEPIME <=0.12 SENSITIVE Sensitive     CEFTRIAXONE <=0.25 SENSITIVE Sensitive     CIPROFLOXACIN <=0.25 SENSITIVE Sensitive     GENTAMICIN <=1 SENSITIVE Sensitive     IMIPENEM 2 SENSITIVE Sensitive     NITROFURANTOIN RESISTANT Resistant     TRIMETH/SULFA <=20 SENSITIVE Sensitive     AMPICILLIN/SULBACTAM <=2 SENSITIVE Sensitive     PIP/TAZO <=4 SENSITIVE Sensitive ug/mL    * 50,000 COLONIES/mL PROTEUS MIRABILIS  Resp panel by RT-PCR (RSV, Flu A&B, Covid) Anterior Nasal Swab     Status: None   Collection Time: 04/13/23 10:02 AM   Specimen:  Anterior Nasal Swab  Result Value Ref Range Status   SARS Coronavirus 2 by RT PCR NEGATIVE NEGATIVE Final    Comment: (NOTE) SARS-CoV-2 target nucleic acids are NOT DETECTED.  The SARS-CoV-2 RNA is generally detectable in upper respiratory specimens during the acute phase of infection. The lowest concentration of SARS-CoV-2 viral copies this assay can detect is 138 copies/mL. A negative result does not preclude SARS-Cov-2 infection and should not be used as the sole basis for treatment or other patient management decisions. A negative result may occur with  improper specimen collection/handling, submission of specimen other than nasopharyngeal swab, presence of viral mutation(s) within the areas targeted by this assay, and inadequate number of viral copies(<138 copies/mL). A negative result must be combined with clinical observations, patient history, and epidemiological information. The expected result is Negative.  Fact Sheet for Patients:  BloggerCourse.com  Fact Sheet for Healthcare Providers:  SeriousBroker.it  This test is no t yet approved or cleared by the [place] FDA and  has been authorized for detection and/or diagnosis of SARS-CoV-2 by FDA under an Emergency Use Authorization (EUA). This EUA will remain  in effect (meaning this test can be used) for the duration of the COVID-19 declaration under Section 564(b)(1) of the Act, 21 U.S.C.section 360bbb-3(b)(1), unless the authorization is terminated  or revoked sooner.       Influenza A by PCR NEGATIVE NEGATIVE Final   Influenza B by PCR NEGATIVE NEGATIVE Final    Comment: (NOTE) The Xpert Xpress SARS-CoV-2/FLU/RSV plus assay is intended as an aid in the diagnosis of influenza from Nasopharyngeal swab specimens and should not be used as a sole basis for treatment. Nasal washings and aspirates are  unacceptable for Xpert Xpress SARS-CoV-2/FLU/RSV testing.  Fact  Sheet for Patients: BloggerCourse.com  Fact Sheet for Healthcare Providers: SeriousBroker.it  This test is not yet approved or cleared by the [place] FDA and has been authorized for detection and/or diagnosis of SARS-CoV-2 by FDA under an Emergency Use Authorization (EUA). This EUA will remain in effect (meaning this test can be used) for the duration of the COVID-19 declaration under Section 564(b)(1) of the Act, 21 U.S.C. section 360bbb-3(b)(1), unless the authorization is terminated or revoked.     Resp Syncytial Virus by PCR NEGATIVE NEGATIVE Final    Comment: (NOTE) Fact Sheet for Patients: BloggerCourse.com  Fact Sheet for Healthcare Providers: SeriousBroker.it  This test is not yet approved or cleared by the [place] FDA and has been authorized for detection and/or diagnosis of SARS-CoV-2 by FDA under an Emergency Use Authorization (EUA). This EUA will remain in effect (meaning this test can be used) for the duration of the COVID-19 declaration under Section 564(b)(1) of the Act, 21 U.S.C. section 360bbb-3(b)(1), unless the authorization is terminated or revoked.  Performed at [facility], [address] [place], [place] [address]     Procedures and diagnostic studies:  CT HEAD WO CONTRAST ( )  Result Date: 04/15/2023 CLINICAL DATA:  89 year old female up to mental status. EXAM: CT HEAD WITHOUT CONTRAST TECHNIQUE: Contiguous axial images were obtained from the base of the skull through the vertex without intravenous contrast. RADIATION DOSE REDUCTION: This exam was performed according to the departmental dose-optimization program which includes automated exposure control, adjustment of the mA and/or kV according to patient size and/or use of iterative reconstruction technique. COMPARISON:  Head CT 04/13/2023 and earlier. FINDINGS: Brain: Cerebral  volume is normal for age. No midline shift, ventriculomegaly, mass effect, evidence of mass lesion, intracranial hemorrhage or evidence of cortically based acute infarction. Scattered and patchy bilateral cerebral white matter hypodensity including at the left external capsule. Stable gray-white matter differentiation throughout the brain. Vascular: No suspicious intracranial vascular hyperdensity. Calcified atherosclerosis at the skull base. Skull: No acute osseous abnormality identified. Sinuses/Orbits: Visualized paranasal sinuses and mastoids are stable and well aerated. Other: No acute orbit or scalp soft tissue finding. IMPRESSION: 1. No acute intracranial abnormality. 2. Stable non contrast CT appearance of chronic small vessel disease. Electronically Signed   By: Odessa Fleming M.D.   On: 04/15/2023 11:24               LOS: 0 days   Jezel Basto  [facility]   Pager on [email or web address]. If 7PM-7AM, please contact night-coverage at [email or web address]     04/16/2023, 3:00 PM

## 2023-04-17 DIAGNOSIS — G9341 Metabolic encephalopathy: Secondary | ICD-10-CM | POA: Diagnosis not present

## 2023-04-17 DIAGNOSIS — N3001 Acute cystitis with hematuria: Secondary | ICD-10-CM | POA: Diagnosis not present

## 2023-04-17 MED ORDER — SODIUM BICARBONATE 650 MG PO TABS: 650.0000 mg | ORAL_TABLET | Freq: Two times a day (BID) | ORAL | Status: DC

## 2023-04-17 MED ORDER — AMOXICILLIN 250 MG PO CAPS: 250.0000 mg | ORAL_CAPSULE | Freq: Two times a day (BID) | ORAL | Status: AC

## 2023-04-17 NOTE — Discharge Summary (Signed)
Physician Discharge Summary   Patient: Rachel Jones MRN: [id number] DOB: December 04, 1933  Admit date:     04/13/2023  Discharge date: 04/17/23  Discharge Physician: Lurene Shadow   PCP: System, Provider Not In   Recommendations at discharge:   Follow-up with physician at the nursing home within 3 days of discharge Follow-up with nephrologist within 1 month of discharge   Discharge Diagnoses: Principal Problem:   Acute metabolic encephalopathy Active Problems:   CKD (chronic kidney disease), stage IV (HCC)   UTI (urinary tract infection)   Dementia without behavioral disturbance (HCC)   Toxic metabolic encephalopathy  Resolved Problems:   * No resolved hospital problems. *  Hospital Course:  Rachel Jones is a 89 y.o. female  with medical history significant for dementia, recurrent UTIs, DVT on prophylactic dosage of Eliquis, CAD, HTN, GERD, CKD stage IV, OSA on CPAP, leg cramps recently started on baclofen, chronic back pain, who was brought from the nursing home because of increasing confusion.     Assessment and Plan:  Acute UTI in a patient with recurrent UTIs: Urine culture showed Proteus mirabilis.  She received 3 days of IV ceftriaxone.  She will be discharged on amoxicillin.     Acute toxic metabolic encephalopathy on underlying dementia: Mental status has improved back to baseline. Repeat CT head did not show any acute abnormality Baclofen was suspected to be the culprit.  UTI also contributed to encephalopathy. According to [facility], baclofen had just been prescribed by her physician at the nursing home and she had not started it yet.  She was previously on methocarbamol which was discontinued (a day or 2 prior to admission) because of concerns of mental status changes.      CKD stage IV, metabolic acidosis: Creatinine is stable.   Continue sodium bicarbonate     Leg cramps: Analgesics as needed for pain     History of DVT on Eliquis     Constipation:  Laxatives as needed.     Comorbidities include CAD, chronic low back pain, chronic bilateral hip pain, hypertension, GERD    Her condition has improved and she is deemed stable for discharge to SNF today.  Discharge plan was discussed with Lupita Leash, daughter, at the bedside.       Consultants: None Procedures performed: None Disposition: Skilled nursing facility Diet recommendation:  Discharge Diet Orders (From admission, onward)     Start     Ordered   04/17/23 0000  Diet - low sodium heart healthy        04/17/23 0940           Cardiac diet DISCHARGE MEDICATION: Allergies as of 04/17/2023       Reactions   Cefuroxime Axetil Diarrhea   Allopurinol    Codeine Nausea Only   Intolerance instead of true allerg   Sulfa Antibiotics Swelling, Rash   swelling        Medication List     STOP taking these medications    Baclofen 5 MG Tabs   methocarbamol 750 MG tablet Commonly known as: ROBAXIN       TAKE these medications    acetaminophen 500 MG tablet Commonly known as: TYLENOL Take 1,000 mg by mouth 3 (three) times daily. May take an additional 1000 mgs as needed for pain   alum & mag hydroxide-simeth 200-200-20 MG/5ML suspension Commonly known as: MAALOX/MYLANTA Take 30 mLs by mouth every 4 (four) hours as needed for indigestion.   amoxicillin 250 MG capsule Commonly known  as: AMOXIL Take 1 capsule (250 mg total) by mouth every 12 (twelve) hours for 3 days.   apixaban 2.5 MG Tabs tablet Commonly known as: ELIQUIS Take 1 tablet by mouth 2 (two) times a day.   famotidine 20 MG tablet Commonly known as: PEPCID Take 20 mg by mouth every 12 (twelve) hours.   ferrous gluconate 324 MG tablet Commonly known as: FERGON Take 324 mg by mouth every other day.   fluticasone 50 MCG/ACT nasal spray Commonly known as: FLONASE Place 1 spray into both nostrils daily.   furosemide 20 MG tablet Commonly known as: LASIX Take 20 mg by mouth every other day.    guaiFENesin 100 MG/5ML liquid Commonly known as: ROBITUSSIN Take 5 mLs by mouth every 12 (twelve) hours as needed for cough or to loosen phlegm.   loperamide 2 MG capsule Commonly known as: IMODIUM Take 4 mg by mouth as needed for diarrhea or loose stools.   lovastatin 20 MG tablet Commonly known as: MEVACOR Take 20 mg by mouth daily.   Magnesium 200 MG Tabs Take 200 mg by mouth daily.   metoprolol tartrate 25 MG tablet Commonly known as: LOPRESSOR Take 50 mg by mouth daily.   multivitamin capsule Take 1 capsule by mouth daily.   nitroGLYCERIN 0.4 MG SL tablet Commonly known as: NITROSTAT Place 0.4 mg under the tongue every 5 (five) minutes as needed.   omeprazole 20 MG capsule Commonly known as: PRILOSEC Take 1 capsule (20 mg total) by mouth 2 (two) times daily before a meal.   Probiotic Advanced Caps Take 1 capsule by mouth daily.   simethicone 125 MG chewable tablet Commonly known as: MYLICON Chew 125 mg by mouth every 6 (six) hours as needed.   sodium bicarbonate 650 MG tablet Take 1 tablet (650 mg total) by mouth 2 (two) times daily.   triamcinolone cream 0.1 % Commonly known as: KENALOG Apply 1 Application topically daily.   Vitamin D3 25 MCG (1000 UT) Caps Take 1,000 Units by mouth daily.   zinc gluconate 50 MG tablet Take 50 mg by mouth daily.        Discharge Exam: Filed Weights   04/13/23 0810  Weight: 70.3 kg   GEN: NAD SKIN: Warm and dry EYES: No pallor or icterus ENT: MMM CV: RRR PULM: CTA B ABD: soft, ND, NT, +BS CNS: AAO x 1 person and place, non focal EXT: No edema or tenderness   Condition at discharge: good  The results of significant diagnostics from this hospitalization (including imaging, microbiology, ancillary and laboratory) are listed below for reference.   Imaging Studies: CT HEAD WO CONTRAST ( )  Result Date: 04/15/2023 CLINICAL DATA:  89 year old female up to mental status. EXAM: CT HEAD WITHOUT CONTRAST  TECHNIQUE: Contiguous axial images were obtained from the base of the skull through the vertex without intravenous contrast. RADIATION DOSE REDUCTION: This exam was performed according to the departmental dose-optimization program which includes automated exposure control, adjustment of the mA and/or kV according to patient size and/or use of iterative reconstruction technique. COMPARISON:  Head CT 04/13/2023 and earlier. FINDINGS: Brain: Cerebral volume is normal for age. No midline shift, ventriculomegaly, mass effect, evidence of mass lesion, intracranial hemorrhage or evidence of cortically based acute infarction. Scattered and patchy bilateral cerebral white matter hypodensity including at the left external capsule. Stable gray-white matter differentiation throughout the brain. Vascular: No suspicious intracranial vascular hyperdensity. Calcified atherosclerosis at the skull base. Skull: No acute osseous abnormality identified. Sinuses/Orbits: Visualized paranasal  sinuses and mastoids are stable and well aerated. Other: No acute orbit or scalp soft tissue finding. IMPRESSION: 1. No acute intracranial abnormality. 2. Stable non contrast CT appearance of chronic small vessel disease. Electronically Signed   By: Odessa Fleming M.D.   On: 04/15/2023 11:24   CT Head Wo Contrast  Result Date: 04/13/2023 CLINICAL DATA:  Mental status change, unknown cause EXAM: CT HEAD WITHOUT CONTRAST TECHNIQUE: Contiguous axial images were obtained from the base of the skull through the vertex without intravenous contrast. RADIATION DOSE REDUCTION: This exam was performed according to the departmental dose-optimization program which includes automated exposure control, adjustment of the mA and/or kV according to patient size and/or use of iterative reconstruction technique. COMPARISON:  CT Head 09/12/20 FINDINGS: Brain: No hemorrhage. No hydrocephalus. No extra-axial fluid collection. No CT evidence of an acute cortical infarct. No  mass effect. No mass lesion. There is sequela of moderate microvascular ischemic change. Vascular: No hyperdense vessel or unexpected calcification. Skull: Normal. Negative for fracture or focal lesion. Sinuses/Orbits: No middle ear or mastoid effusion. Paranasal sinuses clear. Bilateral lens replacement. Orbits are otherwise unremarkable. Other: None. IMPRESSION: 1. No hemorrhage or CT evidence of an acute cortical infarct. 2. Sequela of moderate microvascular ischemic change. Electronically Signed   By: Lorenza Cambridge M.D.   On: 04/13/2023 10:47   DG Chest 1 View  Result Date: 04/13/2023 CLINICAL DATA:  Confusion.  Weakness. EXAM: CHEST  1 VIEW COMPARISON:  Chest radiograph dated July 05, 2021. FINDINGS: Patient is rotated to the right. Large hiatal hernia. Normal heart size. No focal consolidation, sizeable pleural effusion, or pneumothorax. No acute osseous abnormality. IMPRESSION: No acute cardiopulmonary findings.  Large hiatal hernia. Electronically Signed   By: Hart Robinsons M.D.   On: 04/13/2023 10:13    Microbiology: Results for orders placed or performed during the hospital encounter of 04/13/23  Urine Culture     Status: Abnormal   Collection Time: 04/13/23  9:40 AM   Specimen: Urine, Random  Result Value Ref Range Status   Specimen Description   Final    URINE, RANDOM Performed at [facility], [address] [place], [place] [address]    Special Requests   Final    NONE Reflexed from [id number] Performed at [facility], [address] [place], [place] [address]    Culture 50,000 COLONIES/mL PROTEUS MIRABILIS (A)  Final   Report Status 04/15/2023 FINAL  Final   Organism ID, Bacteria PROTEUS MIRABILIS (A)  Final      Susceptibility   Proteus mirabilis - MIC*    AMPICILLIN <=2 SENSITIVE Sensitive     CEFAZOLIN <=4 SENSITIVE Sensitive     CEFEPIME <=0.12 SENSITIVE Sensitive     CEFTRIAXONE <=0.25 SENSITIVE Sensitive     CIPROFLOXACIN <=0.25 SENSITIVE  Sensitive     GENTAMICIN <=1 SENSITIVE Sensitive     IMIPENEM 2 SENSITIVE Sensitive     NITROFURANTOIN RESISTANT Resistant     TRIMETH/SULFA <=20 SENSITIVE Sensitive     AMPICILLIN/SULBACTAM <=2 SENSITIVE Sensitive     PIP/TAZO <=4 SENSITIVE Sensitive ug/mL    * 50,000 COLONIES/mL PROTEUS MIRABILIS  Resp panel by RT-PCR (RSV, Flu A&B, Covid) Anterior Nasal Swab     Status: None   Collection Time: 04/13/23 10:02 AM   Specimen: Anterior Nasal Swab  Result Value Ref Range Status   SARS Coronavirus 2 by RT PCR NEGATIVE NEGATIVE Final    Comment: (NOTE) SARS-CoV-2 target nucleic acids are NOT DETECTED.  The SARS-CoV-2  RNA is generally detectable in upper respiratory specimens during the acute phase of infection. The lowest concentration of SARS-CoV-2 viral copies this assay can detect is 138 copies/mL. A negative result does not preclude SARS-Cov-2 infection and should not be used as the sole basis for treatment or other patient management decisions. A negative result may occur with  improper specimen collection/handling, submission of specimen other than nasopharyngeal swab, presence of viral mutation(s) within the areas targeted by this assay, and inadequate number of viral copies(<138 copies/mL). A negative result must be combined with clinical observations, patient history, and epidemiological information. The expected result is Negative.  Fact Sheet for Patients:  BloggerCourse.com  Fact Sheet for Healthcare Providers:  SeriousBroker.it  This test is no t yet approved or cleared by the [place] FDA and  has been authorized for detection and/or diagnosis of SARS-CoV-2 by FDA under an Emergency Use Authorization (EUA). This EUA will remain  in effect (meaning this test can be used) for the duration of the COVID-19 declaration under Section 564(b)(1) of the Act, 21 U.S.C.section 360bbb-3(b)(1), unless the authorization is  terminated  or revoked sooner.       Influenza A by PCR NEGATIVE NEGATIVE Final   Influenza B by PCR NEGATIVE NEGATIVE Final    Comment: (NOTE) The Xpert Xpress SARS-CoV-2/FLU/RSV plus assay is intended as an aid in the diagnosis of influenza from Nasopharyngeal swab specimens and should not be used as a sole basis for treatment. Nasal washings and aspirates are unacceptable for Xpert Xpress SARS-CoV-2/FLU/RSV testing.  Fact Sheet for Patients: BloggerCourse.com  Fact Sheet for Healthcare Providers: SeriousBroker.it  This test is not yet approved or cleared by the [place] FDA and has been authorized for detection and/or diagnosis of SARS-CoV-2 by FDA under an Emergency Use Authorization (EUA). This EUA will remain in effect (meaning this test can be used) for the duration of the COVID-19 declaration under Section 564(b)(1) of the Act, 21 U.S.C. section 360bbb-3(b)(1), unless the authorization is terminated or revoked.     Resp Syncytial Virus by PCR NEGATIVE NEGATIVE Final    Comment: (NOTE) Fact Sheet for Patients: BloggerCourse.com  Fact Sheet for Healthcare Providers: SeriousBroker.it  This test is not yet approved or cleared by the [place] FDA and has been authorized for detection and/or diagnosis of SARS-CoV-2 by FDA under an Emergency Use Authorization (EUA). This EUA will remain in effect (meaning this test can be used) for the duration of the COVID-19 declaration under Section 564(b)(1) of the Act, 21 U.S.C. section 360bbb-3(b)(1), unless the authorization is terminated or revoked.  Performed at [facility], [address] [place], [place] [address]     Labs: CBC: Recent Labs  Lab 04/13/23 [phone] 04/14/23 0417 04/16/23 0746  WBC 7.6 6.7 13.0*  NEUTROABS  --   --  11.3*  HGB 10.8* 9.4* 11.7*  HCT 35.6* 30.1* 36.4  MCV 103.8* 100.3*  97.6  PLT 287 252 309   Basic Metabolic Panel: Recent Labs  Lab 04/13/23 0816 04/14/23 0417 04/16/23 0746  NA 140 137 140  K 4.7 4.6 4.9  CL 114* 112* 112*  CO2 13* 17* 17*  GLUCOSE 98 88 113*  BUN 41* 40* 30*  CREATININE 1.88* 1.68* 1.62*  CALCIUM 9.4 8.7* 9.5  PHOS  --   --  3.2   Liver Function Tests: Recent Labs  Lab 04/13/23 0816 04/16/23 0746  AST 13*  --   ALT 9  --   ALKPHOS 91  --  BILITOT 0.5  --   PROT 6.5  --   ALBUMIN 3.2* 2.8*   CBG: Recent Labs  Lab 04/15/23 1024  GLUCAP 103*    Discharge time spent: greater than 30 minutes.  Signed: Lurene Shadow, MD [facility] 04/17/2023

## 2023-04-17 NOTE — TOC Transition Note (Addendum)
Transition of Care [facility]) - CM/SW Discharge Note   Patient Details  Name: Rachel Jones MRN: [id number] Date of Birth: August 27, 1933  [facility] CM/SW Contact:  Allena Katz, LCSW Phone Number: 04/17/2023, 10:04 AM   Clinical Narrative:   Pt has [facility]. RN given number for report. Daughter donna notified. Medical necessity printed to unit. Tiffany with [facility] notified. DNR signed and on chart.    Final next level of care: Skilled Nursing Facility Barriers to Discharge: Barriers Resolved   Patient Goals and CMS Choice CMS Medicare.gov Compare Post Acute Care list provided to:: Patient    Discharge Placement                Patient chooses bed at: [facility] Patient to be transferred to facility by: [facility] Name of family member notified: Lupita Leash Patient and family notified of of transfer: 04/17/23  Discharge Plan and Services Additional resources added to the After Visit Summary for       Post Acute Care Choice: Skilled Nursing Facility                               Social Determinants of Health (SDOH) Interventions SDOH Screenings   Food Insecurity: No Food Insecurity (04/13/2023)  Housing: Low Risk  (04/13/2023)  Transportation Needs: No Transportation Needs (04/13/2023)  Utilities: Not At Risk (04/13/2023)  Tobacco Use: Low Risk  (04/13/2023)     Readmission Risk Interventions     No data to display

## 2023-04-17 NOTE — NC FL2 (Signed)
[place] MEDICAID FL2 LEVEL OF CARE FORM     IDENTIFICATION  Patient Name: Rachel Jones Birthdate: 15-Mar-1934 Sex: female Admission Date (Current Location): 04/13/2023  [facility] and [place] Number:  Chiropodist and Address:  [facility], [address] [place], [place] [address]      Provider Number: [id number]  Attending Physician Name and Address:  Lurene Shadow, MD  Relative Name and Phone Number:  Dereck Ligas (Daughter)  [phone] (    Current Level of Care: Hospital Recommended Level of Care:  (LTC) Prior Approval Number:    Date Approved/Denied:   PASRR Number:    Discharge Plan: SNF    Current Diagnoses: Patient Active Problem List   Diagnosis Date Noted   Toxic metabolic encephalopathy 04/16/2023   Dementia without behavioral disturbance (HCC) 04/14/2023   UTI (urinary tract infection) 04/13/2023   Esophageal dysphagia 09/01/2022   Hiatal hernia without gangrene or obstruction 09/01/2022   Personal history of other malignant neoplasm of skin 04/28/2022   Hypercalcemia 09/12/2020   Iron deficiency anemia 09/12/2020   Acute metabolic encephalopathy 09/12/2020   CAD (coronary artery disease) 09/12/2020   Hypomagnesemia 09/12/2020   Osteoarthritis of right hip 03/22/2020   Closed fracture of intracapsular section of femur (HCC) 03/22/2020   Resides in skilled nursing facility 03/19/2020   Age-related osteoporosis with current pathological fracture with routine healing 01/08/2020   Closed right hip fracture (HCC) 01/02/2020   Chronic anticoagulation    Benign hypertensive kidney disease with chronic kidney disease 05/06/2019   Proteinuria 05/06/2019   Secondary hyperparathyroidism of renal origin (HCC) 05/06/2019   Chronic deep vein thrombosis (DVT) of proximal vein of lower extremity (HCC) 02/06/2019   History of DVT (deep vein thrombosis) 02/06/2019   B12 deficiency 10/23/2017   Hydronephrosis 11/18/2016    Gout 11/18/2016   Cervical cancer (HCC) 11/18/2016   Carotid stenosis 04/04/2016   Primary cancer of upper inner quadrant of right female breast (HCC) 04/04/2016   Lymphedema 04/04/2016   Chronic venous insufficiency 04/04/2016   Chest tightness 02/15/2016   Obstructive sleep apnea syndrome 08/13/2015   SOB (shortness of breath) 12/18/2014   Chronic fatigue 07/09/2014   CKD (chronic kidney disease), stage IV (HCC) 05/09/2014   Atypical chest pain 05/09/2014   Chronic tension-type headache, intractable 03/06/2014   Headache 02/06/2014   Facial numbness 02/06/2014   Rheumatoid arthritis involving multiple sites with positive rheumatoid factor (HCC) 11/01/2013   Peripheral edema 11/01/2013   Post herpetic neuralgia 11/01/2013   Hyperlipidemia 11/01/2013   HTN (hypertension) 11/01/2013   History of recurrent UTIs 11/01/2013   H/O: hysterectomy 11/01/2013   GERD (gastroesophageal reflux disease) 11/01/2013   Chronic headache 11/01/2013   Anemia 11/01/2013    Orientation RESPIRATION BLADDER Height & Weight     Self, Time, Situation, Place  Normal Incontinent Weight: 155 lb (70.3 kg) Height:  5\' 3"  (160 cm)  BEHAVIORAL SYMPTOMS/MOOD NEUROLOGICAL BOWEL NUTRITION STATUS      Incontinent    AMBULATORY STATUS COMMUNICATION OF NEEDS Skin     Verbally Normal                       Personal Care Assistance Level of Assistance              Functional Limitations Info             SPECIAL CARE FACTORS FREQUENCY  Contractures Contractures Info: Not present    Additional Factors Info  Code Status, Isolation Precautions, Allergies Code Status Info: DNR Allergies Info: Cefuroxime Axetil  Allopurinol  Codeine  Sulfa Antibiotics     Isolation Precautions Info: MRSA     Current Medications (04/17/2023):  This is the current hospital active medication list Current Facility-Administered Medications  Medication Dose Route Frequency Provider  Last Rate Last Admin   acetaminophen (TYLENOL) tablet 1,000 mg  1,000 mg Oral Q8H PRN Manuela Schwartz, NP   1,000 mg at 04/14/23 0825   acidophilus (RISAQUAD) capsule 1 capsule  1 capsule Oral Daily Orson Aloe, RPH   1 capsule at 04/17/23 0941   alum & mag hydroxide-simeth (MAALOX/MYLANTA) 200-200-20 MG/5ML suspension 30 mL  30 mL Oral Q4H PRN Mikey College T, MD       amoxicillin (AMOXIL) capsule 250 mg  250 mg Oral Q12H Lurene Shadow, MD   250 mg at 04/17/23 0941   apixaban (ELIQUIS) tablet 2.5 mg  2.5 mg Oral BID Mikey College T, MD   2.5 mg at 04/17/23 0941   famotidine (PEPCID) tablet 20 mg  20 mg Oral QHS Mikey College T, MD   20 mg at 04/16/23 2115   loperamide (IMODIUM) capsule 4 mg  4 mg Oral PRN Mikey College T, MD       metoprolol tartrate (LOPRESSOR) tablet 25 mg  25 mg Oral BID Mikey College T, MD   25 mg at 04/17/23 0941   ondansetron (ZOFRAN) tablet 4 mg  4 mg Oral Q6H PRN Emeline General, MD       Or   ondansetron [facility]) injection 4 mg  4 mg Intravenous Q6H PRN Emeline General, MD       Oral care mouth rinse  15 mL Mouth Rinse PRN Mikey College T, MD       pantoprazole (PROTONIX) EC tablet 40 mg  40 mg Oral Daily Mikey College T, MD   40 mg at 04/17/23 0940   polyethylene glycol (MIRALAX / GLYCOLAX) packet 17 g  17 g Oral Daily PRN Lurene Shadow, MD   17 g at 04/14/23 1442   pravastatin (PRAVACHOL) tablet 20 mg  20 mg Oral q1800 Mikey College T, MD   20 mg at 04/16/23 1727   senna-docusate (Senokot-S) tablet 1 tablet  1 tablet Oral QHS Lurene Shadow, MD   1 tablet at 04/16/23 2111   simethicone (MYLICON) chewable tablet 80 mg  80 mg Oral Q6H PRN Emeline General, MD   80 mg at 04/14/23 1115   sodium bicarbonate tablet 650 mg  650 mg Oral BID Lurene Shadow, MD   650 mg at 04/17/23 0940     Discharge Medications: Please see discharge summary for a list of discharge medications.    STOP taking these medications     Baclofen 5 MG Tabs    methocarbamol 750 MG tablet Commonly known  as: ROBAXIN           TAKE these medications     acetaminophen 500 MG tablet Commonly known as: TYLENOL Take 1,000 mg by mouth 3 (three) times daily. May take an additional 1000 mgs as needed for pain    alum & mag hydroxide-simeth 200-200-20 MG/5ML suspension Commonly known as: MAALOX/MYLANTA Take 30 mLs by mouth every 4 (four) hours as needed for indigestion.    amoxicillin 250 MG capsule Commonly known as: AMOXIL Take 1 capsule (250 mg total) by mouth every 12 (twelve) hours for 3  days.    apixaban 2.5 MG Tabs tablet Commonly known as: ELIQUIS Take 1 tablet by mouth 2 (two) times a day.    famotidine 20 MG tablet Commonly known as: PEPCID Take 20 mg by mouth every 12 (twelve) hours.    ferrous gluconate 324 MG tablet Commonly known as: FERGON Take 324 mg by mouth every other day.    fluticasone 50 MCG/ACT nasal spray Commonly known as: FLONASE Place 1 spray into both nostrils daily.    furosemide 20 MG tablet Commonly known as: LASIX Take 20 mg by mouth every other day.    guaiFENesin 100 MG/5ML liquid Commonly known as: ROBITUSSIN Take 5 mLs by mouth every 12 (twelve) hours as needed for cough or to loosen phlegm.    loperamide 2 MG capsule Commonly known as: IMODIUM Take 4 mg by mouth as needed for diarrhea or loose stools.    lovastatin 20 MG tablet Commonly known as: MEVACOR Take 20 mg by mouth daily.    Magnesium 200 MG Tabs Take 200 mg by mouth daily.    metoprolol tartrate 25 MG tablet Commonly known as: LOPRESSOR Take 50 mg by mouth daily.    multivitamin capsule Take 1 capsule by mouth daily.    nitroGLYCERIN 0.4 MG SL tablet Commonly known as: NITROSTAT Place 0.4 mg under the tongue every 5 (five) minutes as needed.    omeprazole 20 MG capsule Commonly known as: PRILOSEC Take 1 capsule (20 mg total) by mouth 2 (two) times daily before a meal.    Probiotic Advanced Caps Take 1 capsule by mouth daily.    simethicone 125 MG chewable  tablet Commonly known as: MYLICON Chew 125 mg by mouth every 6 (six) hours as needed.    sodium bicarbonate 650 MG tablet Take 1 tablet (650 mg total) by mouth 2 (two) times daily.    triamcinolone cream 0.1 % Commonly known as: KENALOG Apply 1 Application topically daily.    Vitamin D3 25 MCG (1000 UT) Caps Take 1,000 Units by mouth daily.    zinc gluconate 50 MG tablet Take 50 mg by mouth daily.    Relevant Imaging Results:  Relevant Lab Results:   Additional Information SS# [id number]  Allena Katz, LCSW

## 2023-07-22 DEATH — deceased
